# Patient Record
Sex: Male | Born: 1935 | Race: White | Hispanic: No | Marital: Married | Smoking: Former smoker
Health system: Southern US, Community
[De-identification: ages and names within clinical notes are randomized; demographics above are authoritative.]

## PROBLEM LIST (undated history)

## (undated) DIAGNOSIS — IMO0001 Reserved for inherently not codable concepts without codable children: Secondary | ICD-10-CM

## (undated) DIAGNOSIS — I4891 Unspecified atrial fibrillation: Secondary | ICD-10-CM

## (undated) DIAGNOSIS — F419 Anxiety disorder, unspecified: Secondary | ICD-10-CM

## (undated) DIAGNOSIS — I739 Peripheral vascular disease, unspecified: Secondary | ICD-10-CM

## (undated) DIAGNOSIS — E119 Type 2 diabetes mellitus without complications: Secondary | ICD-10-CM

## (undated) DIAGNOSIS — I214 Non-ST elevation (NSTEMI) myocardial infarction: Secondary | ICD-10-CM

## (undated) DIAGNOSIS — N400 Enlarged prostate without lower urinary tract symptoms: Secondary | ICD-10-CM

## (undated) DIAGNOSIS — I472 Ventricular tachycardia, unspecified: Secondary | ICD-10-CM

## (undated) DIAGNOSIS — I2510 Atherosclerotic heart disease of native coronary artery without angina pectoris: Secondary | ICD-10-CM

## (undated) DIAGNOSIS — Z95810 Presence of automatic (implantable) cardiac defibrillator: Secondary | ICD-10-CM

## (undated) DIAGNOSIS — I5020 Unspecified systolic (congestive) heart failure: Secondary | ICD-10-CM

## (undated) DIAGNOSIS — C3490 Malignant neoplasm of unspecified part of unspecified bronchus or lung: Secondary | ICD-10-CM

## (undated) DIAGNOSIS — M25519 Pain in unspecified shoulder: Secondary | ICD-10-CM

## (undated) DIAGNOSIS — C3411 Malignant neoplasm of upper lobe, right bronchus or lung: Secondary | ICD-10-CM

## (undated) DIAGNOSIS — I10 Essential (primary) hypertension: Secondary | ICD-10-CM

## (undated) DIAGNOSIS — Z9289 Personal history of other medical treatment: Secondary | ICD-10-CM

## (undated) DIAGNOSIS — I429 Cardiomyopathy, unspecified: Secondary | ICD-10-CM

## (undated) HISTORY — PX: CORONARY ARTERY BYPASS GRAFT: SHX141

## (undated) HISTORY — PX: TONSILLECTOMY: SUR1361

## (undated) HISTORY — PX: ABDOMINAL AORTIC ANEURYSM REPAIR: SUR1152

## (undated) HISTORY — PX: CARDIAC CATHETERIZATION: SHX172

## (undated) HISTORY — DX: Malignant neoplasm of upper lobe, right bronchus or lung: C34.11

## (undated) HISTORY — PX: CATARACT EXTRACTION: SUR2

---

## 2000-08-19 ENCOUNTER — Encounter: Payer: Self-pay | Admitting: Vascular Surgery

## 2000-08-20 ENCOUNTER — Ambulatory Visit: Admission: RE | Admit: 2000-08-20 | Discharge: 2000-08-20 | Payer: Self-pay | Admitting: Vascular Surgery

## 2000-08-25 ENCOUNTER — Encounter (INDEPENDENT_AMBULATORY_CARE_PROVIDER_SITE_OTHER): Payer: Self-pay | Admitting: Specialist

## 2000-08-25 ENCOUNTER — Inpatient Hospital Stay: Admission: RE | Admit: 2000-08-25 | Discharge: 2000-08-29 | Payer: Self-pay | Admitting: Vascular Surgery

## 2000-08-25 ENCOUNTER — Encounter: Payer: Self-pay | Admitting: Vascular Surgery

## 2000-08-26 ENCOUNTER — Encounter: Payer: Self-pay | Admitting: Vascular Surgery

## 2000-08-27 ENCOUNTER — Encounter: Payer: Self-pay | Admitting: Vascular Surgery

## 2000-08-29 ENCOUNTER — Encounter: Payer: Self-pay | Admitting: Vascular Surgery

## 2009-10-05 ENCOUNTER — Ambulatory Visit (HOSPITAL_COMMUNITY): Admission: RE | Admit: 2009-10-05 | Discharge: 2009-10-05 | Payer: Self-pay | Admitting: Ophthalmology

## 2009-10-18 ENCOUNTER — Ambulatory Visit (HOSPITAL_COMMUNITY): Admission: RE | Admit: 2009-10-18 | Discharge: 2009-10-18 | Payer: Self-pay | Admitting: Ophthalmology

## 2011-03-20 LAB — GLUCOSE, CAPILLARY: Glucose-Capillary: 170 mg/dL — ABNORMAL HIGH (ref 70–99)

## 2011-03-21 LAB — BASIC METABOLIC PANEL
BUN: 18 mg/dL (ref 6–23)
CO2: 27 mEq/L (ref 19–32)
Chloride: 104 mEq/L (ref 96–112)
Creatinine, Ser: 0.94 mg/dL (ref 0.4–1.5)
Glucose, Bld: 183 mg/dL — ABNORMAL HIGH (ref 70–99)

## 2011-03-21 LAB — GLUCOSE, CAPILLARY: Glucose-Capillary: 170 mg/dL — ABNORMAL HIGH (ref 70–99)

## 2011-03-21 LAB — HEMOGLOBIN AND HEMATOCRIT, BLOOD: Hemoglobin: 16.5 g/dL (ref 13.0–17.0)

## 2011-05-03 NOTE — Op Note (Signed)
[facility]  Patient:    George Pollard, George Pollard                    MRN: [id number] Proc. Date: 08/20/00 Adm. Date:  [id number] Attending:  Alyson Locket CC:         Thomes Dinning, M.D., [address] [place], [place] [address]                           Operative Report  PREOPERATIVE DIAGNOSES:  Abdominal aortic aneurysm.  POSTOPERATIVE DIAGNOSES:  Abdominal aortic aneurysm.  PROCEDURE:  Aortogram with bilateral lower extremity runoff.  SURGEON:  Dr. Tawanna Cooler Early.  ANESTHESIA:  1% lidocaine local, 2 mg IV Versed sedation.  COMPLICATIONS:  None.  DISPOSITION:  To holding area stable.  DESCRIPTION OF PROCEDURE:  The patient was taken to the [facility], placed in supine position where the area of both groins were prepped and draped in the usual sterile fashion. Using local anesthesia and a single wall stick, the right common femoral artery was entered and a guidewire was passed up to the level of the suprarenal aorta. A 5 French sheath was passed over the guidewire and a pigtail catheter was passed to the level of the suprarenal aorta. AP projections at this level revealed widely patent main left and right renal arteries. The patient also had a large lower pole renal artery arising from the aneurysm. There was a short infrarenal neck. Lateral projection at this same level confirmed these findings with a short infrarenal neck. The pigtail catheter was then withdrawn down to the aortic bifurcation. Again the large aneurysm was seen. There was ectasia of the common iliac arteries bilaterally with no aneurysms seen by arteriogram. The pigtail catheter was then exchanged for am IMA catheter. The left common iliac artery was selectively catheterized and the left iliac injection was undertaken. This again revealed ectasia of the common iliac artery on the left. There was a normal external iliac artery on the left and normal common, superficial femoral and  profunda femoris arteries. There was mild irregularity at the adductor canal on the left at the superficial femoral artery. The popliteal artery was widely patent and there was normal 3-vessel runoff on the left. The IMA catheter was then removed and runoff was obtained in the right leg via the right femoral sheath. This revealed widely patent external iliac, femoral and common femoral artery. There was a widely patent profunda and superficial femoral artery. The popliteal artery was widely patent and there was normal 3-vessel runoff on the right as well. The patient tolerated the procedure without difficulty. The sheath was removed and pressure was held.  FINDINGS: 1. Infrarenal abdominal aortic aneurysm. 2. 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 3. Common iliac artery ectasia bilaterally. 4. Widely patent external iliac, common femoral, superficial femoral and    3 vessel runoff bilaterally.  DD:  08/20/00 TD:  08/20/00 Job: [id number]

## 2011-05-03 NOTE — Discharge Summary (Signed)
[facility]  Patient:    George Pollard, George Pollard                    MRN: [id number] Adm. Date:  [id number] Disc. Date: [id number] Attending:  Alyson Locket Dictator:   Loura Pardon, P.A. CC:         Kirstie Peri, M.D.   Discharge Summary  DATE OF BIRTH:  [birth year]  FINAL DIAGNOSIS:  Abdominal aortic aneurysm, asymptomatic, measuring 6.2 x 4.8 cm and 9.2 cm long.  SECONDARY DIAGNOSES: 1. History of chronic atrial fibrillation on Coumadin therapy. 2. Coronary artery disease status post coronary artery bypass graft surgery    in 1997. 3. Hypertension. 4. Hyperlipidemia with hyperglyceridemia. 5. Chronic obstructive pulmonary disease. 6. Ejection fraction 50% per Cardiolite August 15, 2000. 7. Adult onset diabetes mellitus, diet controlled. 8. Status post angiogram August 20, 2000. 9. Status post left heart catheterization July 1997.  PROCEDURES: August 25, 2000, resection and grafting of abdominal aortic aneurysm, placement of 18 x 9 mm Dacron Hemashield graft and aorta to bilateral common iliac artery bypass placed with reimplantation of the lower left renal artery.  Attention surgeon, Dr. Gretta Began.  DISCHARGE DISPOSITION:  Mr. George Pollard is a suitable candidate for discharge on postoperative day #4.  He has made splendid progress in the postoperative period.  He was relieved of all supplemental oxygen by postoperative day #3.  He was started on clear liquids on postoperative day #2.  He tolerated that.  He was started on a regular diet on postoperative day #3 which he tolerated.  He had a bowel movement on postoperative day #2, and he continued to have regular bowel movements throughout the postoperative period then.  His abdomen was soft, nondistended.  He was ambulating independently.  He did have a transient burst of atrial fibrillation on postoperative day #1.  This converted when he was placed on IV Cardizem and then started on  his Toprol XL 50 mg daily dosage.  His Coumadin was restarted when he was taken oral diet well on postoperative day #3 as were his other medications.  His incision is healing nicely without any evidence of erythema or drainage.  Ankle-brachial indices were performed on postoperative day #1 showing 0.187 on the right and greater than 1.0 on the left.  DISCHARGE MEDICATIONS: 1. Tylox 1 to 2 tablets p.o. q.4h. p.r.n. pain. 2. Toprol XL 50 mg daily. 3. Hytrin 1 mg at bedtime daily. 4. Zocor 10 mg at bedtime daily. 5. Coumadin 7.5 mg Tuesday, Thursday, Saturday, and Sunday; 10 mg    Monday, Wednesday, Friday.  DISCHARGE ACTIVITY:  Ambulation as tolerated.  DISCHARGE DIET:  Low sodium, low cholesterol.  WOUND CARE:  He may shower daily, keeping his incision clean and dry.  He is asked not to lift any weight more than 10 pounds nor to drive until seen by Dr. Arbie Cookey.  He will have an office visit with [facility] Friday, September 21, at 10:30 in the morning to have staples removed.  At that time, and office visit with Dr. Gretta Began will be scheduled.  BRIEF HISTORY:  George Pollard is a 77 year old male with a known history of abdominal aortic aneurysm.  This was found in 1997 at the time of his coronary artery bypass graft surgery.  Last study in August 2001 revealed an increase in his abdominal aortic aneurysm to 6.2 x 4.8 cm and a length of 9.2 cm. George Pollard has  remained asymptomatic.  He underwent a CT for further evaluation of the aorta confirming the findings.  He was not an adequate candidate for stent graft repair, and he does have a large accessory renal artery just above the beginning of the aneurysm.  He underwent abdominal angiogram on September 5 in preparation for the surgery.  HOSPITAL COURSE:  George Pollard presented to [facility] on September 10.  He underwent resection and grafting of his abdominal aortic  aneurysm with placement of 18 x 9 mm aorta to bilateral common iliac artery bypass.  He also had reimplantation of his lower left renal artery.  Dr. Tawanna Cooler Early was the surgeon.  The patient tolerated the procedure well and was extubated on the day of surgery.  He had good output, strong pedal pulses.  On postoperative day #1, he experienced cardiac dysrhythmia with atrial fibrillation with rates in 100 to 110 range.  He was started on a Cardizem drip and converted to sinus rhythm.  He was started on Toprol XL when he was taking oral nourishment, and the Cardizem drip was discontinued on postoperative day #3.  Ankle-brachial indices were taken on postoperative day #1 with findings as dictated above.  On postoperative day #2, he had his first bowel movement.  He was achieving 97% oxygen saturation on 4 liters of nasal cannula.  His postoperative laboratories were within normal limits.  Hemoglobin was 14.3, hematocrit 48%. He was started on clear liquids as well as Toprol XL.  On postoperative day #3, he was started on a regular diet.  He did not require any oxygen supplementation and was achieving 96% oxygen saturation on room air.  Hemoglobin 13.7, hematocrit 39%.  As mentioned above, he was started on a regular diet which he tolerated.  On postoperative day #4, he was ambulating well, not requiring oxygen.  He continued to have daily bowel movements and in essence asked to go home.  He was judged a suitable candidate to go home.  His pain was controlled well with Tylox, and he had had no abdominal discomfort in the postoperative period.  He goes home on the medications and followup as dictated. DD:  08/29/00 TD:  09/01/00 Job: [id number]

## 2011-05-03 NOTE — H&P (Signed)
[facility]  Patient:    George Pollard, George Pollard                    MRN: [id number] Adm. Date:  [id number] Disc. Date: [id number] Attending:  Alyson Locket Dictator:   Marlowe Kays, P.A.                         History and Physical  DATE OF BIRTH:  28-Feb-1933  CHIEF COMPLAINT:  Abdominal aortic aneurysm.  HISTORY OF PRESENT ILLNESS:  George Pollard is a pleasant 77 year old white married male with known history of abdominal aortic aneurysm since 1997, followed up by ultrasound. In the year 2000, the maximal diameter had reached 5 cm. However, his last study of August 2001 revealed an increase of this abdominal aortic aneurysm to 6.2 x 4.8 cm, and a length of 9.2 cm. The patient remains asymptomatic. He underwent a CT for further evaluation of the aorta which confirmed the findings. He does not have adequate neck length of his aortic neck for stent graft repair and also has a large accessory renal artery just above the beginning of the aneurysm. He underwent abdominal angiogram on August 20, 2000 (no reports are available as of August 22, 2000). The patient is scheduled for surgery on August 25, 2000. He denies any abdominal pain, nausea, vomiting, diarrhea, no hematochezia, melena, fever or chills. No back pain. No constipation, no leg pain or claudication symptoms, no hematemesis, no peripheral edema. No shortness of breath, dyspnea on exertion, paroxysmal nocturnal dyspnea, dysuria or hematuria.  PAST MEDICAL HISTORY: 1. Abdominal aortic aneurysm. 2. History of chronic atrial fibrillation on Coumadin therapy. 3. Coronary artery disease status post CABG. 4. Hypertension. 5. Hypercholesterolemia (high triglyceridemia). 6. Chronic obstructive pulmonary disease. 7. Ejection fraction 50% as per cardiolite test August 15, 2000. 8. Adult onset diabetes mellitus, diet controlled.  PAST SURGICAL HISTORY: 1. Status post angiogram August 20, 2000. 2. Status post ______. 3. Status post CABG x 3 in 1997. 4. Status post cardiac catheterization July 1997.  MEDICATIONS: 1. Coumadin 7.5 mg on Tuesday, Thursday, Saturday and Sunday and 10 mg on    Monday, Wednesday, and Friday. 2. Toprol XL 50 mg p.o. q.d. 3. Hytrin 1 mg p.o. q.d. 4. Zocor 10 mg p.o. q.d.  ALLERGIES:  No known drug allergies.  REVIEW OF SYSTEMS:  See HPI and past medical history for significant positives otherwise the patient denies any kidney disease, asthma, TIA, CVA, amaurosis fugax, syncope, presyncope. No angina, history of PE or DVT. No GERD symptoms, CHF, hematuria or dysuria.  FAMILY HISTORY:  Mother died, had a history of MI and diabetes. Father dead with a history of diabetes and Alzheimers. One sister deceased with a history of diabetes.  SOCIAL HISTORY:  Married, 4 children, he is retired. He quit 1 pack a day of cigarette abuse for 4 years. He denies any alcohol intake.  PHYSICAL EXAMINATION:  GENERAL:  Well-developed, moderately obese white male in no acute distress, alert and oriented x 3.  VITAL SIGNS:  Blood pressure 140/90, pulse 68 irregular, respirations 18.  HEENT:  Normocephalic, atraumatic. PERRLA. EOMI. There is very mild cataract formation on funduscopic exam. The patient wears dentures.  NECK:  Supple, no JVD, bruits, thyromegaly or lymphadenopathy.  CHEST:  Symmetrical on inspirations. No wheezes, rhonchi or rales. Respirations nonlabored. No axillary or supraclavicular lymphadenopathy.  CARDIOVASCULAR:  Irregularly irregular rate and rhythm without murmurs, rubs or gallops.  ABDOMEN:  Soft, nontender. Bowel sounds x 4. No hepatosplenomegaly, masses palpable or abdominal bruits. The abdomen is significantly obese, which may mask some abdominal bruits or masses.  GU/RECTAL:  Deferred.  EXTREMITIES:  No cyanosis, clubbing or edema.  SKIN:  Shows no ulcerations, normal hair pattern, warm temperature.  PERIPHERAL  PULSES:  Carotids 2+ bilaterally without bruits. Femoral 2+ bilaterally without bruits. Popliteal, dorsalis pedis and posterior tibialis 2+ bilaterally.  NEUROLOGIC:  Grossly normal. Normal gait. DTRs 2+ bilaterally. Muscle strength 5/5.  ASSESSMENT/PLAN:  A 77 year old white male with known history of abdominal aortic aneurysm since 1997, who will undergo ______ of the abdominal aortic aneurysm by Dr. Arbie Cookey. Dr. Arbie Cookey has seen and evaluated this patient prior to the admission and has explained the risks and benefits involved in this procedure and the patient has agreed to continue. DD:  08/22/00 TD:  08/22/00 Job: [id number]

## 2012-10-19 ENCOUNTER — Inpatient Hospital Stay (HOSPITAL_COMMUNITY)
Admission: EM | Admit: 2012-10-19 | Discharge: 2012-10-29 | DRG: 246 | Disposition: A | Discharge status: Home / Self Care | Payer: Medicare Other | Source: Other Acute Inpatient Hospital | Attending: Cardiology | Admitting: Cardiology

## 2012-10-19 ENCOUNTER — Encounter (HOSPITAL_COMMUNITY): Payer: Self-pay | Admitting: Cardiology

## 2012-10-19 DIAGNOSIS — I2589 Other forms of chronic ischemic heart disease: Secondary | ICD-10-CM | POA: Diagnosis present

## 2012-10-19 DIAGNOSIS — Y9210 Unspecified residential institution as the place of occurrence of the external cause: Secondary | ICD-10-CM | POA: Diagnosis not present

## 2012-10-19 DIAGNOSIS — I25810 Atherosclerosis of coronary artery bypass graft(s) without angina pectoris: Secondary | ICD-10-CM | POA: Diagnosis present

## 2012-10-19 DIAGNOSIS — E119 Type 2 diabetes mellitus without complications: Secondary | ICD-10-CM | POA: Diagnosis present

## 2012-10-19 DIAGNOSIS — I4891 Unspecified atrial fibrillation: Secondary | ICD-10-CM | POA: Diagnosis present

## 2012-10-19 DIAGNOSIS — I2582 Chronic total occlusion of coronary artery: Secondary | ICD-10-CM | POA: Diagnosis present

## 2012-10-19 DIAGNOSIS — Z955 Presence of coronary angioplasty implant and graft: Secondary | ICD-10-CM

## 2012-10-19 DIAGNOSIS — I10 Essential (primary) hypertension: Secondary | ICD-10-CM | POA: Diagnosis present

## 2012-10-19 DIAGNOSIS — N400 Enlarged prostate without lower urinary tract symptoms: Secondary | ICD-10-CM | POA: Diagnosis present

## 2012-10-19 DIAGNOSIS — Y849 Medical procedure, unspecified as the cause of abnormal reaction of the patient, or of later complication, without mention of misadventure at the time of the procedure: Secondary | ICD-10-CM | POA: Diagnosis not present

## 2012-10-19 DIAGNOSIS — IMO0002 Reserved for concepts with insufficient information to code with codable children: Secondary | ICD-10-CM | POA: Diagnosis not present

## 2012-10-19 DIAGNOSIS — Z7901 Long term (current) use of anticoagulants: Secondary | ICD-10-CM

## 2012-10-19 DIAGNOSIS — Z79899 Other long term (current) drug therapy: Secondary | ICD-10-CM

## 2012-10-19 DIAGNOSIS — I5023 Acute on chronic systolic (congestive) heart failure: Secondary | ICD-10-CM | POA: Diagnosis not present

## 2012-10-19 DIAGNOSIS — I214 Non-ST elevation (NSTEMI) myocardial infarction: Secondary | ICD-10-CM

## 2012-10-19 DIAGNOSIS — I2510 Atherosclerotic heart disease of native coronary artery without angina pectoris: Secondary | ICD-10-CM | POA: Diagnosis present

## 2012-10-19 DIAGNOSIS — I255 Ischemic cardiomyopathy: Secondary | ICD-10-CM | POA: Diagnosis present

## 2012-10-19 DIAGNOSIS — I509 Heart failure, unspecified: Secondary | ICD-10-CM | POA: Diagnosis not present

## 2012-10-19 HISTORY — DX: Essential (primary) hypertension: I10

## 2012-10-19 HISTORY — DX: Benign prostatic hyperplasia without lower urinary tract symptoms: N40.0

## 2012-10-19 HISTORY — DX: Non-ST elevation (NSTEMI) myocardial infarction: I21.4

## 2012-10-19 HISTORY — DX: Unspecified atrial fibrillation: I48.91

## 2012-10-19 HISTORY — DX: Peripheral vascular disease, unspecified: I73.9

## 2012-10-19 HISTORY — DX: Unspecified systolic (congestive) heart failure: I50.20

## 2012-10-19 HISTORY — DX: Atherosclerotic heart disease of native coronary artery without angina pectoris: I25.10

## 2012-10-19 HISTORY — DX: Type 2 diabetes mellitus without complications: E11.9

## 2012-10-19 MED ORDER — SODIUM CHLORIDE 0.9 % IV SOLN
INTRAVENOUS | Status: DC
  Administered 2012-10-20: 18:00:00 via INTRAVENOUS

## 2012-10-19 MED ORDER — TERAZOSIN HCL 1 MG PO CAPS
1.0000 mg | ORAL_CAPSULE | Freq: Every day | ORAL | Status: DC
  Administered 2012-10-20 – 2012-10-24 (×6): 1 mg via ORAL
  Filled 2012-10-19 (×7): qty 1

## 2012-10-19 MED ORDER — ACETAMINOPHEN 325 MG PO TABS
650.0000 mg | ORAL_TABLET | ORAL | Status: DC | PRN
  Administered 2012-10-20 – 2012-10-21 (×4): 650 mg via ORAL
  Filled 2012-10-19 (×4): qty 2

## 2012-10-19 MED ORDER — INSULIN ASPART 100 UNIT/ML ~~LOC~~ SOLN
0.0000 [IU] | SUBCUTANEOUS | Status: DC
  Administered 2012-10-20 (×2): 1 [IU] via SUBCUTANEOUS
  Administered 2012-10-20 (×3): 2 [IU] via SUBCUTANEOUS
  Administered 2012-10-21: 4 [IU] via SUBCUTANEOUS

## 2012-10-19 MED ORDER — ONDANSETRON HCL 4 MG/2ML IJ SOLN: 4.0000 mg | Freq: Four times a day (QID) | INTRAMUSCULAR | Status: DC | PRN

## 2012-10-19 MED ORDER — SODIUM CHLORIDE 0.9 % IV SOLN: 250.0000 mL | INTRAVENOUS | Status: DC | PRN

## 2012-10-19 MED ORDER — ASPIRIN 81 MG PO CHEW
324.0000 mg | CHEWABLE_TABLET | ORAL | Status: AC
  Administered 2012-10-20: 324 mg via ORAL
  Filled 2012-10-19: qty 4

## 2012-10-19 MED ORDER — SODIUM CHLORIDE 0.9 % IJ SOLN
3.0000 mL | Freq: Two times a day (BID) | INTRAMUSCULAR | Status: DC
  Administered 2012-10-20 – 2012-10-21 (×4): 3 mL via INTRAVENOUS

## 2012-10-19 MED ORDER — METOPROLOL TARTRATE 25 MG PO TABS
25.0000 mg | ORAL_TABLET | Freq: Two times a day (BID) | ORAL | Status: DC
  Administered 2012-10-20 – 2012-10-21 (×4): 25 mg via ORAL
  Filled 2012-10-19 (×6): qty 1

## 2012-10-19 MED ORDER — ASPIRIN EC 81 MG PO TBEC
81.0000 mg | DELAYED_RELEASE_TABLET | Freq: Every day | ORAL | Status: DC
  Administered 2012-10-21 – 2012-10-29 (×8): 81 mg via ORAL
  Filled 2012-10-19 (×9): qty 1

## 2012-10-19 MED ORDER — ATORVASTATIN CALCIUM 80 MG PO TABS
80.0000 mg | ORAL_TABLET | Freq: Every day | ORAL | Status: DC
  Administered 2012-10-20 – 2012-10-28 (×9): 80 mg via ORAL
  Filled 2012-10-19 (×10): qty 1

## 2012-10-19 MED ORDER — NITROGLYCERIN 0.4 MG SL SUBL: 0.4000 mg | SUBLINGUAL_TABLET | SUBLINGUAL | Status: DC | PRN

## 2012-10-19 MED ORDER — SODIUM CHLORIDE 0.9 % IJ SOLN: 3.0000 mL | INTRAMUSCULAR | Status: DC | PRN

## 2012-10-19 NOTE — Progress Notes (Signed)
ANTICOAGULATION CONSULT NOTE - Initial Consult  Pharmacy Consult for heparin Indication: chest pain/ACS  No Known Allergies  Patient Measurements: Height: 5\' 10"  (177.8 cm) Weight: 235 lb 3.7 oz (106.7 kg) IBW/kg (Calculated) : 73  Heparin Dosing Weight: 96 kg  Vital Signs: Temp: 99.7 F (37.6 C) (11/04 2130) Temp src: Oral (11/04 2130) BP: 130/81 mmHg (11/04 2200) Pulse Rate: 114  (11/04 2200)  Labs: No results found for this basename: HGB:2,HCT:3,PLT:3,APTT:3,LABPROT:3,INR:3,HEPARINUNFRC:3,CREATININE:3,CKTOTAL:3,CKMB:3,TROPONINI:3 in the last 72 hours  Estimated Creatinine Clearance: 80.5 ml/min (by C-G formula based on Cr of 0.94).   Medical History: Past Medical History  Diagnosis Date  . CAD (coronary artery disease)   . Atrial fibrillation   . Hypertension   . BPH (benign prostatic hyperplasia)     Medications:  Scheduled:    . aspirin  324 mg Oral Pre-Cath  . aspirin EC  81 mg Oral Daily  . atorvastatin  80 mg Oral q1800  . insulin aspart  0-9 Units Subcutaneous Q4H  . metoprolol tartrate  25 mg Oral BID  . sodium chloride  3 mL Intravenous Q12H  . terazosin  1 mg Oral QHS    Assessment: 78 yo male with h/o atrial fibrillation transferred from [facility] with NSTEMI per [facility] cardiology consult. Patient was on Coumadin and INR at [facility] was 2. Patient received phytonadione 2.5mg  orally. Additional baseline labs at [facility] include, SrCr 0.96, Hgb 15.4, Plt 153. Pharmacy consulted to manage heparin. Heparin was started at [facility], and is infusing at 1400 units/hr.   Goal of Therapy:  Heparin level 0.3-0.7 units/ml Monitor platelets by anticoagulation protocol: Yes   Plan:  1. Continue IV heparin at 1400 units/hr. 2. Heparin level at 01:00. 3. Daily CBC, heparin level.   Emeline Gins 10/19/2012,11:23 PM

## 2012-10-19 NOTE — H&P (Signed)
CARDIOLOGY ADMISSION NOTE  Patient ID: George Pollard MRN: [id number] DOB/AGE: February 26, 1933 78 y.o.  Admit date: 10/19/2012 Primary Physician   Dr. Sherryll Burger Primary Cardiologist   Dr. Andee Lineman Chief Complaint    Chest pain.    HPI:    The patient has a history of CAD with CABG in 1997.  He also has trial fibrillation on chronic warfarin therapy.  The patient was admitted to [facility] earlier this am with chest pain.  He was not found to have acute ST changes.  He was in afib with RVR and was treated with Cardizem.  He has some mild lingering chest pain and a second set of troponin was elevated at 34.  Dr. Andee Lineman was consulted and is transferring the patient to [facility] for further evaluation and catheterization.  Prior to transfer bedside echo demonstrated an EF of 35% with severe posterior hypokinesis.  He has been treated with IV heparin and vitamin K given his therapeutic INR.   The patient said the pain started this a.m. It was right of his sternum and radiated across his chest.  Felt like he needed to burp but it wasn't like any previous discomfort that he had before. It was 9/10 at its peak. It lasted for greater than an hour. He wasn't short of breath but he tried to lie down to take deep breaths to improve the discomfort. He did have improvement in the emergency room after treatment with Cardizem and aspirin. He had some mild lingering residual soreness throughout the day but none of the acute discomfort.  Prior to admission he had no recent symptoms other than today. 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 The patient denies any new symptoms such as neck or arm discomfort. There has been no new shortness of breath, PND or orthopnea. There have been no reported palpitations, presyncope or syncope.    Past Medical History  Diagnosis Date  . CAD (coronary artery disease)   . Atrial fibrillation   . Hypertension   . BPH (benign prostatic hyperplasia)     Past Surgical  History  Procedure Date  . Coronary artery bypass graft     1997  . Abdominal aortic aneurysm repair     08/26/2000    Allergies:  None  Medications: (Home) Warfarin Terazosin and 1 mg daily Metoprolol 50 mg daily   History   Social History  . Marital Status: Married    Spouse Name: N/A    Number of Children: N/A  . Years of Education: N/A   Occupational History  . Not on file.   Social History Main Topics  . Smoking status: Not on file  . Smokeless tobacco: Not on file  . Alcohol Use: Not on file  . Drug Use: Not on file  . Sexually Active: Not on file   Other Topics Concern  . Not on file   Social History Narrative  . No narrative on file    Family History  Problem Relation Age of Onset  . Valvular heart disease Daughter     MVP repair    ROS:  Decreased urinary stream.  Otherwise as stated in the HPI and negative for all other systems.   Physical Exam: Blood pressure 126/74, pulse 113, temperature 99.7 F (37.6 C), temperature source Oral, resp. rate 19, height 5\' 10"  (1.778 m), weight 235 lb 3.7 oz (106.7 kg), SpO2 97.00%.  GENERAL:  Well appearing HEENT:  Pupils equal round and reactive, fundi  not visualized, oral mucosa unremarkable NECK:  No jugular venous distention, waveform within normal limits, carotid upstroke brisk and symmetric, no bruits, no thyromegaly LYMPHATICS:  No cervical, inguinal adenopathy LUNGS:  Clear to auscultation bilaterally BACK:  No CVA tenderness CHEST:  Well healed sternotomy scar. HEART:  PMI not displaced or sustained,S1 and S2 within normal limits, no S3, no S4, no clicks, no rubs, no murmurs ABD:  Flat, positive bowel sounds normal in frequency in pitch, no bruits, no rebound, no guarding, no midline pulsatile mass, no hepatomegaly, no splenomegaly, abdominal scar EXT:  2 plus pulses throughout, no edema, no cyanosis no clubbing SKIN:  No rashes no nodules NEURO:  Cranial nerves II through XII grossly intact, motor  grossly intact throughout PSYCH:  Cognitively intact, oriented to person place and time  Labs: Sodium 1S and 4.2, creatinine 0.96, BUN 19, CK/MB(1)98/42, CK/MB (2) 1693/210.6, troponin (1) 0.04, troponin (2) 34.53, BNP 73, INR 2.0, WBC 7.0, hemoglobin 15.4, platelets 153   Radiology:  Chest x-ray: Cardiomegaly with central congestion. Interstitial prominence there chronic changes and/or interstitial edema.  EKG:  Atrial fibrillation, rate 88, left axis deviation, probable old inferior infarct, QTC slightly prolonged, no acute ST-T wave changes.  ASSESSMENT AND PLAN:    NQWMI: Continue heparin and ASA.  Cath in AM pending his INR.  Atrial fibrillation: Resume warfarin at discharge (the patient does not want to switch to another medication.)  Cardiomyopathy: Reduced EF.  However, he currently appears to be euvolemic.  We will continue his beta blocker and start low-dose ACE inhibitor during this admission.  Hyperglycemia This is a new diagnosis. He did have elevated blood sugars in the past medically managed but diabetes resolved with weight loss. We will follow this and consider oral therapy upon discharge.  AAA repair: Routine followup this per Dr. Andee Lineman.    SignedRollene Rotunda 10/19/2012, 10:03 PM   \

## 2012-10-20 ENCOUNTER — Encounter (HOSPITAL_COMMUNITY): Payer: Self-pay | Admitting: *Deleted

## 2012-10-20 ENCOUNTER — Inpatient Hospital Stay (HOSPITAL_COMMUNITY): Payer: Medicare Other

## 2012-10-20 DIAGNOSIS — I2589 Other forms of chronic ischemic heart disease: Secondary | ICD-10-CM

## 2012-10-20 DIAGNOSIS — I4891 Unspecified atrial fibrillation: Secondary | ICD-10-CM

## 2012-10-20 DIAGNOSIS — I255 Ischemic cardiomyopathy: Secondary | ICD-10-CM | POA: Diagnosis present

## 2012-10-20 DIAGNOSIS — I25810 Atherosclerosis of coronary artery bypass graft(s) without angina pectoris: Secondary | ICD-10-CM

## 2012-10-20 DIAGNOSIS — I214 Non-ST elevation (NSTEMI) myocardial infarction: Secondary | ICD-10-CM

## 2012-10-20 DIAGNOSIS — I517 Cardiomegaly: Secondary | ICD-10-CM

## 2012-10-20 LAB — GLUCOSE, CAPILLARY
Glucose-Capillary: 125 mg/dL — ABNORMAL HIGH (ref 70–99)
Glucose-Capillary: 163 mg/dL — ABNORMAL HIGH (ref 70–99)
Glucose-Capillary: 177 mg/dL — ABNORMAL HIGH (ref 70–99)
Glucose-Capillary: 185 mg/dL — ABNORMAL HIGH (ref 70–99)

## 2012-10-20 LAB — CBC
HCT: 43.6 % (ref 39.0–52.0)
Hemoglobin: 14.9 g/dL (ref 13.0–17.0)
MCHC: 34.2 g/dL (ref 30.0–36.0)
RBC: 4.93 MIL/uL (ref 4.22–5.81)

## 2012-10-20 LAB — HEPARIN LEVEL (UNFRACTIONATED): Heparin Unfractionated: 0.19 IU/mL — ABNORMAL LOW (ref 0.30–0.70)

## 2012-10-20 LAB — BASIC METABOLIC PANEL
BUN: 15 mg/dL (ref 6–23)
CO2: 24 mEq/L (ref 19–32)
Chloride: 101 mEq/L (ref 96–112)
Glucose, Bld: 173 mg/dL — ABNORMAL HIGH (ref 70–99)
Potassium: 4 mEq/L (ref 3.5–5.1)
Sodium: 135 mEq/L (ref 135–145)

## 2012-10-20 LAB — LIPID PANEL
LDL Cholesterol: 95 mg/dL (ref 0–99)
Triglycerides: 119 mg/dL (ref <150)

## 2012-10-20 LAB — HEMOGLOBIN A1C
Hgb A1c MFr Bld: 6.9 % — ABNORMAL HIGH (ref <5.7)
Mean Plasma Glucose: 151 mg/dL — ABNORMAL HIGH (ref <117)

## 2012-10-20 LAB — PROTIME-INR
INR: 2.06 — ABNORMAL HIGH (ref 0.00–1.49)
Prothrombin Time: 20.6 seconds — ABNORMAL HIGH (ref 11.6–15.2)

## 2012-10-20 LAB — CK TOTAL AND CKMB (NOT AT ARMC): CK, MB: 75 ng/mL (ref 0.3–4.0)

## 2012-10-20 LAB — TROPONIN I
Troponin I: >20 ng/mL (ref <0.30)
Troponin I: >20 ng/mL (ref <0.30)

## 2012-10-20 MED ORDER — NITROGLYCERIN 2 % TD OINT
1.0000 [in_us] | TOPICAL_OINTMENT | Freq: Four times a day (QID) | TRANSDERMAL | Status: DC
  Administered 2012-10-20 (×4): 1 [in_us] via TOPICAL
  Filled 2012-10-20: qty 30

## 2012-10-20 MED ORDER — FUROSEMIDE 10 MG/ML IJ SOLN
INTRAMUSCULAR | Status: AC
  Filled 2012-10-20: qty 8

## 2012-10-20 MED ORDER — HEPARIN (PORCINE) IN NACL 100-0.45 UNIT/ML-% IJ SOLN
1900.0000 [IU]/h | INTRAMUSCULAR | Status: DC
  Administered 2012-10-21: 1700 [IU]/h via INTRAVENOUS
  Filled 2012-10-20 (×3): qty 250

## 2012-10-20 MED ORDER — FUROSEMIDE 10 MG/ML IJ SOLN
80.0000 mg | Freq: Two times a day (BID) | INTRAMUSCULAR | Status: DC
  Administered 2012-10-20 – 2012-10-22 (×4): 80 mg via INTRAVENOUS
  Filled 2012-10-20 (×5): qty 8

## 2012-10-20 MED FILL — Heparin Sodium (Porcine) 100 Unt/ML in Sodium Chloride 0.45%: INTRAMUSCULAR | Qty: 250 | Status: AC

## 2012-10-20 NOTE — Progress Notes (Signed)
ANTICOAGULATION CONSULT NOTE - Follow Up Consult  Pharmacy Consult for Heparin Indication: chest pain/ACU  No Known Allergies  Patient Measurements: Height: 5\' 10"  (177.8 cm) Weight: 235 lb 3.7 oz (106.7 kg) IBW/kg (Calculated) : 73  Heparin Dosing Weight: 96 kg  Vital Signs: Temp: 98.2 F (36.8 C) (11/05 1130) Temp src: Oral (11/05 1130) BP: 123/74 mmHg (11/05 1130) Pulse Rate: 98  (11/05 1130)  Labs:  Basename 10/20/12 1329 10/20/12 1050 10/20/12 0515 10/20/12 0034 10/20/12 0001  HGB -- -- 14.9 -- --  HCT -- -- 43.6 -- --  PLT -- -- 169 -- --  APTT -- -- -- -- --  LABPROT 20.6* -- 22.4* -- --  INR 1.84* -- 2.06* -- --  HEPARINUNFRC -- -- -- 0.19* --  CREATININE -- -- 0.86 -- --  CKTOTAL -- 854* -- -- --  CKMB -- 75.0* -- -- --  TROPONINI -- >20.00* >20.00* -- >20.00*    Estimated Creatinine Clearance: 88 ml/min (by C-G formula based on Cr of 0.86).   Assessment: 78 yo male with h/o atrial fibrillation transferred from [facility] with NSTEMI per [facility] cardiology consult. Patient was on coumadin pta and INR at [facility] was 2. Patient received phytonadione 2.5mg  orally, coumadin was placed on hold and heparin was started in anticipation of cath. Cath was planned for today but INR is still elevated at 1.84 (goal <1.8 for cath).   He remains on a heparin drip. Level was low this morning and rate was increased. Will get a heparin level now.  CBC and renal function stable. No bleeding reported.  Goal of Therapy:  Heparin level 0.3-0.7 units/ml Monitor platelets by anticoagulation protocol: Yes   Plan:  1. Continue heparin 1700 units/hr 2. Follow up heparin level ordered at 1500  3. Follow up AM INR and cath plans  Juanita Craver PharmD Candidate 10/20/2012,3:29 PM  I agree with Stacey's assessment and plan.  Caedyn Raygoza,PharmD,BCPS 10/20/2012, 4:08 PM

## 2012-10-20 NOTE — Progress Notes (Signed)
NP C Berges notified about pts HR still up and down between the 115-140s not sustained 140s; PO metoprolol given as well as IV lasix due to pt having SOB and difficulty breathing earlier around 1930. Pt states now that his breathing is a lot better and he has actually rolled over to try and sleep. Pt has already diuresed over 1.5 liter since IV lasix given. NP wants to RN continue to monitor. VS stable at this time and pt has no complaints.

## 2012-10-20 NOTE — Progress Notes (Signed)
PM Note:   Pt's INR is 1.84 this afternoon. He is very comfortable with no chest pain or SOB. No arrythmias. BP stable. Will let him eat today and plan cardiac cath in am if INR less than 1.8. He understands this and prefers to wait until the procedure will be safe from a bleeding standpoint. Wife and bedside. Echo in progress at bedside.   MCALHANY,CHRISTOPHER 2:14 PM 10/20/2012

## 2012-10-20 NOTE — Progress Notes (Signed)
SUBJECTIVE: No chest pain or SOB  BP 122/76  Pulse 98  Temp 98.1 F (36.7 C) (Oral)  Resp 15  Ht 5\' 10"  (1.778 m)  Wt 235 lb 3.7 oz (106.7 kg)  BMI 33.75 kg/m2  SpO2 98%  Intake/Output Summary (Last 24 hours) at 10/20/12 1022 Last data filed at 10/20/12 0700  Gross per 24 hour  Intake    454 ml  Output    500 ml  Net    -46 ml    PHYSICAL EXAM General: Well developed, well nourished, in no acute distress. Alert and oriented x 3.  Psych:  Good affect, responds appropriately Neck: No JVD. No masses noted.  Lungs: Clear bilaterally with no wheezes or rhonci noted.  Heart: Irregular  Abdomen: Bowel sounds are present. Soft, non-tender.  Extremities: No lower extremity edema.   LABS: Basic Metabolic Panel:  Basename 10/20/12 0515  NA 135  K 4.0  CL 101  CO2 24  GLUCOSE 173*  BUN 15  CREATININE 0.86  CALCIUM 9.0  MG --  PHOS --   CBC:  Basename 10/20/12 0515  WBC 9.5  NEUTROABS --  HGB 14.9  HCT 43.6  MCV 88.4  PLT 169   Cardiac Enzymes:  Basename 10/20/12 0515 10/20/12 0001  CKTOTAL -- --  CKMB -- --  CKMBINDEX -- --  TROPONINI >20.00* >20.00*   Fasting Lipid Panel:  Basename 10/20/12 0515  CHOL 155  HDL 36*  LDLCALC 95  TRIG 098  CHOLHDL 4.3  LDLDIRECT --    Current Meds:    . [COMPLETED] aspirin  324 mg Oral Pre-Cath  . aspirin EC  81 mg Oral Daily  . atorvastatin  80 mg Oral q1800  . insulin aspart  0-9 Units Subcutaneous Q4H  . metoprolol tartrate  25 mg Oral BID  . nitroGLYCERIN  1 inch Topical Q6H  . sodium chloride  3 mL Intravenous Q12H  . terazosin  1 mg Oral QHS     ASSESSMENT AND PLAN: 78 yo WM with history of CAD with CABG in 1997, atrial fibrillation on chronic coumadin therapy transferred yesterday from [facility] where he presented yesterday am with c/o chest pain and found to have NSTEMI. He was not found to have acute ST changes. He was in afib with RVR and was treated with Cardizem. He had some mild  lingering chest pain and a second troponin was elevated at 34. Dr. Andee Lineman was consulted and transferred the patient to [facility] for further evaluation and catheterization. Prior to transfer bedside echo demonstrated an EF of 35% with severe posterior hypokinesis. He has been treated with IV heparin and vitamin K given his therapeutic INR.   1. NSTEMI: Pt is pain free this am. He has no chest pain or SOB. EKG shows no acute changes. Troponin >20. He clearly has an acute coronary syndrome and is now greater than 24 hours out from his event. Cardiac cath has been planned however his INR this am is 2.06. Since he has prior bypass, we will need to approach his cardiac cath from a femoral artery access site. I have discussed the risks of the procedure including bleeding which will be much higher with an INR over 1.8. Since he is pain free and has completed his cardiac event with no injury current on EKG, will delay cath until INR is less than 1.8. Will repeat INR level at 1pm today. Continue ASA, statin, beta blocker.   2. Atrial fibrillation: Rate controlled. He  is on a heparin drip as he has been given Vitamin K for reversal of his therapeutic INR.   3. Cardiomyopathy: Presumed to be ischemic given his presentation with ACS, however, no documented LVEF in our system prior to this admission. Continue beta blocker. Cardiac cath to define CAD. Start Ace-inh before discharge. Will get full echo here as the one mentioned above was a bedside by Dr. Andee Lineman.   4. CAD s/p CABG: as above    George Pollard,George Pollard  11/5/201310:22 AM

## 2012-10-20 NOTE — Progress Notes (Signed)
*  PRELIMINARY RESULTS* Echocardiogram 2D Echocardiogram has been performed.  George Pollard 10/20/2012, 2:26 PM

## 2012-10-20 NOTE — Progress Notes (Signed)
Patient ID: George Pollard, male   DOB: [birth year], 78 y.o.   MRN: [id number]   I have reviewed the record carefully. The patient's INR this morning is 2.0 . His troponin is 20.  I have spoken with Dr.McAlhany. He will see the patient this morning to see if he can proceed with catheterization today.

## 2012-10-20 NOTE — Progress Notes (Signed)
ANTICOAGULATION CONSULT NOTE - Follow Up Consult  Pharmacy Consult for Heparin Indication: chest pain/ACU  No Known Allergies  Patient Measurements: Height: 5\' 10"  (177.8 cm) Weight: 235 lb 3.7 oz (106.7 kg) IBW/kg (Calculated) : 73  Heparin Dosing Weight: 96 kg  Vital Signs: Temp: 97.6 F (36.4 C) (11/05 1600) Temp src: Oral (11/05 1600) BP: 123/74 mmHg (11/05 1130) Pulse Rate: 98  (11/05 1130)  Labs:  Basename 10/20/12 1329 10/20/12 1050 10/20/12 0515 10/20/12 0034 10/20/12 0001  HGB -- -- 14.9 -- --  HCT -- -- 43.6 -- --  PLT -- -- 169 -- --  APTT -- -- -- -- --  LABPROT 20.6* -- 22.4* -- --  INR 1.84* -- 2.06* -- --  HEPARINUNFRC 0.42 -- -- 0.19* --  CREATININE -- -- 0.86 -- --  CKTOTAL -- 854* -- -- --  CKMB -- 75.0* -- -- --  TROPONINI -- >20.00* >20.00* -- >20.00*    Estimated Creatinine Clearance: 88 ml/min (by C-G formula based on Cr of 0.86).   Assessment: 78 yo male with h/o atrial fibrillation transferred from [facility] with NSTEMI per [facility] cardiology consult. Patient was on coumadin pta and INR at [facility] was 2. Patient received phytonadione 2.5mg  orally, coumadin was placed on hold and heparin was started in anticipation of cath. Cath was planned for today but INR is still elevated at 1.84 (goal <1.8 for cath).   He remains on a heparin drip now at goal without any bleeding complications noted.  Will continue at current rate and follow up with am lab.  Goal of Therapy:  Heparin level 0.3-0.7 units/ml Monitor platelets by anticoagulation protocol: Yes   Plan:  1. Continue heparin 1700 units/hr 2. Follow up daily heparin level 3. Follow up AM INR and cath plans  Severiano Gilbert PharmD 10/20/2012,4:49 PM

## 2012-10-20 NOTE — Progress Notes (Signed)
The atrial arrythmia care plan placed on this patient in error.

## 2012-10-20 NOTE — Progress Notes (Signed)
ANTICOAGULATION CONSULT NOTE - Follow-Up Consult  Pharmacy Consult for heparin Indication: chest pain/ACS  No Known Allergies  Patient Measurements: Height: 5\' 10"  (177.8 cm) Weight: 235 lb 3.7 oz (106.7 kg) IBW/kg (Calculated) : 73  Heparin Dosing Weight: 96 kg  Vital Signs: Temp: 98 F (36.7 C) (11/04 2352) Temp src: Oral (11/04 2352) BP: 127/85 mmHg (11/05 0018) Pulse Rate: 114  (11/05 0018)  Labs:  Basename 10/20/12 0034 10/20/12 0001  HGB -- --  HCT -- --  PLT -- --  APTT -- --  LABPROT -- --  INR -- --  HEPARINUNFRC 0.19* --  CREATININE -- --  CKTOTAL -- --  CKMB -- --  TROPONINI -- >20.00*    Estimated Creatinine Clearance: 80.5 ml/min (by C-G formula based on Cr of 0.94).  Assessment: 78 yo male with h/o atrial fibrillation transferred from [facility] with NSTEMI per [facility] cardiology consult. Patient was on Coumadin and INR at [facility] was 2. Patient received phytonadione 2.5mg  orally. Additional baseline labs at [facility] include, SrCr 0.96, Hgb 15.4, Plt 153. Heparin level 0.19 (subtherapeutic). No bleeding noted.  Goal of Therapy:  Heparin level 0.3-0.7 units/ml Monitor platelets by anticoagulation protocol: Yes   Plan:  1. Increase heparin to 1700 units/hr. 2. F/u heparin level at 1000 or f/u post cath  Christoper Fabian, PharmD, BCPS Clinical pharmacist, pager [phone] 10/20/2012,1:39 AM

## 2012-10-20 NOTE — Progress Notes (Signed)
   S: CTSP 2/2 dyspnea.  He was scheduled for cath earlier today however this was cancelled 2/2 supratherapeutic INR.  In the meantime, he has been receiving saline @ 50 mL/hr.  Over the past hour, RN noted some increase in WOB along with rise in HR.  Pt now reports feeling dyspneic just lying in bed.  No chest pain. O:   Filed Vitals:   10/20/12 1600  BP:   Pulse:   Temp: 97.6 F (36.4 C)  Resp:   Pleasant, nad.  Aaox3.  Neck with JVP ~ 12 cm.  Lungs diminished bilat with basilar crackles.  Cor ir, ir, tachy.  Abd protuberant, firm.  Ext no cce.  A/P: 1. Acute on chronic systolic chf/acute pulmonary edema:  In setting of IVF and rapid afib.  Suspect afib rate driven by dyspnea at this time.  He has volume overload on exam.  IVF shut off.  Lasix 80mg  IV given now and ordered bid for the time being.  He is otherwise hemodynamically stable and w/o acute distress.  F/U cxr this evening.  Nicolasa Ducking, NP

## 2012-10-21 ENCOUNTER — Encounter (HOSPITAL_COMMUNITY)
Admission: EM | Disposition: A | Discharge status: Home / Self Care | Payer: Self-pay | Source: Other Acute Inpatient Hospital | Attending: Cardiology

## 2012-10-21 DIAGNOSIS — I214 Non-ST elevation (NSTEMI) myocardial infarction: Secondary | ICD-10-CM

## 2012-10-21 DIAGNOSIS — I2510 Atherosclerotic heart disease of native coronary artery without angina pectoris: Secondary | ICD-10-CM

## 2012-10-21 HISTORY — PX: LEFT HEART CATHETERIZATION WITH CORONARY/GRAFT ANGIOGRAM: SHX5450

## 2012-10-21 LAB — PROTIME-INR: Prothrombin Time: 19.2 seconds — ABNORMAL HIGH (ref 11.6–15.2)

## 2012-10-21 LAB — BASIC METABOLIC PANEL
GFR calc Af Amer: >90 mL/min (ref >90)
GFR calc non Af Amer: 78 mL/min — ABNORMAL LOW (ref >90)
Potassium: 3.8 mEq/L (ref 3.5–5.1)
Sodium: 136 mEq/L (ref 135–145)

## 2012-10-21 LAB — CBC
Hemoglobin: 14.8 g/dL (ref 13.0–17.0)
RBC: 4.87 MIL/uL (ref 4.22–5.81)

## 2012-10-21 LAB — GLUCOSE, CAPILLARY: Glucose-Capillary: 136 mg/dL — ABNORMAL HIGH (ref 70–99)

## 2012-10-21 LAB — HEPARIN LEVEL (UNFRACTIONATED): Heparin Unfractionated: 0.26 IU/mL — ABNORMAL LOW (ref 0.30–0.70)

## 2012-10-21 SURGERY — LEFT HEART CATHETERIZATION WITH CORONARY/GRAFT ANGIOGRAM
Anesthesia: LOCAL

## 2012-10-21 MED ORDER — METOPROLOL TARTRATE 25 MG PO TABS: 25.0000 mg | ORAL_TABLET | Freq: Three times a day (TID) | ORAL | Status: DC

## 2012-10-21 MED ORDER — ASPIRIN 81 MG PO CHEW
162.0000 mg | CHEWABLE_TABLET | Freq: Once | ORAL | Status: AC
  Administered 2012-10-21: 162 mg via ORAL

## 2012-10-21 MED ORDER — FENTANYL CITRATE 0.05 MG/ML IJ SOLN
INTRAMUSCULAR | Status: AC
  Filled 2012-10-21: qty 2

## 2012-10-21 MED ORDER — METOPROLOL TARTRATE 25 MG PO TABS
25.0000 mg | ORAL_TABLET | Freq: Three times a day (TID) | ORAL | Status: DC
  Administered 2012-10-21 (×2): 25 mg via ORAL
  Filled 2012-10-21 (×3): qty 1

## 2012-10-21 MED ORDER — TICAGRELOR 90 MG PO TABS
ORAL_TABLET | ORAL | Status: AC
  Filled 2012-10-21: qty 2

## 2012-10-21 MED ORDER — NITROGLYCERIN 0.2 MG/ML ON CALL CATH LAB
INTRAVENOUS | Status: AC
  Filled 2012-10-21: qty 1

## 2012-10-21 MED ORDER — TICAGRELOR 90 MG PO TABS
90.0000 mg | ORAL_TABLET | Freq: Two times a day (BID) | ORAL | Status: DC
  Administered 2012-10-22: 90 mg via ORAL
  Filled 2012-10-21 (×2): qty 1

## 2012-10-21 MED ORDER — MIDAZOLAM HCL 2 MG/2ML IJ SOLN
INTRAMUSCULAR | Status: AC
  Filled 2012-10-21: qty 2

## 2012-10-21 MED ORDER — SODIUM CHLORIDE 0.9 % IV SOLN: INTRAVENOUS | Status: AC

## 2012-10-21 MED ORDER — HEPARIN (PORCINE) IN NACL 2-0.9 UNIT/ML-% IJ SOLN
INTRAMUSCULAR | Status: AC
  Filled 2012-10-21: qty 1000

## 2012-10-21 MED ORDER — LIDOCAINE HCL (PF) 1 % IJ SOLN
INTRAMUSCULAR | Status: AC
  Filled 2012-10-21: qty 30

## 2012-10-21 MED ORDER — BIVALIRUDIN 250 MG IV SOLR
INTRAVENOUS | Status: AC
  Filled 2012-10-21: qty 250

## 2012-10-21 MED ORDER — ASPIRIN 81 MG PO CHEW
CHEWABLE_TABLET | ORAL | Status: AC
  Filled 2012-10-21: qty 3

## 2012-10-21 NOTE — Progress Notes (Signed)
ANTICOAGULATION CONSULT NOTE - Follow Up Consult  Pharmacy Consult for Heparin Indication: chest pain/ACS  No Known Allergies  Patient Measurements: Height: 5\' 10"  (177.8 cm) Weight: 235 lb 3.7 oz (106.7 kg) IBW/kg (Calculated) : 73  Heparin Dosing Weight: 96 kg  Vital Signs: Temp: 98.2 F (36.8 C) (11/06 0300) Temp src: Oral (11/06 0300) BP: 110/78 mmHg (11/06 0300) Pulse Rate: 84  (11/06 0300)  Labs:  Basename 10/21/12 0458 10/20/12 1329 10/20/12 1050 10/20/12 0515 10/20/12 0034 10/20/12 0001  HGB 14.8 -- -- 14.9 -- --  HCT 43.5 -- -- 43.6 -- --  PLT 168 -- -- 169 -- --  APTT -- -- -- -- -- --  LABPROT 19.2* 20.6* -- 22.4* -- --  INR 1.68* 1.84* -- 2.06* -- --  HEPARINUNFRC 0.26* 0.42 -- -- 0.19* --  CREATININE 0.95 -- -- 0.86 -- --  CKTOTAL -- -- 854* -- -- --  CKMB -- -- 75.0* -- -- --  TROPONINI -- -- >20.00* >20.00* -- >20.00*    Estimated Creatinine Clearance: 79.7 ml/min (by C-G formula based on Cr of 0.95).   Assessment: 78 yo male with h/o atrial fibrillation transferred from [facility] with NSTEMI per [facility] cardiology consult. Patient was on coumadin pta and INR at [facility] was 2. Patient received phytonadione 2.5mg  orally, coumadin was placed on hold and heparin was started in anticipation of cath. Cath planned for today as INR down to 1.68. (goal <1.8 for cath).   He remains on a heparin drip now. Heparin level 0.26 (slightly subtherapeutic). No issues with infusion or bleeding complications noted.   Goal of Therapy:  Heparin level 0.3-0.7 units/ml Monitor platelets by anticoagulation protocol: Yes   Plan:  1. Increase heparin to 1900 units/hr 2. Follow up post cath  Christoper Fabian, PharmD, BCPS Clinical pharmacist, pager [phone] 10/21/2012,6:21 AM

## 2012-10-21 NOTE — H&P (View-Only) (Signed)
  SUBJECTIVE: No chest pain or SOB  BP 122/76  Pulse 98  Temp 98.1 F (36.7 C) (Oral)  Resp 15  Ht 5' 10" (1.778 m)  Wt 235 lb 3.7 oz (106.7 kg)  BMI 33.75 kg/m2  SpO2 98%  Intake/Output Summary (Last 24 hours) at 10/20/12 1022 Last data filed at 10/20/12 0700  Gross per 24 hour  Intake    454 ml  Output    500 ml  Net    -46 ml    PHYSICAL EXAM General: Well developed, well nourished, in no acute distress. Alert and oriented x 3.  Psych:  Good affect, responds appropriately Neck: No JVD. No masses noted.  Lungs: Clear bilaterally with no wheezes or rhonci noted.  Heart: Irregular  Abdomen: Bowel sounds are present. Soft, non-tender.  Extremities: No lower extremity edema.   LABS: Basic Metabolic Panel:  Basename 10/20/12 0515  NA 135  K 4.0  CL 101  CO2 24  GLUCOSE 173*  BUN 15  CREATININE 0.86  CALCIUM 9.0  MG --  PHOS --   CBC:  Basename 10/20/12 0515  WBC 9.5  NEUTROABS --  HGB 14.9  HCT 43.6  MCV 88.4  PLT 169   Cardiac Enzymes:  Basename 10/20/12 0515 10/20/12 0001  CKTOTAL -- --  CKMB -- --  CKMBINDEX -- --  TROPONINI >20.00* >20.00*   Fasting Lipid Panel:  Basename 10/20/12 0515  CHOL 155  HDL 36*  LDLCALC 95  TRIG 119  CHOLHDL 4.3  LDLDIRECT --    Current Meds:    . [COMPLETED] aspirin  324 mg Oral Pre-Cath  . aspirin EC  81 mg Oral Daily  . atorvastatin  80 mg Oral q1800  . insulin aspart  0-9 Units Subcutaneous Q4H  . metoprolol tartrate  25 mg Oral BID  . nitroGLYCERIN  1 inch Topical Q6H  . sodium chloride  3 mL Intravenous Q12H  . terazosin  1 mg Oral QHS     ASSESSMENT AND PLAN: 78 yo WM with history of CAD with CABG in 1997, atrial fibrillation on chronic coumadin therapy transferred yesterday from [facility] where he presented yesterday am with c/o chest pain and found to have NSTEMI. He was not found to have acute ST changes. He was in afib with RVR and was treated with Cardizem. He had some mild  lingering chest pain and a second troponin was elevated at 34. Dr. DeGent was consulted and transferred the patient to [facility] for further evaluation and catheterization. Prior to transfer bedside echo demonstrated an EF of 35% with severe posterior hypokinesis. He has been treated with IV heparin and vitamin K given his therapeutic INR.   1. NSTEMI: Pt is pain free this am. He has no chest pain or SOB. EKG shows no acute changes. Troponin >20. He clearly has an acute coronary syndrome and is now greater than 24 hours out from his event. Cardiac cath has been planned however his INR this am is 2.06. Since he has prior bypass, we will need to approach his cardiac cath from a femoral artery access site. I have discussed the risks of the procedure including bleeding which will be much higher with an INR over 1.8. Since he is pain free and has completed his cardiac event with no injury current on EKG, will delay cath until INR is less than 1.8. Will repeat INR level at 1pm today. Continue ASA, statin, beta blocker.   2. Atrial fibrillation: Rate controlled. He   is on a heparin drip as he has been given Vitamin K for reversal of his therapeutic INR.   3. Cardiomyopathy: Presumed to be ischemic given his presentation with ACS, however, no documented LVEF in our system prior to this admission. Continue beta blocker. Cardiac cath to define CAD. Start Ace-inh before discharge. Will get full echo here as the one mentioned above was a bedside by Dr. DeGent.   4. CAD s/p CABG: as above    MCALHANY,CHRISTOPHER  11/5/201310:22 AM  

## 2012-10-21 NOTE — Interval H&P Note (Signed)
History and Physical Interval Note:  10/21/2012 11:12 AM  George Pollard  has presented today for cardiac cath  with the diagnosis of nstemi  The various methods of treatment have been discussed with the patient and family. After consideration of risks, benefits and other options for treatment, the patient has consented to  Procedure(s) (LRB) with comments: LEFT HEART CATHETERIZATION WITH CORONARY/GRAFT ANGIOGRAM (N/A) as a surgical intervention .  The patient's history has been reviewed, patient examined, no change in status, stable for surgery.  I have reviewed the patient's chart and labs.  Questions were answered to the patient's satisfaction.     Avari Nevares

## 2012-10-21 NOTE — Progress Notes (Addendum)
Called regarding elevated HR - pt's HR has been back and forth all day, particularly when he is starting to undergo procedures. Received his PO metoprolol dose earlier with improvement in HR. Post sheath removal HR jumped back into 160s but patient asymptomatic. Will have MD review telemetry as axis frequently flips. Will change metoprolol to TID dosing. He is completely asymptomatic and feels better than he has "in a while" - no CP or SOB, no lightheadedness/dizziness. HR currently 1-teens to 120s with stable BP. George Steidle PA-C

## 2012-10-21 NOTE — CV Procedure (Signed)
Sheath Pull 2925. Site was oozing from suture. Sheath pulled from right femoral artery. Manual pressure held for 20 mins. DP present throughout sheath pull, present post. Vitals: HR 120-160, BP 110's/60's. o2 98%. Post instructions given. No complications, patient tolerated. Nurse visualized groin. Site post, soft, no hematoma.

## 2012-10-21 NOTE — Progress Notes (Signed)
Patient ID: George Pollard, male   DOB: 10-Mar-1933, 78 y.o.   MRN: [id number]   INR is down to 1.6. The plan is to proceed with catheterization today.  Jerral Bonito, MD

## 2012-10-21 NOTE — Progress Notes (Signed)
Notified T.Arguello, PA about patients heartrate jumping to 160s; orders were to give ordered metoprolol and follow up with heart rate; asked about extra ASA dose, ordered to give 324 for pre-cath; medication given; pt and wife at bedside awaiting transport to cath lab

## 2012-10-21 NOTE — CV Procedure (Signed)
Cardiac Catheterization Operative Report  George Pollard [id number] 11/6/201312:08 PM No primary provider on file.  Procedure Performed:  1. Left Heart Catheterization 2. Selective Coronary Angiography 3. LIMA graft angiography 4. SVG graft angiography 5. Left ventricular pressures 6. PTCA/DES x 1 proximal Circumflex  Operator: Verne Carrow, MD  Indication:  78 yo WM with history of chronic atrial fibrillation on coumadin, CAD s/p CABG, DM admitted with NSTEMI. Cath delayed x 48 hours secondary to elevated INR as pt was pain free with no injury current on EKG.                         Procedure Details: The risks, benefits, complications, treatment options, and expected outcomes were discussed with the patient. The patient and/or family concurred with the proposed plan, giving informed consent. The patient was brought to the cath lab after IV hydration was begun and oral premedication was given. The patient was further sedated with Versed and Fentanyl. The right groin was prepped and draped in the usual manner. Using the modified Seldinger access technique, a 5 French sheath was placed in the right femoral artery. Standard diagnostic catheters were used to perform selective coronary angiography. The JR4 catheter was used to engage the native RCA and the vein graft to the PDA. A LCB was used to engage the vein graft to the Diagonal. An IMA catheter was used to engage the LIMA graft.  A pigtail catheter was used to measure LV pressures. The patient was found to have complex disease. His culprit vessel was felt to be the Circumflex with a 99% proximal stenosis with TIMI-2 flow into the mid Circumflex and the moderate sized second OM branch. I considered redo bypass as he has an occluded vein to the RCA but I felt that given his age, the disease would be best approached from a percutaneous approach.   The sheath was upsized to a 6 [place] system. He was given a bolus of Angiomax and a  drip was started. He was given 180 mg Brilinta po x 1. I then engaged the left main with a XB 3.0 guide. When the ACT was >200, I passed a BMW wire down the Circumflex. A 2.5 x 12 mm balloon was used to pre-dilate the stenosis. A 2.75 x 15 mm Xience Expedition DES was deployed in the proximal Circumflex. This was post-dilated with a 3.0 x 12 mm [place] balloon. There was an excellent result. The stenosis was taken from 99% down to 0%. The second OM branch was covered with the stent but had excellent flow with minimal plaque shift. The guide was removed. All catheter exchanges were over a wire. A right femoral artery angiogram was performed and showed that the arterial puncture site was high in the CFA so no closure device was used.   There were no immediate complications. The patient was taken to the recovery area in stable condition.   Hemodynamic Findings: Central aortic pressure: 96/67 Left ventricular pressure: 98/14/21  Angiographic Findings:  Left main: 10% stenosis.   Left Anterior Descending Artery: 100% ostial occlusion. The mid and distal vessel fills from the patent LIMA graft.   Circumflex Artery: Large caliber vessel with 99% stenosis in the proximal vessel just beyond the early first OM branch. The second OM branch arises just beyond the severe stenosis. The remainder of this vessel is disease free.   Right Coronary Artery:  Large caliber, dominant vessel with 30% proximal stenosis, diffuse 80-90% stenosis  mid vessel with discreet 99% stenosis in the mid vessel. The distal vessel has mild plaque. The touchdown site of the occluded vein graft is seen in the distal vessel just before the bifurcation. Both the PDA and PLA are moderate caliber vessels with mild plaque disease.   Graft Anatomy:  LIMA to LAD is patent SVG to Diagonal is patent. The mid body of the vein graft has a prominent valve followed by a 30%, hazy stenosis just beyond the valve.  SVG to PDA is occluded  Left  Ventricular Angiogram: Deferred.    Impression: 1. Triple vessel CAD s/p 3V CABG with 2/3 patent bypass grafts.  2. Severe stenosis proximal Circumflex, culprit vessel. Now s/p successful PTCA/DES x 1 proximal Circumflex.  3. Stenosis in mid body of vein graft to Diagonal 4. Severe stenosis mid RCA, occluded vein graft to the distal RCA 5. Ischemic cardiomyopathy based on reduced LVEF on echo.   Recommendations: I carefully reviewed the anatomy before the PCI. I considered redo CABG but the patient did not wish to consider CABG. I felt that each lesion could be approached percutaneously. Since he will need a long segment of stenting in the RCA, a drug eluting platform was chosen for the Circumflex. There was an excellent result today in his culprit vessel, the Circumflex. Will continue ASA and Brilinta for now. Will review films with colleagues and plan PCI of the RCA before discharge (this would be best if this could be done before his coumadin is resumed). May also consider PCI of the vein graft to the Diagonal. I would plan on stopping his ASA in one month since he will need to be on Brilinta and coumadin.        Complications:  None. The patient tolerated the procedure well.

## 2012-10-21 NOTE — Progress Notes (Signed)
RN was called to patient's room by a family member.  Patient states that he was sneezing and when he checked his groin it was hard.  RN noted a hematoma. Pressure applied for 15 minutes. No further bleeding noted. Dorsalis pedis pulse is 2+.  Patient states that he held his groin when he sneezed.

## 2012-10-22 ENCOUNTER — Inpatient Hospital Stay (HOSPITAL_COMMUNITY): Payer: Medicare Other

## 2012-10-22 LAB — BASIC METABOLIC PANEL
BUN: 24 mg/dL — ABNORMAL HIGH (ref 6–23)
Calcium: 9.4 mg/dL (ref 8.4–10.5)
Chloride: 98 mEq/L (ref 96–112)
Chloride: 95 mEq/L — ABNORMAL LOW (ref 96–112)
Creatinine, Ser: 1.17 mg/dL (ref 0.50–1.35)
GFR calc Af Amer: 89 mL/min — ABNORMAL LOW (ref >90)
GFR calc Af Amer: 68 mL/min — ABNORMAL LOW (ref >90)
GFR calc non Af Amer: 58 mL/min — ABNORMAL LOW (ref >90)
Potassium: 3.3 mEq/L — ABNORMAL LOW (ref 3.5–5.1)

## 2012-10-22 LAB — PROTIME-INR
INR: 1.22 (ref 0.00–1.49)
Prothrombin Time: 15.2 seconds (ref 11.6–15.2)

## 2012-10-22 LAB — CBC
HCT: 46.6 % (ref 39.0–52.0)
RDW: 14.2 % (ref 11.5–15.5)
WBC: 9.8 10*3/uL (ref 4.0–10.5)

## 2012-10-22 LAB — GLUCOSE, CAPILLARY
Glucose-Capillary: 153 mg/dL — ABNORMAL HIGH (ref 70–99)
Glucose-Capillary: 151 mg/dL — ABNORMAL HIGH (ref 70–99)

## 2012-10-22 MED ORDER — DILTIAZEM LOAD VIA INFUSION
10.0000 mg | Freq: Once | INTRAVENOUS | Status: AC
  Administered 2012-10-22: 10 mg via INTRAVENOUS
  Filled 2012-10-22: qty 10

## 2012-10-22 MED ORDER — DEXTROSE 5 % IV SOLN
5.0000 mg/h | INTRAVENOUS | Status: DC
  Administered 2012-10-22: 5 mg/h via INTRAVENOUS
  Administered 2012-10-23 – 2012-10-25 (×5): 10 mg/h via INTRAVENOUS
  Filled 2012-10-22: qty 100

## 2012-10-22 MED ORDER — TICAGRELOR 90 MG PO TABS
90.0000 mg | ORAL_TABLET | Freq: Two times a day (BID) | ORAL | Status: DC
  Administered 2012-10-22 – 2012-10-29 (×14): 90 mg via ORAL
  Filled 2012-10-22 (×15): qty 1

## 2012-10-22 MED ORDER — HEPARIN (PORCINE) IN NACL 100-0.45 UNIT/ML-% IJ SOLN
1800.0000 [IU]/h | INTRAMUSCULAR | Status: DC
  Administered 2012-10-22 – 2012-10-23 (×3): 1800 [IU]/h via INTRAVENOUS
  Filled 2012-10-22 (×5): qty 250

## 2012-10-22 MED ORDER — METOPROLOL TARTRATE 50 MG PO TABS
50.0000 mg | ORAL_TABLET | Freq: Two times a day (BID) | ORAL | Status: DC
  Administered 2012-10-22 – 2012-10-23 (×3): 50 mg via ORAL
  Filled 2012-10-22 (×4): qty 1

## 2012-10-22 NOTE — Progress Notes (Signed)
ANTICOAGULATION CONSULT NOTE - Follow Up Consult  Pharmacy Consult for Heparin Indication: CAD  No Known Allergies  Patient Measurements: Height: 5\' 10"  (177.8 cm) Weight: 235 lb 3.7 oz (106.7 kg) IBW/kg (Calculated) : 73  Heparin Dosing Weight: 96 kg  Vital Signs: Temp: 98.2 F (36.8 C) (11/07 1150) Temp src: Oral (11/07 1150) BP: 115/95 mmHg (11/07 1150) Pulse Rate: 119  (11/07 1150)  Labs:  Basename 10/22/12 0945 10/22/12 0545 10/21/12 0458 10/20/12 1329 10/20/12 1050 10/20/12 0515 10/20/12 0034 10/20/12 0001  HGB -- 16.1 14.8 -- -- -- -- --  HCT -- 46.6 43.5 -- -- 43.6 -- --  PLT -- 177 168 -- -- 169 -- --  APTT -- -- -- -- -- -- -- --  LABPROT 15.2 -- 19.2* 20.6* -- -- -- --  INR 1.22 -- 1.68* 1.84* -- -- -- --  HEPARINUNFRC -- -- 0.26* 0.42 -- -- 0.19* --  CREATININE -- 0.99 0.95 -- -- 0.86 -- --  CKTOTAL -- -- -- -- 854* -- -- --  CKMB -- -- -- -- 75.0* -- -- --  TROPONINI -- -- -- -- >20.00* >20.00* -- >20.00*    Estimated Creatinine Clearance: 76.5 ml/min (by C-G formula based on Cr of 0.99).   Assessment: 78 yo male with h/o atrial fibrillation transferred from [facility] with NSTEMI per [facility] cardiology consult. Patient was on coumadin pta and INR at [facility] was 2. Patient received phytonadione 2.5mg  orally, coumadin was placed on hold. S/p cath with plan for PCI on Monday. Heparin has been reordered with a low goal.   Goal of Therapy:  Heparin level 0.3-0.4 units/ml Monitor platelets by anticoagulation protocol: Yes   Plan:  Heparin level at 1800 units/hr F/u with 6 hr level

## 2012-10-22 NOTE — Care Management Note (Signed)
    Page 1 of 1   10/22/2012     12:05:57 PM   CARE MANAGEMENT NOTE 10/22/2012  Patient:  LYNTON, CRESCENZO   Account Number:  [id number]  Date Initiated:  10/20/2012  Documentation initiated by:  Alvira Philips Assessment:   78 yr-old male adm with dx of NSTEMI; lives with spouse     Action/Plan:   Anticipated DC Date:     Anticipated DC Plan:        DC Planning Services  CM consult  Medication Assistance      Choice offered to / List presented to:             Status of service:   Medicare Important Message given?   (If response is "NO", the following Medicare IM given date fields will be blank) Date Medicare IM given:   Date Additional Medicare IM given:    Discharge Disposition:    Per UR Regulation:  Reviewed for med. necessity/level of care/duration of stay  If discussed at Long Length of Stay Meetings, dates discussed:    Comments:  PCP:  [facility] 11/7 12n debbie Gagan Dillion rn,bsn will give pt brilinta 30day card and copay assist card.

## 2012-10-22 NOTE — Progress Notes (Signed)
Notified provider on call of increase in heart rate to 170's. New orders entered will continue to monitor patient.

## 2012-10-22 NOTE — Progress Notes (Signed)
Patient Name: George Pollard Date of Encounter: 10/22/2012     Active Problems:  NSTEMI (non-ST elevated myocardial infarction)  Atrial fibrillation  Cardiomyopathy, ischemic  CAD (coronary artery disease) of artery bypass graft    SUBJECTIVE  Doing well today.  Feels ok.  Much better overall.  Results of cath reviewed and discussed with CMAC. Also echo results reviewed.    CURRENT MEDS    . [EXPIRED] aspirin      . [COMPLETED] aspirin  162 mg Oral Once  . aspirin EC  81 mg Oral Daily  . atorvastatin  80 mg Oral q1800  . [COMPLETED] bivalirudin      . [COMPLETED] fentaNYL      . furosemide  80 mg Intravenous BID  . [COMPLETED] heparin      . [COMPLETED] lidocaine      . metoprolol tartrate  25 mg Oral TID  . [COMPLETED] midazolam      . [COMPLETED] nitroGLYCERIN      . terazosin  1 mg Oral QHS  . [COMPLETED] Ticagrelor      . Ticagrelor  90 mg Oral BID  . [DISCONTINUED] insulin aspart  0-9 Units Subcutaneous Q4H  . [DISCONTINUED] metoprolol tartrate  25 mg Oral BID  . [DISCONTINUED] metoprolol tartrate  25 mg Oral TID  . [DISCONTINUED] nitroGLYCERIN  1 inch Topical Q6H  . [DISCONTINUED] sodium chloride  3 mL Intravenous Q12H    OBJECTIVE  Filed Vitals:   10/22/12 0400 10/22/12 0500 10/22/12 0600 10/22/12 0700  BP: 115/90 112/72 119/34 113/62  Pulse: 123 50 93 78  Temp: 98 F (36.7 C)     TempSrc: Oral     Resp: 22 20 20 26   Height:      Weight:      SpO2: 95% 97% 97% 96%    Intake/Output Summary (Last 24 hours) at 10/22/12 0824 Last data filed at 10/22/12 0400  Gross per 24 hour  Intake    795 ml  Output   3570 ml  Net  -2775 ml   Filed Weights   10/19/12 2130  Weight: 235 lb 3.7 oz (106.7 kg)    PHYSICAL EXAM  General: Pleasant, NAD. Neuro: Alert and oriented X 3. Moves all extremities spontaneously. Psych: Normal affect. HEENT:  Normal  Neck: Supple without bruits or JVD. Lungs:  Resp regular and unlabored, CTA. Heart: irregularly  irregular with moderate VR.   Abdomen: Soft, non-tender, non-distended, BS + x 4.  Extremities: No clubbing, cyanosis or edema. DP/PT/Radials 2+ and equal bilaterally.  Femoral site with mild bruising.    Accessory Clinical Findings  CBC  Basename 10/22/12 0545 10/21/12 0458  WBC 9.8 9.0  NEUTROABS -- --  HGB 16.1 14.8  HCT 46.6 43.5  MCV 88.3 89.3  PLT 177 168   Basic Metabolic Panel  Basename 10/22/12 0545 10/21/12 0458  NA 137 136  K 3.3* 3.8  CL 98 100  CO2 27 26  GLUCOSE 141* 151*  BUN 24* 19  CREATININE 0.99 0.95  CALCIUM 8.9 8.6  MG -- --  PHOS -- --   Liver Function Tests No results found for this basename: AST:2,ALT:2,ALKPHOS:2,BILITOT:2,PROT:2,ALBUMIN:2 in the last 72 hours No results found for this basename: LIPASE:2,AMYLASE:2 in the last 72 hours Cardiac Enzymes  Basename 10/20/12 1050 10/20/12 0515 10/20/12 0001  CKTOTAL 854* -- --  CKMB 75.0* -- --  CKMBINDEX -- -- --  TROPONINI >20.00* >20.00* >20.00*   BNP No components found with this basename: POCBNP:3  D-Dimer No results found for this basename: DDIMER:2 in the last 72 hours Hemoglobin A1C  Basename 10/20/12 0001  HGBA1C 6.9*   Fasting Lipid Panel  Basename 10/20/12 0515  CHOL 155  HDL 36*  LDLCALC 95  TRIG 098  CHOLHDL 4.3  LDLDIRECT --   Thyroid Function Tests No results found for this basename: TSH,T4TOTAL,FREET3,T3FREE,THYROIDAB in the last 72 hours  TELE  Moderately fast VR  ECG  AF.  Inferior MI, moderate VR.  Nonspecific ST and T changes.    Radiology/Studies  Dg Chest Port 1 View  10/20/2012  *RADIOLOGY REPORT*  Clinical Data: Pulmonary edema  PORTABLE CHEST - 1 VIEW  Comparison: 10/19/2012  Findings: Cardiomegaly with suspected mild interstitial edema. Bilateral lower lobe opacities, likely atelectasis.  No pleural effusion or pneumothorax.  Postsurgical changes related to prior CABG.  IMPRESSION: Cardiomegaly with suspected mild interstitial edema.   Original Report  Authenticated By: Charline Bills, M.D.     ASSESSMENT AND PLAN   1.  Acute Non STEMI 2.  Prior CABG 3.  Atrial fib   Plan  1.  Check P2Y12 2.  Check INR and repeat enzymes 3.  Recheck BMET 4.  Films reviewed with multiple people--favor PCI of RCA, prob with DES with WOEST approach to anticoagulation.    Will make final plans after labs regarding timing.    Signed, Shawnie Pons MD, [facility], FSCAI

## 2012-10-22 NOTE — Progress Notes (Signed)
Reviewed chart and rhythm. Will hold ambulation today. Began ed. Pt voiced understanding. Will f/u. [id number] Ethelda Chick CES, ACSM

## 2012-10-22 NOTE — Progress Notes (Signed)
HR still over 100.  EF 25%.  May opt to defer PCI until Monday to optimize clinical status.   Reviewed with family.

## 2012-10-23 ENCOUNTER — Inpatient Hospital Stay (HOSPITAL_COMMUNITY): Payer: Medicare Other

## 2012-10-23 LAB — CBC
HCT: 45 % (ref 39.0–52.0)
Hemoglobin: 15.7 g/dL (ref 13.0–17.0)
MCHC: 34.9 g/dL (ref 30.0–36.0)
MCV: 87.7 fL (ref 78.0–100.0)

## 2012-10-23 LAB — BASIC METABOLIC PANEL
BUN: 30 mg/dL — ABNORMAL HIGH (ref 6–23)
Chloride: 99 mEq/L (ref 96–112)
GFR calc Af Amer: 78 mL/min — ABNORMAL LOW (ref >90)
GFR calc non Af Amer: 67 mL/min — ABNORMAL LOW (ref >90)
Potassium: 3.4 mEq/L — ABNORMAL LOW (ref 3.5–5.1)
Sodium: 137 mEq/L (ref 135–145)

## 2012-10-23 LAB — TROPONIN I: Troponin I: 6.32 ng/mL (ref <0.30)

## 2012-10-23 LAB — PRO B NATRIURETIC PEPTIDE: Pro B Natriuretic peptide (BNP): 3162 pg/mL — ABNORMAL HIGH (ref 0–450)

## 2012-10-23 LAB — GLUCOSE, CAPILLARY
Glucose-Capillary: 154 mg/dL — ABNORMAL HIGH (ref 70–99)
Glucose-Capillary: 148 mg/dL — ABNORMAL HIGH (ref 70–99)

## 2012-10-23 LAB — HEPARIN LEVEL (UNFRACTIONATED): Heparin Unfractionated: 0.29 IU/mL — ABNORMAL LOW (ref 0.30–0.70)

## 2012-10-23 MED ORDER — METOPROLOL TARTRATE 50 MG PO TABS
50.0000 mg | ORAL_TABLET | Freq: Three times a day (TID) | ORAL | Status: DC
  Administered 2012-10-23 – 2012-10-26 (×11): 50 mg via ORAL
  Filled 2012-10-23 (×14): qty 1

## 2012-10-23 MED ORDER — INSULIN ASPART 100 UNIT/ML ~~LOC~~ SOLN
0.0000 [IU] | Freq: Three times a day (TID) | SUBCUTANEOUS | Status: DC
  Administered 2012-10-23: 1 [IU] via SUBCUTANEOUS
  Administered 2012-10-23: 2 [IU] via SUBCUTANEOUS
  Administered 2012-10-24: 1 [IU] via SUBCUTANEOUS
  Administered 2012-10-24 – 2012-10-25 (×4): 2 [IU] via SUBCUTANEOUS
  Administered 2012-10-25 – 2012-10-26 (×3): 1 [IU] via SUBCUTANEOUS
  Administered 2012-10-27: 2 [IU] via SUBCUTANEOUS
  Administered 2012-10-27 – 2012-10-29 (×4): 1 [IU] via SUBCUTANEOUS

## 2012-10-23 MED ORDER — POTASSIUM CHLORIDE CRYS ER 20 MEQ PO TBCR
40.0000 meq | EXTENDED_RELEASE_TABLET | Freq: Once | ORAL | Status: AC
  Administered 2012-10-23: 40 meq via ORAL
  Filled 2012-10-23 (×2): qty 1

## 2012-10-23 MED ORDER — FUROSEMIDE 40 MG PO TABS
40.0000 mg | ORAL_TABLET | Freq: Two times a day (BID) | ORAL | Status: DC
  Administered 2012-10-23 – 2012-10-29 (×12): 40 mg via ORAL
  Filled 2012-10-23 (×16): qty 1

## 2012-10-23 MED ORDER — POTASSIUM CHLORIDE CRYS ER 20 MEQ PO TBCR
20.0000 meq | EXTENDED_RELEASE_TABLET | Freq: Every day | ORAL | Status: DC
  Administered 2012-10-24 – 2012-10-29 (×6): 20 meq via ORAL
  Filled 2012-10-23 (×6): qty 1

## 2012-10-23 MED ORDER — INSULIN ASPART 100 UNIT/ML ~~LOC~~ SOLN: 0.0000 [IU] | Freq: Three times a day (TID) | SUBCUTANEOUS | Status: DC

## 2012-10-23 NOTE — Progress Notes (Signed)
Please specify ambulation is desired before PCI. Thx. Will f/u. Ethelda Chick CES, ACSM

## 2012-10-23 NOTE — Progress Notes (Signed)
ANTICOAGULATION CONSULT NOTE - Follow Up Consult  Pharmacy Consult for heparin Indication: CAD awaiting PCI  Labs:  Basename 10/22/12 2353 10/22/12 1615 10/22/12 0945 10/22/12 0545 10/21/12 0458 10/20/12 1329 10/20/12 1050 10/20/12 0515  HGB -- -- -- 16.1 14.8 -- -- --  HCT -- -- -- 46.6 43.5 -- -- 43.6  PLT -- -- -- 177 168 -- -- 169  APTT -- -- -- -- -- -- -- --  LABPROT -- -- 15.2 -- 19.2* 20.6* -- --  INR -- -- 1.22 -- 1.68* 1.84* -- --  HEPARINUNFRC 0.29* -- -- -- 0.26* 0.42 -- --  CREATININE -- 1.17 -- 0.99 0.95 -- -- --  CKTOTAL -- -- -- -- -- -- 854* --  CKMB -- -- -- -- -- -- 75.0* --  TROPONINI -- -- -- -- -- -- >20.00* >20.00*    Assessment/Plan:  78yo male slightly subtherapeutic on heparin with initial dosing awaiting PCI for NSTEMI; given low tight goal (0.3-0.4) will continue gtt at current rate and confirm with am labs.  Colleen Can PharmD BCPS 10/23/2012,12:47 AM

## 2012-10-23 NOTE — Progress Notes (Signed)
Pt HR at rest 108-120s. Per MD order goal HR on Cardizem is 65-105. Cardizem gtt increased from 5mg /hr to 10mg /hr. Will monitor closely. Present BP 136/80. Pt resting comfortably and quietly. Family at bedside.

## 2012-10-23 NOTE — Progress Notes (Signed)
ANTICOAGULATION CONSULT NOTE - Follow Up Consult  Pharmacy Consult for Heparin Indication: CAD  No Known Allergies  Patient Measurements: Height: 5\' 10"  (177.8 cm) Weight: 235 lb 3.7 oz (106.7 kg) IBW/kg (Calculated) : 73  Heparin Dosing Weight: 96 kg  Vital Signs: Temp: 97.9 F (36.6 C) (11/08 0810) Temp src: Oral (11/08 0810) BP: 116/67 mmHg (11/08 1000) Pulse Rate: 77  (11/08 1000)  Labs:  Basename 10/23/12 0725 10/23/12 0445 10/22/12 2353 10/22/12 1615 10/22/12 0945 10/22/12 0545 10/21/12 0458 10/20/12 1329 10/20/12 1050  HGB -- 15.7 -- -- -- 16.1 -- -- --  HCT -- 45.0 -- -- -- 46.6 43.5 -- --  PLT -- 192 -- -- -- 177 168 -- --  APTT -- -- -- -- -- -- -- -- --  LABPROT -- -- -- -- 15.2 -- 19.2* 20.6* --  INR -- -- -- -- 1.22 -- 1.68* 1.84* --  HEPARINUNFRC 0.36 -- 0.29* -- -- -- 0.26* -- --  CREATININE -- 1.04 -- 1.17 -- 0.99 -- -- --  CKTOTAL -- -- -- -- -- -- -- -- 854*  CKMB -- -- -- -- -- -- -- -- 75.0*  TROPONINI -- 6.32* -- -- -- -- -- -- >20.00*    Estimated Creatinine Clearance: 72.8 ml/min (by C-G formula based on Cr of 1.04).   Assessment: 78 yo male with h/o atrial fibrillation transferred from [facility] with NSTEMI per [facility] cardiology consult. Patient was on coumadin pta and INR at [facility] was 2. Patient received phytonadione 2.5mg  orally, coumadin was placed on hold. S/p cath with plan for PCI on Monday. Heparin has been reordered with a low goal.  This morning's heparin level is 0.36, at goal on 1800 units/hr.  Goal of Therapy:  Heparin level 0.3-0.4 units/ml Monitor platelets by anticoagulation protocol: Yes   Plan:  Heparin level at 1800 units/hr F/u daily heparin level and CBC.  Reece Leader, Pharm D 10/23/2012 10:22 AM

## 2012-10-23 NOTE — Progress Notes (Signed)
Patient Name: George Pollard Date of Encounter: 10/23/2012     Active Problems:  NSTEMI (non-ST elevated myocardial infarction)  Atrial fibrillation  Cardiomyopathy, ischemic  CAD (coronary artery disease) of artery bypass graft    SUBJECTIVE  Doing pretty well.  Dr. Clifton James and I are in agreement that PCI should be deferred until Monday to allow a little better stabilization, given EF of 25%, poorly controlled rate of AF, and large RCA territory.  The patient feels ok.  No current chest pain. Troponin is down.  Now back on IV UFH.    CURRENT MEDS    . aspirin EC  81 mg Oral Daily  . atorvastatin  80 mg Oral q1800  . insulin aspart  0-9 Units Subcutaneous TID WC  . metoprolol tartrate  50 mg Oral BID  . [COMPLETED] potassium chloride  40 mEq Oral Once  . terazosin  1 mg Oral QHS  . Ticagrelor  90 mg Oral BID  . [DISCONTINUED] furosemide  80 mg Intravenous BID  . [DISCONTINUED] insulin aspart  0-9 Units Subcutaneous TID WC  . [DISCONTINUED] Ticagrelor  90 mg Oral BID    OBJECTIVE  Filed Vitals:   10/23/12 1200 10/23/12 1224 10/23/12 1300 10/23/12 1400  BP: 114/63  108/69 112/72  Pulse: 87  84 92  Temp:  97.8 F (36.6 C)    TempSrc:  Oral    Resp: 20  19 19   Height:      Weight:      SpO2: 94%  95% 94%    Intake/Output Summary (Last 24 hours) at 10/23/12 1404 Last data filed at 10/23/12 1300  Gross per 24 hour  Intake  804.2 ml  Output   1425 ml  Net -620.8 ml   Filed Weights   10/19/12 2130  Weight: 235 lb 3.7 oz (106.7 kg)    PHYSICAL EXAM  General: Pleasant, NAD. Neuro: Alert and oriented X 3. Moves all extremities spontaneously. Psych: Normal affect. HEENT:  Normal  Neck: Supple without bruits or JVD. Lungs:  Resp regular and unlabored, CTA. Heart: irregularly, irregular.  Abdomen: Soft, non-tender, non-distended, BS + x 4.  Extremities: Mild echymossis at groin site, but stable overall.    Accessory Clinical Findings  CBC  Basename  10/23/12 0445 10/22/12 0545  WBC 10.5 9.8  NEUTROABS -- --  HGB 15.7 16.1  HCT 45.0 46.6  MCV 87.7 88.3  PLT 192 177   Basic Metabolic Panel  Basename 10/23/12 0445 10/22/12 1615  NA 137 138  K 3.4* 4.0  CL 99 95*  CO2 27 33*  GLUCOSE 163* 137*  BUN 30* 29*  CREATININE 1.04 1.17  CALCIUM 9.0 9.4  MG -- --  PHOS -- --   Liver Function Tests No results found for this basename: AST:2,ALT:2,ALKPHOS:2,BILITOT:2,PROT:2,ALBUMIN:2 in the last 72 hours No results found for this basename: LIPASE:2,AMYLASE:2 in the last 72 hours Cardiac Enzymes  Basename 10/23/12 0445  CKTOTAL --  CKMB --  CKMBINDEX --  TROPONINI 6.32*   BNP No components found with this basename: POCBNP:3 D-Dimer No results found for this basename: DDIMER:2 in the last 72 hours Hemoglobin A1C No results found for this basename: HGBA1C in the last 72 hours Fasting Lipid Panel No results found for this basename: CHOL,HDL,LDLCALC,TRIG,CHOLHDL,LDLDIRECT in the last 72 hours Thyroid Function Tests No results found for this basename: TSH,T4TOTAL,FREET3,T3FREE,THYROIDAB in the last 72 hours  TELE  Rate remains just above 100.      Radiology/Studies  Dg Chest [facility]  1 View  10/23/2012  *RADIOLOGY REPORT*  Clinical Data: Cough, CHF  PORTABLE CHEST - 1 VIEW  Comparison: 10/22/2012  Findings: Cardiomegaly with central vascular congestion.  Mild interstitial prominence and peribronchial thickening.  Linear opacities at the right greater than left lung bases.  Status post median sternotomy and CABG.  Aortic arch atherosclerosis.  No pneumothorax or pleural effusion.  No acute osseous finding.  IMPRESSION: Mild interstitial edema pattern is similar.  Mild bibasilar linear opacities, favor atelectasis.   Original Report Authenticated By: Jearld Lesch, M.D.    Dg Chest Port 1 View  10/20/2012  *RADIOLOGY REPORT*  Clinical Data: Pulmonary edema  PORTABLE CHEST - 1 VIEW  Comparison: 10/19/2012  Findings: Cardiomegaly  with suspected mild interstitial edema. Bilateral lower lobe opacities, likely atelectasis.  No pleural effusion or pneumothorax.  Postsurgical changes related to prior CABG.  IMPRESSION: Cardiomegaly with suspected mild interstitial edema.   Original Report Authenticated By: Charline Bills, M.D.    Dg Chest Port 1v Same Day  10/22/2012  *RADIOLOGY REPORT*  Clinical Data: Congestive failure  PORTABLE CHEST - 1 VIEW SAME DAY  Comparison: 10/20/2012  Findings: Mild cardiomegaly is again noted.  Postsurgical changes are again seen.  There are changes of vascular congestion with very mild interstitial edema.  No focal confluent infiltrate is seen. No sizable effusion is noted.  IMPRESSION: Stable congestive failure with mild interstitial edema.   Original Report Authenticated By: Alcide Clever, M.D.     ASSESSMENT AND PLAN  1.  MI likely due to CFX vessel sp PCI of CFX.  2.  Permanent atrial fib on chronic warfarin anticoagulation. 3.  Ischemic cardiomyopathy.    Rec:  Continue to move toward better rate control  ? Raise metoprolol dose or add dig Continue gentle diuresis   -- Xray still a little wet.  Continue IV heparin with goal of level 0.3 to 0.5 Plan for PCI probably on Monday or when best stabilized.    Signed, Shawnie Pons MD, [facility], FSCAI

## 2012-10-23 NOTE — Research (Signed)
New orders for KCL daily noted. Pt already received at 1300. Dr Riley Kill paged and aware. States to hold the dose today and begin in AM. Aware pt is on Cardizem gtt at 10mg /hr. Lopressor PO increased to TID and will attempt to wean Cardizem down.

## 2012-10-23 NOTE — Progress Notes (Signed)
Pt's blood sugar is being checked AC/HS with no coverage ordered.  Blood glucose is ranging 140s-180s. Ronie Spies, [place] PA, aware and will address.  Also aware of pt's potassium of 3.4.

## 2012-10-23 NOTE — Progress Notes (Signed)
Received call earlier from nursing regarding need for SSI. A1C 6.9 this admission and CBGs 120-160s. Do not see that patient has hx of diabetes mellitus. This will need to be discussed with patient formally at next round. Sensitive SSI ordered and CBG Ac/qhs ordered. Dayna Dunn PA-C

## 2012-10-24 LAB — CBC
MCH: 30.7 pg (ref 26.0–34.0)
MCHC: 35.1 g/dL (ref 30.0–36.0)
MCV: 87.4 fL (ref 78.0–100.0)
Platelets: 196 10*3/uL (ref 150–400)
RBC: 4.92 MIL/uL (ref 4.22–5.81)
RDW: 14.1 % (ref 11.5–15.5)

## 2012-10-24 LAB — LIPID PANEL
HDL: 40 mg/dL (ref >39)
LDL Cholesterol: 63 mg/dL (ref 0–99)
Total CHOL/HDL Ratio: 3 RATIO

## 2012-10-24 LAB — GLUCOSE, CAPILLARY: Glucose-Capillary: 145 mg/dL — ABNORMAL HIGH (ref 70–99)

## 2012-10-24 LAB — BASIC METABOLIC PANEL
CO2: 29 mEq/L (ref 19–32)
Calcium: 9 mg/dL (ref 8.4–10.5)
Creatinine, Ser: 1.02 mg/dL (ref 0.50–1.35)

## 2012-10-24 LAB — HEPARIN LEVEL (UNFRACTIONATED): Heparin Unfractionated: 0.45 IU/mL (ref 0.30–0.70)

## 2012-10-24 MED ORDER — HEPARIN (PORCINE) IN NACL 100-0.45 UNIT/ML-% IJ SOLN
1650.0000 [IU]/h | INTRAMUSCULAR | Status: DC
  Administered 2012-10-25 (×2): 1650 [IU]/h via INTRAVENOUS
  Filled 2012-10-24 (×7): qty 250

## 2012-10-24 MED ORDER — SODIUM CHLORIDE 0.9 % IV SOLN: INTRAVENOUS | Status: DC

## 2012-10-24 NOTE — Progress Notes (Signed)
Patient ID: VENSON FERENCZ, male   DOB: 10/03/1933, 78 y.o.   MRN: [id number]   Patient Name: George Pollard Date of Encounter: 10/24/2012     Active Problems:  NSTEMI (non-ST elevated myocardial infarction)  Atrial fibrillation  Cardiomyopathy, ischemic  CAD (coronary artery disease) of artery bypass graft    SUBJECTIVE  No pain dyspnea or palpitations  CURRENT MEDS    . aspirin EC  81 mg Oral Daily  . atorvastatin  80 mg Oral q1800  . furosemide  40 mg Oral BID  . insulin aspart  0-9 Units Subcutaneous TID WC  . metoprolol tartrate  50 mg Oral TID  . potassium chloride  20 mEq Oral Daily  . [COMPLETED] potassium chloride  40 mEq Oral Once  . terazosin  1 mg Oral QHS  . Ticagrelor  90 mg Oral BID  . [DISCONTINUED] insulin aspart  0-9 Units Subcutaneous TID WC  . [DISCONTINUED] metoprolol tartrate  50 mg Oral BID    OBJECTIVE  Filed Vitals:   10/24/12 0000 10/24/12 0300 10/24/12 0400 10/24/12 0742  BP: 102/62  126/76   Pulse: 79  70   Temp:  97.8 F (36.6 C)  97.8 F (36.6 C)  TempSrc:  Oral  Oral  Resp: 22  20 20   Height:      Weight:      SpO2: 93%  94% 94%    Intake/Output Summary (Last 24 hours) at 10/24/12 1140 Last data filed at 10/24/12 0744  Gross per 24 hour  Intake 1065.67 ml  Output   2025 ml  Net -959.33 ml   Filed Weights   10/19/12 2130  Weight: 235 lb 3.7 oz (106.7 kg)    PHYSICAL EXAM  General: Pleasant, NAD. Neuro: Alert and oriented X 3. Moves all extremities spontaneously. Psych: Normal affect. HEENT:  Normal  Neck: Supple without bruits or JVD. Lungs:  Resp regular and unlabored, CTA. Heart: irregularly, irregular.  Abdomen: Soft, non-tender, non-distended, BS + x 4.  Extremities: Mild echymossis at groin site, but stable overall.  Soft with stable echymosis  Accessory Clinical Findings  CBC  Basename 10/24/12 0500 10/23/12 0445  WBC 7.9 10.5  NEUTROABS -- --  HGB 15.1 15.7  HCT 43.0 45.0  MCV 87.4 87.7  PLT  196 192   Basic Metabolic Panel  Basename 10/24/12 0500 10/23/12 0445  NA 137 137  K 3.8 3.4*  CL 98 99  CO2 29 27  GLUCOSE 163* 163*  BUN 30* 30*  CREATININE 1.02 1.04  CALCIUM 9.0 9.0  MG -- --  PHOS -- --   Liver Function Tests No results found for this basename: AST:2,ALT:2,ALKPHOS:2,BILITOT:2,PROT:2,ALBUMIN:2 in the last 72 hours No results found for this basename: LIPASE:2,AMYLASE:2 in the last 72 hours Cardiac Enzymes  Basename 10/23/12 0445  CKTOTAL --  CKMB --  CKMBINDEX --  TROPONINI 6.32*   Fasting Lipid Panel  Basename 10/24/12 0500  CHOL 121  HDL 40  LDLCALC 63  TRIG 88  CHOLHDL 3.0  LDLDIRECT --   Thyroid Function Tests No results found for this basename: TSH,T4TOTAL,FREET3,T3FREE,THYROIDAB in the last 72 hours  TELE  Rate remains just above 100.      Radiology/Studies  Dg Chest Port 1 View  10/23/2012  *RADIOLOGY REPORT*  Clinical Data: Cough, CHF  PORTABLE CHEST - 1 VIEW  Comparison: 10/22/2012  Findings: Cardiomegaly with central vascular congestion.  Mild interstitial prominence and peribronchial thickening.  Linear opacities at the right greater than left lung  bases.  Status post median sternotomy and CABG.  Aortic arch atherosclerosis.  No pneumothorax or pleural effusion.  No acute osseous finding.  IMPRESSION: Mild interstitial edema pattern is similar.  Mild bibasilar linear opacities, favor atelectasis.   Original Report Authenticated By: Jearld Lesch, M.D.    Dg Chest Port 1 View  10/20/2012  *RADIOLOGY REPORT*  Clinical Data: Pulmonary edema  PORTABLE CHEST - 1 VIEW  Comparison: 10/19/2012  Findings: Cardiomegaly with suspected mild interstitial edema. Bilateral lower lobe opacities, likely atelectasis.  No pleural effusion or pneumothorax.  Postsurgical changes related to prior CABG.  IMPRESSION: Cardiomegaly with suspected mild interstitial edema.   Original Report Authenticated By: Charline Bills, M.D.    Dg Chest Port 1v Same  Day  10/22/2012  *RADIOLOGY REPORT*  Clinical Data: Congestive failure  PORTABLE CHEST - 1 VIEW SAME DAY  Comparison: 10/20/2012  Findings: Mild cardiomegaly is again noted.  Postsurgical changes are again seen.  There are changes of vascular congestion with very mild interstitial edema.  No focal confluent infiltrate is seen. No sizable effusion is noted.  IMPRESSION: Stable congestive failure with mild interstitial edema.   Original Report Authenticated By: Alcide Clever, M.D.     ASSESSMENT AND PLAN  1.  MI likely due to CFX vessel sp PCI of CFX.  2.  Permanent atrial fib on chronic warfarin anticoagulation. 3.  Ischemic cardiomyopathy.   Will need eval for AICD in 3 months  Rec:  Continue to move toward better rate control  ? Raise metoprolol dose or add dig Continue gentle diuresis   -- Xray still a little wet.  Continue IV heparin with goal of level 0.3 to 0.5 Plan for PCI probably on Monday or when best stabilized.    Will be on ASA/Brillinta and coumadin on D/C with ASA stopped in a month  Signed, Charlton Haws MD, [facility], [facility]

## 2012-10-24 NOTE — Progress Notes (Addendum)
CARDIAC REHAB PHASE I   PRE:  Rate/Rhythm: Afib 96  BP:  Supine:   Sitting: 135/75  Standing:    SaO2: 93 RA  MODE:  Ambulation: 350 ft   POST:  Rate/Rhythem: 137 afib with pvc's  BP:  Supine:   Sitting: 139/75  Standing:    SaO2: 95 Ambulated with minimal assistance in hallway.  Pt denies any complaints of cp but admits to slight sob.  Pt did have some orthostatic dizziness upon rising from the bed, this resolved quickly with standing still.  Family at bedside with pt.  Will follow up with pt post PCI on Monday.  Pt to side of bed, call bell in place. [phone]  Arna Medici

## 2012-10-24 NOTE — Progress Notes (Addendum)
ANTICOAGULATION CONSULT NOTE - Follow Up Consult  Pharmacy Consult for Heparin Indication: CAD, awaiting return to cath lab 11/11  No Known Allergies  Patient Measurements: Height: 5\' 10"  (177.8 cm) Weight: 235 lb 3.7 oz (106.7 kg) IBW/kg (Calculated) : 73  Heparin Dosing Weight: 96 kg  Vital Signs: Temp: 97.8 F (36.6 C) (11/09 1208) Temp src: Oral (11/09 1208) BP: 126/76 mmHg (11/09 0400) Pulse Rate: 70  (11/09 0400)  Labs:  Basename 10/24/12 1403 10/24/12 0500 10/23/12 0725 10/23/12 0445 10/22/12 1615 10/22/12 0945 10/22/12 0545  HGB -- 15.1 -- 15.7 -- -- --  HCT -- 43.0 -- 45.0 -- -- 46.6  PLT -- 196 -- 192 -- -- 177  APTT -- -- -- -- -- -- --  LABPROT -- -- -- -- -- 15.2 --  INR -- -- -- -- -- 1.22 --  HEPARINUNFRC 0.45 0.57 0.36 -- -- -- --  CREATININE -- 1.02 -- 1.04 1.17 -- --  CKTOTAL -- -- -- -- -- -- --  CKMB -- -- -- -- -- -- --  TROPONINI -- -- -- 6.32* -- -- --    Estimated Creatinine Clearance: 74.2 ml/min (by C-G formula based on Cr of 1.02).   Assessment: 78 yo male with h/o atrial fibrillation transferred from [facility] with NSTEMI per [facility] cardiology consult. Patient was on coumadin pta and INR at [facility] was 2. Patient received phytonadione 2.5mg  orally, coumadin was placed on hold. S/p cath with plan for PCI on Monday. Heparin has been reordered with a low goal.  This morning's heparin level is slightly above goal at 0.57 on 1800 units/hr.  Rate decreased to 1650/hr, and level now at goal (0.45).  Developed mild bruising at femoral site 11/6, stable today.   Goal of Therapy:  Heparin level 0.3-0.5 units/ml Monitor platelets by anticoagulation protocol: Yes   Plan:  1. Continue IV heparin to 1650 units/hr.. 2. F/u daily heparin level and CBC.  Reece Leader, Pharm D 10/24/2012 3:44 PM

## 2012-10-24 NOTE — Progress Notes (Addendum)
ANTICOAGULATION CONSULT NOTE - Follow Up Consult  Pharmacy Consult for Heparin Indication: CAD, awaiting return to cath lab 11/11  No Known Allergies  Patient Measurements: Height: 5\' 10"  (177.8 cm) Weight: 235 lb 3.7 oz (106.7 kg) IBW/kg (Calculated) : 73  Heparin Dosing Weight: 96 kg  Vital Signs: Temp: 97.8 F (36.6 C) (11/09 0742) Temp src: Oral (11/09 0742) BP: 126/76 mmHg (11/09 0400) Pulse Rate: 70  (11/09 0400)  Labs:  Basename 10/24/12 0500 10/23/12 0725 10/23/12 0445 10/22/12 2353 10/22/12 1615 10/22/12 0945 10/22/12 0545  HGB 15.1 -- 15.7 -- -- -- --  HCT 43.0 -- 45.0 -- -- -- 46.6  PLT 196 -- 192 -- -- -- 177  APTT -- -- -- -- -- -- --  LABPROT -- -- -- -- -- 15.2 --  INR -- -- -- -- -- 1.22 --  HEPARINUNFRC 0.57 0.36 -- 0.29* -- -- --  CREATININE 1.02 -- 1.04 -- 1.17 -- --  CKTOTAL -- -- -- -- -- -- --  CKMB -- -- -- -- -- -- --  TROPONINI -- -- 6.32* -- -- -- --    Estimated Creatinine Clearance: 74.2 ml/min (by C-G formula based on Cr of 1.02).   Assessment: 78 yo male with h/o atrial fibrillation transferred from [facility] with NSTEMI per [facility] cardiology consult. Patient was on coumadin pta and INR at [facility] was 2. Patient received phytonadione 2.5mg  orally, coumadin was placed on hold. S/p cath with plan for PCI on Monday. Heparin has been reordered with a low goal.  This morning's heparin level is slightly above goal at 0.57 on 1800 units/hr.  Developed mild bruising at femoral site 11/6, stable today.   Goal of Therapy:  Heparin level 0.3-0.5 units/ml Monitor platelets by anticoagulation protocol: Yes   Plan:  1. Decrease IV heparin to 1650 units/hr. 2. Recheck heparin level in 6 hrs. 3. F/u daily heparin level and CBC.  Reece Leader, Pharm D 10/24/2012 7:44 AM

## 2012-10-25 LAB — CBC
Hemoglobin: 13.6 g/dL (ref 13.0–17.0)
MCH: 30.3 pg (ref 26.0–34.0)
MCV: 87.5 fL (ref 78.0–100.0)
RBC: 4.49 MIL/uL (ref 4.22–5.81)

## 2012-10-25 LAB — GLUCOSE, CAPILLARY
Glucose-Capillary: 154 mg/dL — ABNORMAL HIGH (ref 70–99)
Glucose-Capillary: 142 mg/dL — ABNORMAL HIGH (ref 70–99)

## 2012-10-25 MED ORDER — ASPIRIN 81 MG PO CHEW
324.0000 mg | CHEWABLE_TABLET | ORAL | Status: AC
  Administered 2012-10-26: 324 mg via ORAL
  Filled 2012-10-25: qty 4

## 2012-10-25 MED ORDER — SODIUM CHLORIDE 0.9 % IJ SOLN: 3.0000 mL | INTRAMUSCULAR | Status: DC | PRN

## 2012-10-25 MED ORDER — SODIUM CHLORIDE 0.9 % IJ SOLN
3.0000 mL | Freq: Two times a day (BID) | INTRAMUSCULAR | Status: DC
  Administered 2012-10-26: 3 mL via INTRAVENOUS

## 2012-10-25 MED ORDER — DILTIAZEM HCL 60 MG PO TABS
60.0000 mg | ORAL_TABLET | Freq: Three times a day (TID) | ORAL | Status: DC | PRN
  Administered 2012-10-25: 60 mg via ORAL
  Filled 2012-10-25 (×2): qty 1

## 2012-10-25 MED ORDER — SODIUM CHLORIDE 0.9 % IV SOLN: 250.0000 mL | INTRAVENOUS | Status: DC | PRN

## 2012-10-25 NOTE — Progress Notes (Signed)
Patient ID: MATIX SADBERRY, male   DOB: Jan 09, 1933, 79 y.o.   MRN: [id number]   Patient Name: George Pollard Date of Encounter: 10/25/2012     Active Problems:  NSTEMI (non-ST elevated myocardial infarction)  Atrial fibrillation  Cardiomyopathy, ischemic  CAD (coronary artery disease) of artery bypass graft    SUBJECTIVE  No pain dyspnea or palpitations  CURRENT MEDS    . aspirin EC  81 mg Oral Daily  . atorvastatin  80 mg Oral q1800  . furosemide  40 mg Oral BID  . insulin aspart  0-9 Units Subcutaneous TID WC  . metoprolol tartrate  50 mg Oral TID  . potassium chloride  20 mEq Oral Daily  . terazosin  1 mg Oral QHS  . Ticagrelor  90 mg Oral BID    OBJECTIVE  Filed Vitals:   10/24/12 2344 10/25/12 0300 10/25/12 0309 10/25/12 0822  BP: 107/60  110/60   Pulse:      Temp:  98.3 F (36.8 C)  97.9 F (36.6 C)  TempSrc:  Oral  Oral  Resp: 18  20 20   Height:      Weight:      SpO2: 96% 97% 95% 96%    Intake/Output Summary (Last 24 hours) at 10/25/12 0913 Last data filed at 10/25/12 0600  Gross per 24 hour  Intake    438 ml  Output    900 ml  Net   -462 ml   Filed Weights   10/19/12 2130  Weight: 235 lb 3.7 oz (106.7 kg)    PHYSICAL EXAM  General: Pleasant, NAD. Neuro: Alert and oriented X 3. Moves all extremities spontaneously. Psych: Normal affect. HEENT:  Normal  Neck: Supple without bruits or JVD. Lungs:  Resp regular and unlabored, CTA. Heart: irregularly, irregular.  Abdomen: Soft, non-tender, non-distended, BS + x 4.  Extremities: Mild echymossis at groin site, but stable overall.  Soft with stable echymosis  Accessory Clinical Findings  CBC  Basename 10/25/12 0505 10/24/12 0500  WBC 8.1 7.9  NEUTROABS -- --  HGB 13.6 15.1  HCT 39.3 43.0  MCV 87.5 87.4  PLT 211 196   Basic Metabolic Panel  Basename 10/24/12 0500 10/23/12 0445  NA 137 137  K 3.8 3.4*  CL 98 99  CO2 29 27  GLUCOSE 163* 163*  BUN 30* 30*  CREATININE 1.02  1.04  CALCIUM 9.0 9.0  MG -- --  PHOS -- --   Cardiac Enzymes  Basename 10/23/12 0445  CKTOTAL --  CKMB --  CKMBINDEX --  TROPONINI 6.32*   Fasting Lipid Panel  Basename 10/24/12 0500  CHOL 121  HDL 40  LDLCALC 63  TRIG 88  CHOLHDL 3.0  LDLDIRECT --   Thyroid Function Tests No results found for this basename: TSH,T4TOTAL,FREET3,T3FREE,THYROIDAB in the last 72 hours  TELE  Rate remains just above 100.  afib 10/25/2012     Radiology/Studies  Dg Chest Port 1 View  10/23/2012  *RADIOLOGY REPORT*  Clinical Data: Cough, CHF  PORTABLE CHEST - 1 VIEW  Comparison: 10/22/2012  Findings: Cardiomegaly with central vascular congestion.  Mild interstitial prominence and peribronchial thickening.  Linear opacities at the right greater than left lung bases.  Status post median sternotomy and CABG.  Aortic arch atherosclerosis.  No pneumothorax or pleural effusion.  No acute osseous finding.  IMPRESSION: Mild interstitial edema pattern is similar.  Mild bibasilar linear opacities, favor atelectasis.   Original Report Authenticated By: Jearld Lesch, M.D.  Dg Chest Port 1 View  10/20/2012  *RADIOLOGY REPORT*  Clinical Data: Pulmonary edema  PORTABLE CHEST - 1 VIEW  Comparison: 10/19/2012  Findings: Cardiomegaly with suspected mild interstitial edema. Bilateral lower lobe opacities, likely atelectasis.  No pleural effusion or pneumothorax.  Postsurgical changes related to prior CABG.  IMPRESSION: Cardiomegaly with suspected mild interstitial edema.   Original Report Authenticated By: Charline Bills, M.D.    Dg Chest Port 1v Same Day  10/22/2012  *RADIOLOGY REPORT*  Clinical Data: Congestive failure  PORTABLE CHEST - 1 VIEW SAME DAY  Comparison: 10/20/2012  Findings: Mild cardiomegaly is again noted.  Postsurgical changes are again seen.  There are changes of vascular congestion with very mild interstitial edema.  No focal confluent infiltrate is seen. No sizable effusion is noted.   IMPRESSION: Stable congestive failure with mild interstitial edema.   Original Report Authenticated By: Alcide Clever, M.D.     ASSESSMENT AND PLAN  1.  MI likely due to CFX vessel sp PCI of CFX.  2.  Permanent atrial fib on chronic warfarin anticoagulation. 3.  Ischemic cardiomyopathy.   Will need eval for AICD in 3 months  Rec:  PCI in am ? Cooper D/C hytrin On it for BP not prostate BP soft and need room for rate control drugs Continue lopresser 50 q8 for afib  D/C iv cardizem  Will be on ASA/Brillinta and coumadin on D/C with ASA stopped in a month  Signed, Charlton Haws MD, [facility], [facility]

## 2012-10-25 NOTE — Progress Notes (Signed)
ANTICOAGULATION CONSULT NOTE - Follow Up Consult  Pharmacy Consult for Heparin Indication: CAD, awaiting return to cath lab 11/11  No Known Allergies  Patient Measurements: Height: 5\' 10"  (177.8 cm) Weight: 235 lb 3.7 oz (106.7 kg) IBW/kg (Calculated) : 73  Heparin Dosing Weight: 96 kg  Vital Signs: Temp: 97.8 F (36.6 C) (11/10 1141) Temp src: Oral (11/10 1141) BP: 127/67 mmHg (11/10 1258)  Labs:  Basename 10/25/12 0505 10/24/12 1403 10/24/12 0500 10/23/12 0445 10/22/12 1615  HGB 13.6 -- 15.1 -- --  HCT 39.3 -- 43.0 45.0 --  PLT 211 -- 196 192 --  APTT -- -- -- -- --  LABPROT -- -- -- -- --  INR -- -- -- -- --  HEPARINUNFRC 0.47 0.45 0.57 -- --  CREATININE -- -- 1.02 1.04 1.17  CKTOTAL -- -- -- -- --  CKMB -- -- -- -- --  TROPONINI -- -- -- 6.32* --    Estimated Creatinine Clearance: 74.2 ml/min (by C-G formula based on Cr of 1.02).   Assessment: 78 yo male with h/o atrial fibrillation transferred from [facility] with NSTEMI per [facility] cardiology consult. Patient was on coumadin pta and INR at [facility] was 2. Patient received phytonadione 2.5mg  orally, coumadin was placed on hold. S/p cath with plan for PCI on Monday. Heparin has been reordered with a low goal.    This morning's heparin level is at goal at 0.47 on 1650 units/hr.  Developed mild bruising at femoral site 11/6, stable today.   Goal of Therapy:  Heparin level 0.3-0.5 units/ml Monitor platelets by anticoagulation protocol: Yes   Plan:  1. Continue IV heparin 1650 units/hr. 2. F/u daily heparin level and CBC.  Reece Leader, Pharm D 10/25/2012 1:58 PM

## 2012-10-26 ENCOUNTER — Encounter (HOSPITAL_COMMUNITY)
Admission: EM | Disposition: A | Discharge status: Home / Self Care | Payer: Self-pay | Source: Other Acute Inpatient Hospital | Attending: Cardiology

## 2012-10-26 ENCOUNTER — Encounter (HOSPITAL_COMMUNITY): Payer: Self-pay

## 2012-10-26 DIAGNOSIS — I2510 Atherosclerotic heart disease of native coronary artery without angina pectoris: Secondary | ICD-10-CM

## 2012-10-26 DIAGNOSIS — I214 Non-ST elevation (NSTEMI) myocardial infarction: Secondary | ICD-10-CM

## 2012-10-26 HISTORY — PX: PERCUTANEOUS CORONARY STENT INTERVENTION (PCI-S): SHX5485

## 2012-10-26 LAB — CBC
HCT: 41.6 % (ref 39.0–52.0)
Hemoglobin: 14.2 g/dL (ref 13.0–17.0)
RBC: 4.72 MIL/uL (ref 4.22–5.81)

## 2012-10-26 LAB — GLUCOSE, CAPILLARY
Glucose-Capillary: 137 mg/dL — ABNORMAL HIGH (ref 70–99)
Glucose-Capillary: 142 mg/dL — ABNORMAL HIGH (ref 70–99)
Glucose-Capillary: 124 mg/dL — ABNORMAL HIGH (ref 70–99)

## 2012-10-26 LAB — POCT ACTIVATED CLOTTING TIME: Activated Clotting Time: 564 seconds

## 2012-10-26 LAB — HEPARIN LEVEL (UNFRACTIONATED): Heparin Unfractionated: 0.64 IU/mL (ref 0.30–0.70)

## 2012-10-26 SURGERY — PERCUTANEOUS CORONARY STENT INTERVENTION (PCI-S)
Anesthesia: LOCAL

## 2012-10-26 MED ORDER — BIVALIRUDIN 250 MG IV SOLR
INTRAVENOUS | Status: AC
  Filled 2012-10-26: qty 250

## 2012-10-26 MED ORDER — WARFARIN - PHARMACIST DOSING INPATIENT: Freq: Every day | Status: DC

## 2012-10-26 MED ORDER — NITROGLYCERIN 0.2 MG/ML ON CALL CATH LAB
INTRAVENOUS | Status: AC
  Filled 2012-10-26: qty 1

## 2012-10-26 MED ORDER — VERAPAMIL HCL 2.5 MG/ML IV SOLN
INTRAVENOUS | Status: AC
  Filled 2012-10-26: qty 2

## 2012-10-26 MED ORDER — ONDANSETRON HCL 4 MG/2ML IJ SOLN: 4.0000 mg | Freq: Four times a day (QID) | INTRAMUSCULAR | Status: DC | PRN

## 2012-10-26 MED ORDER — MIDAZOLAM HCL 2 MG/2ML IJ SOLN
INTRAMUSCULAR | Status: AC
  Filled 2012-10-26: qty 2

## 2012-10-26 MED ORDER — HEPARIN (PORCINE) IN NACL 100-0.45 UNIT/ML-% IJ SOLN
1550.0000 [IU]/h | INTRAMUSCULAR | Status: DC
  Filled 2012-10-26: qty 250

## 2012-10-26 MED ORDER — SODIUM CHLORIDE 0.9 % IJ SOLN: 3.0000 mL | INTRAMUSCULAR | Status: DC | PRN

## 2012-10-26 MED ORDER — FENTANYL CITRATE 0.05 MG/ML IJ SOLN
INTRAMUSCULAR | Status: AC
  Filled 2012-10-26: qty 2

## 2012-10-26 MED ORDER — ACETAMINOPHEN 325 MG PO TABS: 650.0000 mg | ORAL_TABLET | ORAL | Status: DC | PRN

## 2012-10-26 MED ORDER — SODIUM CHLORIDE 0.9 % IJ SOLN
3.0000 mL | Freq: Two times a day (BID) | INTRAMUSCULAR | Status: DC
  Administered 2012-10-26 – 2012-10-29 (×6): 3 mL via INTRAVENOUS

## 2012-10-26 MED ORDER — SODIUM CHLORIDE 0.9 % IV SOLN
1.0000 mL/kg/h | INTRAVENOUS | Status: AC
  Administered 2012-10-26 (×2): 1 mL/kg/h via INTRAVENOUS

## 2012-10-26 MED ORDER — LIDOCAINE HCL (PF) 1 % IJ SOLN
INTRAMUSCULAR | Status: AC
  Filled 2012-10-26: qty 30

## 2012-10-26 MED ORDER — WARFARIN SODIUM 10 MG PO TABS
10.0000 mg | ORAL_TABLET | Freq: Once | ORAL | Status: AC
  Administered 2012-10-26: 10 mg via ORAL
  Filled 2012-10-26 (×2): qty 1

## 2012-10-26 MED ORDER — HEPARIN (PORCINE) IN NACL 2-0.9 UNIT/ML-% IJ SOLN
INTRAMUSCULAR | Status: AC
  Filled 2012-10-26: qty 1000

## 2012-10-26 MED ORDER — SODIUM CHLORIDE 0.9 % IV SOLN: 250.0000 mL | INTRAVENOUS | Status: DC

## 2012-10-26 NOTE — Interval H&P Note (Signed)
History and Physical Interval Note:  10/26/2012 10:59 AM  George Pollard  has presented today for surgery, with the diagnosis of blockages  The various methods of treatment have been discussed with the patient and family. After consideration of risks, benefits and other options for treatment, the patient has consented to  Procedure(s) (LRB) with comments: PERCUTANEOUS CORONARY STENT INTERVENTION (PCI-S) (N/A) as a surgical intervention .  The patient's history has been reviewed, patient examined, no change in status, stable for surgery.  I have reviewed the patient's chart and labs.  Questions were answered to the patient's satisfaction.     [name]

## 2012-10-26 NOTE — Progress Notes (Signed)
CARDIAC REHAB PHASE I   PRE:  Rate/Rhythm: 93afib  BP:  Supine: 120/68  Sitting:   Standing:    SaO2: 95%RA  MODE:  Ambulation: 350 ft   POST:  Rate/Rhythem: 140afib, 108 with rest  BP:  Supine:   Sitting: 137/69  Standing:    SaO2: 96%RA 0845-0859 Pt walked 350 ft with steady gait. Stopped once to rest due to SOB. Denied CP. HR up to 140 with walk but to 108 with rest. To bed after walk.  George Pollard

## 2012-10-26 NOTE — CV Procedure (Signed)
   CARDIAC CATH NOTE  Name: BETO FOUGEROUSSE MRN: [id number] DOB: Oct 18, 1933  Procedure: PTCA and stenting of the right coronary artery  Indication: Non-ST elevation MI. This is a 78 year old diabetic patient with non-ST elevation infarction this hospital admission. He also has been noted to have permanent atrial fibrillation, long-standing. He underwent PCI of a critical stenosis in the left circumflex last week. He presents today for PCI of the right coronary artery. He has extensive severe disease throughout the right coronary artery with an occluded bypass graft in this distribution. The patient's left ventricular ejection fraction is 25%. He has a strong indication for continued long-term anticoagulation in the setting of permanent atrial fibrillation and severe LV dysfunction. He has been started on brilinta. Considering the difficult balance of antithrombotic therapy, thromboembolic prevention, and bleeding risk, and we have elected to treat him with brilinta and Coumadin.  Procedural Details: The right wrist was prepped, draped, and anesthetized with 1% lidocaine. Using the modified Seldinger technique, a 6 Fr sheath was introduced into the radial artery. 3 mg verapamil was administered through the radial sheath. Weight-based bivalirudin was given for anticoagulation. Once a therapeutic ACT was achieved, a 6 [place] JR 4 guide catheter was inserted.  A pro-water coronary guidewire was used to cross the lesion with a moderate amount of difficulty. There was extensive diffuse disease throughout the entirety of the right coronary artery with the only possible treatment option to include full metal jacket stenting.  The lesion was predilated with a 2.5 x 20 mm balloon.  The distal lesion was then stented with a 3.0 x 38 mm Promus Element drug-eluting stent.  A second 3.0 x 38 mm drug-eluting stent was implanted in the mid vessel in overlapping fashion. Finally, a third 3.5 x 28 mm drug-eluting stent was  implanted in overlapping fashion into the proximal RCA to obtain complete lesion coverage. The stents were postdilated with a 3.5 x 25 mm noncompliant balloon to high pressures on multiple inflations in order to post dilate the entirety of the stented segment.  Following PCI, there was 0% residual stenosis and TIMI-3 flow. Final angiography confirmed an excellent result. The patient tolerated the procedure well. There were no immediate procedural complications. A TR band was used for radial hemostasis. The patient was transferred to the post catheterization recovery area for further monitoring.  Lesion Data: Vessel: RCA/distal Percent stenosis (pre): 90 TIMI-flow (pre):  3 Stent:  3.8 x 38 mm drug-eluting Percent stenosis (post): 0 TIMI-flow (post): 3  Lesion 2: Vessel: RCA/mid Percent stenosis (pre): 95 TIMI-flow (pre):  3 Stent:  3.0 x 38 mm and 3.5 x 28 mm drug-eluting Percent stenosis (post): 0 TIMI-flow (post): 3  Conclusions: Successful complex PCI of the mid and distal right coronary artery using overlapping drug-eluting stents  Recommendations: Will resume Coumadin tonight per pharmacy protocol. Will check PT Y. 12 test tomorrow morning and the patient will be continued on brilinta.  [name] 10/26/2012, 12:07 PM

## 2012-10-26 NOTE — Progress Notes (Signed)
ANTICOAGULATION CONSULT NOTE - Follow Up Consult  Pharmacy Consult for Heparin Indication: CAD, awaiting return to cath lab 11/11  No Known Allergies  Patient Measurements: Height: 5\' 10"  (177.8 cm) Weight: 225 lb 15.5 oz (102.5 kg) IBW/kg (Calculated) : 73  Heparin Dosing Weight: 96 kg  Vital Signs: Temp: 98.5 F (36.9 C) (11/11 0821) Temp src: Oral (11/11 0821) BP: 120/68 mmHg (11/11 0800) Pulse Rate: 74  (11/11 0800)  Labs:  Basename 10/26/12 0510 10/25/12 0505 10/24/12 1403 10/24/12 0500  HGB 14.2 13.6 -- --  HCT 41.6 39.3 -- 43.0  PLT 244 211 -- 196  APTT -- -- -- --  LABPROT -- -- -- --  INR -- -- -- --  HEPARINUNFRC 0.64 0.47 0.45 --  CREATININE -- -- -- 1.02  CKTOTAL -- -- -- --  CKMB -- -- -- --  TROPONINI -- -- -- --    Estimated Creatinine Clearance: 72.7 ml/min (by C-G formula based on Cr of 1.02).   Assessment: 78 yo male with h/o atrial fibrillation transferred from [facility] with NSTEMI per [facility] cardiology consult. Patient was on coumadin pta and INR at [facility] was 2. Patient received phytonadione 2.5mg  orally, coumadin was placed on hold. S/p cath with plan for PCI on Monday.  This morning's heparin level is at goal at 0.64 on 1650 units/hr.    Goal of Therapy:  Heparin level 0.3-0.5 units/ml Monitor platelets by anticoagulation protocol: Yes   Plan:  1. Decrease IV heparin 1550 units/hr. 2. F/u daily heparin level and CBC. 3. Follow plans post cath- noted plans for coumadin  Harland German, Pharm D 10/26/2012 9:37 AM

## 2012-10-26 NOTE — CV Procedure (Signed)
TR Band completely deflated, removed and dry dressing applied. No bleeding noted.

## 2012-10-26 NOTE — H&P (View-Only) (Signed)
    Subjective:  No chest pain or dyspnea.  Objective:  Vital Signs in the last 24 hours: Temp:  [97.8 F (36.6 C)-98.8 F (37.1 C)] 98.6 F (37 C) (11/11 0344) Pulse Rate:  [58-87] 76  (11/11 0344) Resp:  [18-20] 20  (11/11 0344) BP: (116-127)/(61-69) 121/61 mmHg (11/11 0344) SpO2:  [92 %-97 %] 93 % (11/11 0344) Weight:  [102.5 kg (225 lb 15.5 oz)] 102.5 kg (225 lb 15.5 oz) (11/11 0500)  Intake/Output from previous day: 11/10 0701 - 11/11 0700 In: 662.5 [I.V.:662.5] Out: 2150 [Urine:2150]  Physical Exam: Pt is alert and oriented, pleasant male in NAD HEENT: normal Neck: JVP - normal Lungs: CTA bilaterally CV: irregularly irregular without murmur or gallop Abd: soft, NT, Positive BS, no hepatomegaly Ext: no C/C/E, right groin hematoma noted. Skin: warm/dry no rash  Lab Results:  Basename 10/26/12 0510 10/25/12 0505  WBC 8.4 8.1  HGB 14.2 13.6  PLT 244 211    Basename 10/24/12 0500  NA 137  K 3.8  CL 98  CO2 29  GLUCOSE 163*  BUN 30*  CREATININE 1.02   No results found for this basename: TROPONINI:2,CK,MB:2 in the last 72 hours  Cardiac Studies: Study Conclusions  - Left ventricle: The cavity size was normal. There was mild concentric hypertrophy. The estimated ejection fraction was 25%. Diffuse hypokinesis. There is akinesis of the inferolateral and inferior myocardium. - Mitral valve: Mild regurgitation. - Left atrium: The atrium was moderately dilated. - Right atrium: The atrium was moderately dilated.  Tele: atrial fibrillation, heart rate 90-110 bpm, personally reviewed.  Assessment/Plan:  1. NSTEMI - s/p LCx PCI (DES), now for staged PCI of the RCA for severe diffuse disease (>90 diffuse stenosis). Reviewed risks, indication, and alternatives with patient. Plan ASA, brilinta x 30 days then stop ASA.   2. AFib - high stroke risk with DM and severe LV dysfunction. Needs to restart warfarin after procedure today.  3. Ischemic cardiomyopathy. -  LVEF less than 30%. Plan reassess LV function in 3 months. Needs ACE but BP low. Will try to start ACE after cath tomorrow.  Dispo: PCI today, further plans pending result.   Abdias Hickam, M.D. 10/26/2012, 7:56 AM     

## 2012-10-26 NOTE — Progress Notes (Addendum)
ANTICOAGULATION CONSULT NOTE - Follow Up Consult  Pharmacy Consult for Coumadin Indication: Afib No Known Allergies  Patient Measurements: Height: 5\' 10"  (177.8 cm) Weight: 225 lb 15.5 oz (102.5 kg) IBW/kg (Calculated) : 73   Vital Signs: Temp: 98.5 F (36.9 C) (11/11 0821) Temp src: Oral (11/11 0821) BP: 120/68 mmHg (11/11 0800) Pulse Rate: 86  (11/11 1044)  Labs:  Basename 10/26/12 0510 10/25/12 0505 10/24/12 1403 10/24/12 0500  HGB 14.2 13.6 -- --  HCT 41.6 39.3 -- 43.0  PLT 244 211 -- 196  APTT -- -- -- --  LABPROT -- -- -- --  INR -- -- -- --  HEPARINUNFRC 0.64 0.47 0.45 --  CREATININE -- -- -- 1.02  CKTOTAL -- -- -- --  CKMB -- -- -- --  TROPONINI -- -- -- --    Estimated Creatinine Clearance: 72.7 ml/min (by C-G formula based on Cr of 1.02).   Assessment: 78 yo Pollard with h/o atrial fibrillation transferred from [facility] with NSTEMI. Patient was on coumadin pta which has been placed on hold. S/p staged PCI this afternoon, will resume coumadin this evening, confirmed with Dr. Excell Seltzer, will d/c IV heparin. INR 1.22 on 11/7, no coumadin since admission. Hgb/plt wnl.  Coumadin PTA dose: Take 10 mg everyday except Monday and Friday take 5 mg  Goal of Therapy:  INR 2-3   Plan:  1. Coumadin 10mg  po x 1 2. F/u daily INR  Bayard Hugger, PharmD, BCPS  Clinical Pharmacist  Pager: [phone]   10/26/2012 2:31 PM

## 2012-10-26 NOTE — Progress Notes (Signed)
    Subjective:  No chest pain or dyspnea.  Objective:  Vital Signs in the last 24 hours: Temp:  [97.8 F (36.6 C)-98.8 F (37.1 C)] 98.6 F (37 C) (11/11 0344) Pulse Rate:  [58-87] 76  (11/11 0344) Resp:  [18-20] 20  (11/11 0344) BP: (116-127)/(61-69) 121/61 mmHg (11/11 0344) SpO2:  [92 %-97 %] 93 % (11/11 0344) Weight:  [102.5 kg (225 lb 15.5 oz)] 102.5 kg (225 lb 15.5 oz) (11/11 0500)  Intake/Output from previous day: 11/10 0701 - 11/11 0700 In: 662.5 [I.V.:662.5] Out: 2150 [Urine:2150]  Physical Exam: Pt is alert and oriented, pleasant male in NAD HEENT: normal Neck: JVP - normal Lungs: CTA bilaterally CV: irregularly irregular without murmur or gallop Abd: soft, NT, Positive BS, no hepatomegaly Ext: no C/C/E, right groin hematoma noted. Skin: warm/dry no rash  Lab Results:  Basename 10/26/12 0510 10/25/12 0505  WBC 8.4 8.1  HGB 14.2 13.6  PLT 244 211    Basename 10/24/12 0500  NA 137  K 3.8  CL 98  CO2 29  GLUCOSE 163*  BUN 30*  CREATININE 1.02   No results found for this basename: TROPONINI:2,CK,MB:2 in the last 72 hours  Cardiac Studies: Study Conclusions  - Left ventricle: The cavity size was normal. There was mild concentric hypertrophy. The estimated ejection fraction was 25%. Diffuse hypokinesis. There is akinesis of the inferolateral and inferior myocardium. - Mitral valve: Mild regurgitation. - Left atrium: The atrium was moderately dilated. - Right atrium: The atrium was moderately dilated.  Tele: atrial fibrillation, heart rate 90-110 bpm, personally reviewed.  Assessment/Plan:  1. NSTEMI - s/p LCx PCI (DES), now for staged PCI of the RCA for severe diffuse disease (>90 diffuse stenosis). Reviewed risks, indication, and alternatives with patient. Plan ASA, brilinta x 30 days then stop ASA.   2. AFib - high stroke risk with DM and severe LV dysfunction. Needs to restart warfarin after procedure today.  3. Ischemic cardiomyopathy. -  LVEF less than 30%. Plan reassess LV function in 3 months. Needs ACE but BP low. Will try to start ACE after cath tomorrow.  Dispo: PCI today, further plans pending result.   [name], M.D. 10/26/2012, 7:56 AM

## 2012-10-27 ENCOUNTER — Inpatient Hospital Stay (HOSPITAL_COMMUNITY): Payer: Medicare Other

## 2012-10-27 LAB — BASIC METABOLIC PANEL
BUN: 29 mg/dL — ABNORMAL HIGH (ref 6–23)
CO2: 24 mEq/L (ref 19–32)
Chloride: 103 mEq/L (ref 96–112)
Creatinine, Ser: 0.97 mg/dL (ref 0.50–1.35)
Potassium: 3.7 mEq/L (ref 3.5–5.1)

## 2012-10-27 LAB — GLUCOSE, CAPILLARY
Glucose-Capillary: 154 mg/dL — ABNORMAL HIGH (ref 70–99)
Glucose-Capillary: 173 mg/dL — ABNORMAL HIGH (ref 70–99)

## 2012-10-27 LAB — CBC
HCT: 41.7 % (ref 39.0–52.0)
Hemoglobin: 14.6 g/dL (ref 13.0–17.0)
MCV: 87.4 fL (ref 78.0–100.0)
RBC: 4.77 MIL/uL (ref 4.22–5.81)
WBC: 10.2 10*3/uL (ref 4.0–10.5)

## 2012-10-27 LAB — PROTIME-INR: INR: 1.14 (ref 0.00–1.49)

## 2012-10-27 MED ORDER — WARFARIN SODIUM 10 MG PO TABS
10.0000 mg | ORAL_TABLET | Freq: Once | ORAL | Status: AC
  Administered 2012-10-27: 10 mg via ORAL
  Filled 2012-10-27: qty 1

## 2012-10-27 MED ORDER — METOPROLOL SUCCINATE ER 50 MG PO TB24
150.0000 mg | ORAL_TABLET | Freq: Every day | ORAL | Status: DC
  Administered 2012-10-27 – 2012-10-28 (×2): 150 mg via ORAL
  Filled 2012-10-27 (×3): qty 1

## 2012-10-27 MED ORDER — GUAIFENESIN-DM 100-10 MG/5ML PO SYRP
5.0000 mL | ORAL_SOLUTION | ORAL | Status: DC | PRN
  Administered 2012-10-27 – 2012-10-28 (×4): 5 mL via ORAL
  Filled 2012-10-27 (×4): qty 5

## 2012-10-27 MED ORDER — LISINOPRIL 2.5 MG PO TABS
2.5000 mg | ORAL_TABLET | Freq: Every day | ORAL | Status: DC
  Administered 2012-10-27 – 2012-10-29 (×3): 2.5 mg via ORAL
  Filled 2012-10-27 (×3): qty 1

## 2012-10-27 MED FILL — Dextrose Inj 5%: INTRAVENOUS | Qty: 50 | Status: AC

## 2012-10-27 NOTE — Progress Notes (Signed)
ANTICOAGULATION CONSULT NOTE - Follow Up Consult  Pharmacy Consult for Coumadin Indication: Afib No Known Allergies  Patient Measurements: Height: 5\' 10"  (177.8 cm) Weight: 225 lb 15.5 oz (102.5 kg) IBW/kg (Calculated) : 73   Vital Signs: Temp: 98.1 F (36.7 C) (11/12 0738) Temp src: Oral (11/12 0738) BP: 115/71 mmHg (11/12 0738) Pulse Rate: 60  (11/12 0738)  Labs:  Basename 10/27/12 0500 10/26/12 0510 10/25/12 0505 10/24/12 1403  HGB 14.6 14.2 -- --  HCT 41.7 41.6 39.3 --  PLT 224 244 211 --  APTT -- -- -- --  LABPROT 14.4 -- -- --  INR 1.14 -- -- --  HEPARINUNFRC -- 0.64 0.47 0.45  CREATININE 0.97 -- -- --  CKTOTAL -- -- -- --  CKMB -- -- -- --  TROPONINI -- -- -- --    Estimated Creatinine Clearance: 76.5 ml/min (by C-G formula based on Cr of 0.97).   Assessment: 78 yo male with h/o atrial fibrillation transferred from [facility] with NSTEMI. Patient was on coumadin pta which has been placed on hold. S/p staged PCI and coumadin restarted 11/11. INR today= 1.14.Coumadin PTA dose: Take 10 mg everyday except Monday and Friday take 5 mg  Goal of Therapy:  INR 2-3   Plan:  1. Coumadin 10mg  po x 1 2. F/u daily INR  Harland German, Pharm D 10/27/2012 8:22 AM   e

## 2012-10-27 NOTE — Progress Notes (Signed)
Brief Nutrition Note:   Pt triggered for unsure of weight loss generating a MST (Malnutrition Screening Tool) score of 2.  Pt is eating 100% of most meals. Weight has decreased appropriately with diuretics. Fluid status -8L.  Admission weight: 235 lbs Weight 11/11: 225 lbs Body mass index is 32.42 kg/(m^2). Obesity class 1.   Pt denied any nutrition needs at this time. Please consult as needed.   Clarene Duke RD, LDN Pager [phone] After Hours pager [phone]

## 2012-10-27 NOTE — Progress Notes (Signed)
    Subjective:  Feels ok. Hasn't been up out of bed. No chest pain or dyspnea.  Objective:  Vital Signs in the last 24 hours: Temp:  [97.6 F (36.4 C)-98.5 F (36.9 C)] 97.7 F (36.5 C) (11/12 0412) Pulse Rate:  [60-111] 60  (11/12 0300) Resp:  [18-20] 18  (11/12 0400) BP: (99-125)/(60-73) 119/68 mmHg (11/12 0400) SpO2:  [93 %-97 %] 96 % (11/12 0412)  Intake/Output from previous day: 11/11 0701 - 11/12 0700 In: 1107.8 [P.O.:120; I.V.:987.8] Out: 2650 [Urine:2650]  Physical Exam: Pt is alert and oriented, NAD HEENT: normal Neck: JVP - normal, carotids 2+= without bruits Lungs: CTA bilaterally CV: irregularly irregular without murmur or gallop Abd: soft, NT, Positive BS, no hepatomegaly Ext: no C/C/E, distal pulses intact and equal. Right radial site clear. Right groin hematoma tender, no bruit. Skin: warm/dry no rash  Lab Results:  Basename 10/27/12 0500 10/26/12 0510  WBC 10.2 8.4  HGB 14.6 14.2  PLT 224 244    Basename 10/27/12 0500  NA 137  K 3.7  CL 103  CO2 24  GLUCOSE 135*  BUN 29*  CREATININE 0.97   No results found for this basename: TROPONINI:2,CK,MB:2 in the last 72 hours  Tele: atrial fib heart rate 90's  Assessment/Plan:  1. NSTEMI - s/p LCx PCI (DES), now has had staged PCI of the RCA for severe diffuse disease (>90 diffuse stenosis). Continue current Rx with ASA, brilinta until INR therapeutic, then stop ASA.  2. AFib - high stroke risk with DM and severe LV dysfunction. Warfarin restarted.   3. Ischemic cardiomyopathy. - LVEF less than 30%. Plan reassess LV function in 3 months. Change beta-blocker to Toprol XL considering severe LV dysfunction. Add ACE-I at low-dose today.  4. Right groin hematoma - check ultrasound rule out pseudoaneurysm. No bruit.  5. Dispo - mobilize today and consider d/c home next 24-48 hours.  [name], M.D. 10/27/2012, 7:29 AM

## 2012-10-27 NOTE — Progress Notes (Signed)
CARDIAC REHAB PHASE I   PRE:  Rate/Rhythm: 115 afib  BP:  Supine: 126/80  Sitting:   Standing:    SaO2:   MODE:  Ambulation: 500 ft   POST:  Rate/Rhythem: 145 Afib  BP:  Supine:   Sitting: 143/73  Standing:    SaO2:  1105-1130 Pt tolerated ambulation well without c/o of cp or SOB. Gait steady . Pt is DOE but no c.o, states that he has been SOB at home for several months prior to coming to hospital. Heart rate up 145 with ambulation, pt denies any c/o with increased HR, states that "I can not feel it" Discussed with pt that he would be on  ASA, Brlinita and Coumadin at discharge.Pt's HR decreased some with rest, but still staying 120's. Reported to RN  Kizzie Bane, Nila Nephew

## 2012-10-28 ENCOUNTER — Inpatient Hospital Stay (HOSPITAL_COMMUNITY): Payer: Medicare Other

## 2012-10-28 LAB — BASIC METABOLIC PANEL
CO2: 22 mEq/L (ref 19–32)
Calcium: 9 mg/dL (ref 8.4–10.5)
Creatinine, Ser: 1.04 mg/dL (ref 0.50–1.35)
GFR calc non Af Amer: 67 mL/min — ABNORMAL LOW (ref >90)
Glucose, Bld: 135 mg/dL — ABNORMAL HIGH (ref 70–99)
Sodium: 135 mEq/L (ref 135–145)

## 2012-10-28 LAB — GLUCOSE, CAPILLARY

## 2012-10-28 LAB — CBC
MCHC: 34.8 g/dL (ref 30.0–36.0)
MCV: 87.8 fL (ref 78.0–100.0)
Platelets: 258 10*3/uL (ref 150–400)
RDW: 14.1 % (ref 11.5–15.5)
WBC: 11.4 10*3/uL — ABNORMAL HIGH (ref 4.0–10.5)

## 2012-10-28 LAB — PROTIME-INR: INR: 1.18 (ref 0.00–1.49)

## 2012-10-28 MED ORDER — DIGOXIN 0.25 MG/ML IJ SOLN
0.2500 mg | Freq: Once | INTRAMUSCULAR | Status: AC
  Administered 2012-10-28: 0.25 mg via INTRAVENOUS
  Filled 2012-10-28 (×2): qty 1

## 2012-10-28 MED ORDER — DIGOXIN 125 MCG PO TABS
0.1250 mg | ORAL_TABLET | Freq: Every day | ORAL | Status: DC
  Filled 2012-10-28: qty 1

## 2012-10-28 MED ORDER — DIGOXIN 0.25 MG/ML IJ SOLN
0.2500 mg | Freq: Once | INTRAMUSCULAR | Status: AC
  Administered 2012-10-28: 0.25 mg via INTRAVENOUS
  Filled 2012-10-28: qty 1

## 2012-10-28 MED ORDER — WARFARIN SODIUM 2.5 MG PO TABS
12.5000 mg | ORAL_TABLET | Freq: Once | ORAL | Status: AC
  Administered 2012-10-28: 12.5 mg via ORAL
  Filled 2012-10-28: qty 1

## 2012-10-28 NOTE — Progress Notes (Signed)
    Subjective:  No chest pain. Reports dyspnea with walking. Continues to cough up phlegm.  Objective:  Vital Signs in the last 24 hours: Temp:  [97.6 F (36.4 C)-98.9 F (37.2 C)] 98.3 F (36.8 C) (11/13 0325) Pulse Rate:  [60-105] 88  (11/13 0325) Resp:  [18-20] 18  (11/13 0325) BP: (107-120)/(44-77) 120/74 mmHg (11/12 1951) SpO2:  [89 %-100 %] 96 % (11/13 0325)  Intake/Output from previous day: 11/12 0701 - 11/13 0700 In: -  Out: 700 [Urine:700]  Physical Exam: Pt is alert and oriented, NAD HEENT: normal Neck: JVP - normal, carotids 2+= without bruits Lungs: decreased BS right lung base, otherwise clear CV: tachy and irregular without murmur or gallop Abd: soft, NT, Positive BS, no hepatomegaly Ext: no C/C/E, distal pulses intact and equal. Right groin hematoma softer and less tender Skin: warm/dry no rash  Lab Results:  Basename 10/28/12 0415 10/27/12 0500  WBC 11.4* 10.2  HGB 14.5 14.6  PLT 258 224    Basename 10/28/12 0415 10/27/12 0500  NA 135 137  K 3.7 3.7  CL 98 103  CO2 22 24  GLUCOSE 135* 135*  BUN 32* 29*  CREATININE 1.04 0.97   No results found for this basename: TROPONINI:2,CK,MB:2 in the last 72 hours  Tele: atrial fibrillation HR 100-140's  Assessment/Plan:  1. NSTEMI - Continue ASA 81 mg, Brilinta 90 mg BID, and warfarin. Stop ASA when INR therapeutic. With P2Y12 of 33, there is a clear therapeutic effect of Brilinta.  2. Atrial fibrillation with RVR - heart rate not well-controlled. Add Digoxin with IV load today. Continue warfarin.  3. Severe ischemic CM - cont b-blocker and ACE.  4. Cough - repeat CXR today  [name], M.D. 10/28/2012, 6:56 AM

## 2012-10-28 NOTE — Progress Notes (Signed)
Note written in error.

## 2012-10-28 NOTE — Progress Notes (Signed)
ANTICOAGULATION CONSULT NOTE - Follow Up Consult  Pharmacy Consult for Coumadin Indication: atrial fibrillation  No Known Allergies  Patient Measurements: Height: 5\' 10"  (177.8 cm) Weight: 225 lb 15.5 oz (102.5 kg) IBW/kg (Calculated) : 73   Vital Signs: Temp: 98.4 F (36.9 C) (11/13 0825) Temp src: Oral (11/13 0825) BP: 102/57 mmHg (11/13 0825) Pulse Rate: 94  (11/13 0825)  Labs:  Basename 10/28/12 0415 10/27/12 0500 10/26/12 0510  HGB 14.5 14.6 --  HCT 41.7 41.7 41.6  PLT 258 224 244  APTT -- -- --  LABPROT 14.8 14.4 --  INR 1.18 1.14 --  HEPARINUNFRC -- -- 0.64  CREATININE 1.04 0.97 --  CKTOTAL -- -- --  CKMB -- -- --  TROPONINI -- -- --    Estimated Creatinine Clearance: 71.3 ml/min (by C-G formula based on Cr of 1.04).  Assessment: 78 year old male with a history of atrial fibrillation, who was transferred from [facility] with NSTEMI. He was on Coumadin prior to admission, and it was placed on hold at admission. S/P staged PCI and Coumadin has been resumed since 11/11. INR today is 1.18, which is a slight increase from 11/12 (1.14).  His home dose of Coumadin was 10 mg every day except 5 mg on Monday and Friday.  Hemoglobin stable and platelets are trending up. No reported bleeding.  Goal of Therapy:  INR 2-3 Monitor platelets by anticoagulation protocol: Yes   Plan:  1. Coumadin 12.5 mg PO x 1 2. Follow up daily INR  Juanita Craver PharmD Candidate 10/28/2012,9:57 AM  I have reviewed the above and agree with the plan noted.  Harland German, Pharm D 10/28/2012 11:18 AM

## 2012-10-28 NOTE — Progress Notes (Signed)
CARDIAC REHAB PHASE I   PRE:  Rate/Rhythm: 90 Afib  BP:  Supine:   Sitting: 100/62  Standing:    SaO2: 94 RA  MODE:  Ambulation: 700 ft   POST:  Rate/Rhythem: 122 Afib  BP:  Supine:   Sitting: 121/56  Standing:    SaO2: 93-94 RA [id number] Assisted X 1 to ambulate. Gait steady Pt walked 700 feet. Heart rate during walk 120 after walk 122. Pt without c/o during walk. RA sat checked throughout walk 93-94%. Pt back to recliner after walk with call light in reach. Beatrix Fetters

## 2012-10-29 ENCOUNTER — Encounter (HOSPITAL_COMMUNITY): Payer: Self-pay | Admitting: Physician Assistant

## 2012-10-29 LAB — CBC
HCT: 41.9 % (ref 39.0–52.0)
MCHC: 33.9 g/dL (ref 30.0–36.0)
MCV: 88.2 fL (ref 78.0–100.0)
RDW: 14.2 % (ref 11.5–15.5)

## 2012-10-29 LAB — GLUCOSE, CAPILLARY: Glucose-Capillary: 131 mg/dL — ABNORMAL HIGH (ref 70–99)

## 2012-10-29 MED ORDER — ATORVASTATIN CALCIUM 80 MG PO TABS: 80.0000 mg | ORAL_TABLET | Freq: Every day | ORAL | Status: DC

## 2012-10-29 MED ORDER — DIGOXIN 250 MCG PO TABS: 0.2500 mg | ORAL_TABLET | Freq: Every day | ORAL | Status: DC

## 2012-10-29 MED ORDER — POTASSIUM CHLORIDE CRYS ER 20 MEQ PO TBCR: 20.0000 meq | EXTENDED_RELEASE_TABLET | Freq: Every day | ORAL | Status: DC

## 2012-10-29 MED ORDER — NITROGLYCERIN 0.4 MG SL SUBL: 0.4000 mg | SUBLINGUAL_TABLET | SUBLINGUAL | Status: DC | PRN

## 2012-10-29 MED ORDER — METOPROLOL SUCCINATE ER 100 MG PO TB24: 100.0000 mg | ORAL_TABLET | Freq: Two times a day (BID) | ORAL | Status: DC

## 2012-10-29 MED ORDER — DIGOXIN 250 MCG PO TABS
0.2500 mg | ORAL_TABLET | Freq: Every day | ORAL | Status: DC
  Administered 2012-10-29: 0.25 mg via ORAL
  Filled 2012-10-29: qty 1

## 2012-10-29 MED ORDER — WARFARIN SODIUM 2.5 MG PO TABS
12.5000 mg | ORAL_TABLET | ORAL | Status: DC
  Filled 2012-10-29: qty 1

## 2012-10-29 MED ORDER — METOPROLOL SUCCINATE ER 100 MG PO TB24
100.0000 mg | ORAL_TABLET | Freq: Two times a day (BID) | ORAL | Status: DC
  Filled 2012-10-29: qty 1

## 2012-10-29 MED ORDER — FUROSEMIDE 40 MG PO TABS: 40.0000 mg | ORAL_TABLET | Freq: Two times a day (BID) | ORAL | Status: DC

## 2012-10-29 MED ORDER — LISINOPRIL 2.5 MG PO TABS: 2.5000 mg | ORAL_TABLET | Freq: Every day | ORAL | Status: DC

## 2012-10-29 MED ORDER — WARFARIN SODIUM 10 MG PO TABS: 10.0000 mg | ORAL_TABLET | ORAL | Status: DC

## 2012-10-29 MED ORDER — TICAGRELOR 90 MG PO TABS: 90.0000 mg | ORAL_TABLET | Freq: Two times a day (BID) | ORAL | Status: DC

## 2012-10-29 NOTE — Progress Notes (Signed)
ANTICOAGULATION CONSULT NOTE - Follow Up Consult  Pharmacy Consult for Coumadin Indication: atrial fibrillation  No Known Allergies  Patient Measurements: Height: 5\' 10"  (177.8 cm) Weight: 225 lb 15.5 oz (102.5 kg) IBW/kg (Calculated) : 73   Vital Signs: Temp: 97.8 F (36.6 C) (11/14 0806) Temp src: Oral (11/14 0806) BP: 106/62 mmHg (11/14 0806) Pulse Rate: 86  (11/14 0806)  Labs:  Basename 10/29/12 0430 10/28/12 0415 10/27/12 0500  HGB 14.2 14.5 --  HCT 41.9 41.7 41.7  PLT 271 258 224  APTT -- -- --  LABPROT 16.1* 14.8 14.4  INR 1.32 1.18 1.14  HEPARINUNFRC -- -- --  CREATININE -- 1.04 0.97  CKTOTAL -- -- --  CKMB -- -- --  TROPONINI -- -- --    Estimated Creatinine Clearance: 71.3 ml/min (by C-G formula based on Cr of 1.04).  Assessment: 78 year old male with a history of atrial fibrillation, who was transferred from [facility] with NSTEMI. He was on Coumadin prior to admission and has been on it since 1998 with no problems except a large bruise on his leg years ago. He reports that his INR has been at goal for the majority of the time he has been on Coumadin.  Coumadin was placed on hold at admission. S/P staged PCI and Coumadin has been resumed since 11/11. INR today is 1.32, which is slowly trending up. He will not be bridged due to the presence of a groin hematoma and highly inhibited platelets based on PRU value.  His home dose of Coumadin was 10 mg every day except 5 mg on Monday and Friday.   Hemoglobin stable and platelets are trending up. No reported bleeding.   Goal of Therapy:  INR 2-3 Monitor platelets by anticoagulation protocol: Yes   Plan:  1. Coumadin 12.5 mg PO x 1 2. Follow up daily INR  Juanita Craver PharmD Candidate 10/29/2012,9:07 AM

## 2012-10-29 NOTE — Progress Notes (Signed)
I have reviewed Stacey's note, and agree with her assessment and plan.  Would expect INR rise soon. Reece Leader, Pharm D 10/29/2012 10:24 AM

## 2012-10-29 NOTE — Progress Notes (Signed)
CARDIAC REHAB PHASE I   PRE:  Rate/Rhythm: 88afib  BP:  Supine:   Sitting: 105/56  Standing:    SaO2:   MODE:  Ambulation: 700 ft   POST:  Rate/Rhythem: 124afib  BP:  Supine:   Sitting: 124/71  Standing:    SaO2:  0950-1023 Pt walked 700 ft with steady gait. Tolerated well. Education completed. Gave CHF packet and reviewed green,yellow and red zones and when to call MD. Pt states he weighs daily. Discussed NTG use., watching sodium,and ex ed. Pt states he lost weight in past with computer program watching carbs, sodium and fats. Permission given to refer to [facility] Phase 2.  Duanne Limerick

## 2012-10-29 NOTE — Progress Notes (Addendum)
    Subjective:  Breathing is better. No chest pain.  Objective:  Vital Signs in the last 24 hours: Temp:  [97.5 F (36.4 C)-98.6 F (37 C)] 98.4 F (36.9 C) (11/14 0412) Pulse Rate:  [87-119] 87  (11/13 1719) BP: (102-109)/(52-59) 106/52 mmHg (11/14 0400) SpO2:  [94 %-96 %] 95 % (11/14 0412)  Intake/Output from previous day: 11/13 0701 - 11/14 0700 In: 960 [P.O.:960] Out: 600 [Urine:600]  Physical Exam: Pt is alert and oriented, NAD HEENT: normal Neck: JVP - normal, carotids 2+= without bruits Lungs: decreased BS right base, otherwise clear CV: irregularly irregular without murmur or gallop Abd: soft, NT, Positive BS, no hepatomegaly Ext: no C/C/E, distal pulses intact and equal Skin: warm/dry no rash   Lab Results:  Basename 10/29/12 0430 10/28/12 0415  WBC 9.8 11.4*  HGB 14.2 14.5  PLT 271 258    Basename 10/28/12 0415 10/27/12 0500  NA 135 137  K 3.7 3.7  CL 98 103  CO2 22 24  GLUCOSE 135* 135*  BUN 32* 29*  CREATININE 1.04 0.97   No results found for this basename: TROPONINI:2,CK,MB:2 in the last 72 hours  Tele: Atrial fib - HR 80-120  CXR: IMPRESSION:  Chronic pulmonary venous congestion and basilar atelectasis without  definite acute cardiopulmonary disease.   Assessment/Plan:  1. NSTEMI - s/p PCI 2 vessel 2. AFib with RVR, chronic 3. Acute on chronic systolic HF secondary to severe mixed cardiomyopathy  Heart rate is better controlled after addition of digoxin. INR beginning to trend up. I don't think he should be bridged with presence of groin hematoma and highly inhibited platelets based on PRU value. Will plan on discontinuation of ASA and continue brilinta and warfarin for treatment of recent ACS with stents and chronic AF. Patient will need close follow-up with Dr Andee Lineman next week to check his progress. I am going to change his Toprol XL to BID dosing considering his CHF with need for better rate control of AF. BP is tolerating a low-dose of  ACE-I and I would not push dose yet - can be done as outpatient as BP remains 'low-normal.' Should have a dig level next week as well.  [name], M.D. 10/29/2012, 7:22 AM

## 2012-10-29 NOTE — Discharge Summary (Signed)
Discharge Summary   Patient ID: George Pollard MRN: [id number], DOB/AGE: 76-Mar-1934 76 y.o. Admit date: 10/19/2012 D/C date:     10/29/2012  Primary Cardiologist: Andee Lineman [facility])  Primary Discharge Diagnoses:  1. CAD - NSTEMI (troponin >20) s/p PTCA/DES to LCx 10/21/12, overlapping DES to mid & distal RCA 10/26/12 - hx: CABG 1997 2. Acute on chronic systolic CHF / pulmonary edema secondary to severe mixed cardiomyopathy - EF 25% by echo this admission 3. Atrial fibrillation, on chronic Coumadin - elevated rates this admission 4. HTN 5. Diabetes mellitus, in the past resolved with weight los - A1C 6.9 this admission - will need to f/u PCP 6. R groin hematoma, improving  Secondary Discharge Diagnoses:  1. BPH 2. PVD s/p AAA repair 2001  Hospital Course: 78 y/o M with history of CAD s/p CABG 1997, atrial fibrillation on chronic Coumadin presented to [facility] initially with chest pain. He was in afib with RVR and was treated with Cardizem. His second troponin was elevated. Dr. Andee Lineman was consulted and transferred the patient to [facility] 10/19/12 for further evaluation and catheterization. Prior to transfer bedside echo demonstrated an EF of 35% with severe posterior hypokinesis (full echo here - EF 25% with diffuse HK). He was treated with vitamin K given his therapeutic INR, Coumadin was held, and heparin was started as crossover. Troponin peaked here at >20. Given that he was pain free and had completed his cardiac event with no injury current on EKG, cath was deferred until INR subtherapeutic. On the evening of 10/20/12, the patient developed SOB, increased WOB and exam consistent with acute on chronic systolic chf/acute pulmonary edema in the setting of afib and IVF. He was treated with IV Lasix and fluids were discontinued. He underwent cardiac cath 10/21/12 demonstrating: 1. Triple vessel CAD s/p 3V CABG with 2/3 patent bypass grafts.  2. Severe stenosis proximal  Circumflex, culprit vessel. Now s/p successful PTCA/DES x 1 proximal Circumflex.  3. Stenosis in mid body of vein graft to Diagonal  4. Severe stenosis mid RCA, occluded vein graft to the distal RCA  5. Ischemic cardiomyopathy based on reduced LVEF on echo.  Dr. Clifton James considered redo CABG but the patient did not wish to consider CABG. Brilinta was started as well as high dose statin. Dr. Clifton James felt that each lesion could be approached percutaneously. Mr. Leonhardt underwent PTCA/DES to the prox Cx on 11/6 with plans for staged PCI. His medications were titrated in the interim for rapid afib heart rates. He went back to the cath lab 10/26/12 and had successful complex PCI of the mid and distal right coronary artery using overlapping drug-eluting stents. P2Y12 noted adequate at 33. The patient was noted to have a R groin hematoma - discussed with Dr. Excell Seltzer today - this has improved over time so Mr. Geier does not require vascular imaging at this time. Digoxin was loaded for his afib for additional rate control. BB was titrated and ACEI was added. Today the patient ambulated well with cardiac rehab. Dr. Excell Seltzer has seen and examined him today and feels he is stable for discharge. Given the presence of groin hematoma and highly inhibited platelets based on PRU value, Dr. Excell Seltzer does not think the patient should be bridged. He recommended discontinuation of ASA and continuing brilinta and warfarin for treatment of recent ACS with stents and chronic AF. He also would like the patient to continue current dose of Lasix and KCl as noted below.  Of note, the  patient has a history of DM resolved with weight loss in the past - A1C was 6.9 this admission. Discussed with Dr. Excell Seltzer - the patient was instructed on the importance of follow-up with his primary care doctor for further evaluation/management including supply provision. The patient will need close follow-up with Dr. Andee Lineman next week to check his progress  as well as a digoxin level. Dr. Excell Seltzer and Dr. Andee Lineman discussed this patient this morning.  I spoke with the [facility] scheduling line.  I requested an appointment for 1 week with Dr. Andee Lineman and was told he would be scheduled for the first available appointment (12/10/12 at 1:30pm) and would be put on a waiting list to be moved up, but that the scheduler will be sending an email to Dr. Andee Lineman for authorization to move this up sooner. The scheduler also acknowledged our request for a digoxin level in 1 week as well. The patient will be instructed to call his office if he has not heard from them by Monday 11/02/12 regarding the appointment/digoxin level. The patient gets his INR checked from his primary care office and he already plans to schedule a check for Monday 11/18 (he thinks he actually may already have one scheduled for this day). Lab slips were provided for both the INR check and digoxin level should he need them. Per discussion with pharmacy, their recommendations are for the patient to receive 12.5mg  today to go home with, then continue 10mg  daily Fri/Sat/Sun with INR check on Monday 11/02/12.   Discharge Vitals: Blood pressure 106/62, pulse 86, temperature 97.8 F (36.6 C), temperature source Oral, resp. rate 18, height 5\' 10"  (1.778 m), weight 225 lb 15.5 oz (102.5 kg), SpO2 96.00%.  Labs: Lab Results  Component Value Date   WBC 9.8 10/29/2012   HGB 14.2 10/29/2012   HCT 41.9 10/29/2012   MCV 88.2 10/29/2012   PLT 271 10/29/2012    Lab 10/28/12 0415  NA 135  K 3.7  CL 98  CO2 22  BUN 32*  CREATININE 1.04  CALCIUM 9.0  PROT --  BILITOT --  ALKPHOS --  ALT --  AST --  GLUCOSE 135*    Lab Results  Component Value Date   CHOL 121 10/24/2012   HDL 40 10/24/2012   LDLCALC 63 10/24/2012   TRIG 88 10/24/2012     Diagnostic Studies/Procedures   1. 2D Echo 10/20/12 Study Conclusions - Left ventricle: The cavity size was normal. There was mild concentric hypertrophy.  The estimated ejection fraction was 25%. Diffuse hypokinesis. There is akinesis of the inferolateral and inferior myocardium. - Mitral valve: Mild regurgitation. - Left atrium: The atrium was moderately dilated. - Right atrium: The atrium was moderately dilated.  2. Initial diagnostic cath 10/21/12 Hemodynamic Findings:  Central aortic pressure: 96/67  Left ventricular pressure: 98/14/21  Angiographic Findings:  Left main: 10% stenosis.  Left Anterior Descending Artery: 100% ostial occlusion. The mid and distal vessel fills from the patent LIMA graft.  Circumflex Artery: Large caliber vessel with 99% stenosis in the proximal vessel just beyond the early first OM branch. The second OM branch arises just beyond the severe stenosis. The remainder of this vessel is disease free.  Right Coronary Artery: Large caliber, dominant vessel with 30% proximal stenosis, diffuse 80-90% stenosis mid vessel with discreet 99% stenosis in the mid vessel. The distal vessel has mild plaque. The touchdown site of the occluded vein graft is seen in the distal vessel just before the bifurcation. Both the  PDA and PLA are moderate caliber vessels with mild plaque disease.  Graft Anatomy:  LIMA to LAD is patent  SVG to Diagonal is patent. The mid body of the vein graft has a prominent valve followed by a 30%, hazy stenosis just beyond the valve.  SVG to PDA is occluded  Left Ventricular Angiogram: Deferred.  Impression:  1. Triple vessel CAD s/p 3V CABG with 2/3 patent bypass grafts.  2. Severe stenosis proximal Circumflex, culprit vessel. Now s/p successful PTCA/DES x 1 proximal Circumflex.  3. Stenosis in mid body of vein graft to Diagonal  4. Severe stenosis mid RCA, occluded vein graft to the distal RCA  5. Ischemic cardiomyopathy based on reduced LVEF on echo.  Recommendations: I carefully reviewed the anatomy before the PCI. I considered redo CABG but the patient did not wish to consider CABG. I felt that each  lesion could be approached percutaneously. Since he will need a long segment of stenting in the RCA, a drug eluting platform was chosen for the Circumflex. There was an excellent result today in his culprit vessel, the Circumflex. Will continue ASA and Brilinta for now. Will review films with colleagues and plan PCI of the RCA before discharge (this would be best if this could be done before his coumadin is resumed). May also consider PCI of the vein graft to the Diagonal. I would plan on stopping his ASA in one month since he will need to be on Brilinta and coumadin.   3. Chest Port 1 View 10/28/2012  *RADIOLOGY REPORT*  Clinical Data: Cough, shortness of breath  PORTABLE CHEST - 1 VIEW  Comparison: 10/23/2012; 10/19/2012; 04/05/2009  Findings:  Grossly unchanged enlarged cardiac silhouette and mediastinal contours post median sternotomy.  Atherosclerotic calcifications within the aortic arch.  Mild pulmonary venous congestion without frank evidence of edema.  Chronic bibasilar opacities, right slightly greater than left.  No new focal airspace opacity.  No definite pleural effusion, though note, the bilateral costophrenic angles exclude from view.  No pneumothorax.  Unchanged bones.  IMPRESSION: Chronic pulmonary venous congestion and basilar atelectasis without definite acute cardiopulmonary disease.   Original Report Authenticated By: Tacey Ruiz, MD    4. Chest Port 1 View 10/23/2012  *RADIOLOGY REPORT*  Clinical Data: Cough, CHF  PORTABLE CHEST - 1 VIEW  Comparison: 10/22/2012  Findings: Cardiomegaly with central vascular congestion.  Mild interstitial prominence and peribronchial thickening.  Linear opacities at the right greater than left lung bases.  Status post median sternotomy and CABG.  Aortic arch atherosclerosis.  No pneumothorax or pleural effusion.  No acute osseous finding.  IMPRESSION: Mild interstitial edema pattern is similar.  Mild bibasilar linear opacities, favor atelectasis.   Original  Report Authenticated By: Jearld Lesch, M.D.    Dg Chest Port 1 View  10/20/2012  *RADIOLOGY REPORT*  Clinical Data: Pulmonary edema  PORTABLE CHEST - 1 VIEW  Comparison: 10/19/2012  Findings: Cardiomegaly with suspected mild interstitial edema. Bilateral lower lobe opacities, likely atelectasis.  No pleural effusion or pneumothorax.  Postsurgical changes related to prior CABG.  IMPRESSION: Cardiomegaly with suspected mild interstitial edema.   Original Report Authenticated By: Charline Bills, M.D.    5. Chest Port 1v Same Day 10/22/2012  *RADIOLOGY REPORT*  Clinical Data: Congestive failure  PORTABLE CHEST - 1 VIEW SAME DAY  Comparison: 10/20/2012  Findings: Mild cardiomegaly is again noted.  Postsurgical changes are again seen.  There are changes of vascular congestion with very mild interstitial edema.  No focal  confluent infiltrate is seen. No sizable effusion is noted.  IMPRESSION: Stable congestive failure with mild interstitial edema.   Original Report Authenticated By: Alcide Clever, M.D.     Discharge Medications   Current Discharge Medication List    START taking these medications   Details  atorvastatin (LIPITOR) 80 MG tablet Take 1 tablet (80 mg total) by mouth daily. Qty: 30 tablet, Refills: 2    digoxin (LANOXIN) 0.25 MG tablet Take 1 tablet (0.25 mg total) by mouth daily. Qty: 30 tablet, Refills: 2    furosemide (LASIX) 40 MG tablet Take 1 tablet (40 mg total) by mouth 2 (two) times daily. Qty: 60 tablet, Refills: 2    lisinopril (PRINIVIL,ZESTRIL) 2.5 MG tablet Take 1 tablet (2.5 mg total) by mouth daily. Qty: 30 tablet, Refills: 2    metoprolol succinate (TOPROL-XL) 100 MG 24 hr tablet Take 1 tablet (100 mg total) by mouth 2 (two) times daily. Qty: 60 tablet, Refills: 2    nitroGLYCERIN (NITROSTAT) 0.4 MG SL tablet Place 1 tablet (0.4 mg total) under the tongue every 5 (five) minutes as needed for chest pain (up to 3 doses). Qty: 25 tablet, Refills: 2    potassium  chloride SA (K-DUR,KLOR-CON) 20 MEQ tablet Take 1 tablet (20 mEq total) by mouth daily. Qty: 30 tablet, Refills: 2    Ticagrelor (BRILINTA) 90 MG TABS tablet Take 1 tablet (90 mg total) by mouth 2 (two) times daily. Qty: 60 tablet, Refills: 2      CONTINUE these medications which have CHANGED   Details  warfarin (COUMADIN) 10 MG tablet Take 1 tablet (10 mg total) by mouth as directed. You will be given 12.5mg  to take tonight (10/29/12). Take one 10mg  tablet by mouth Friday (11/15), Saturday (11/16), and Sunday (11/17). Please have your Coumadin level ("INR") checked on Monday 11/02/12.      STOP taking these medications     metoprolol (LOPRESSOR) 50 MG tablet Comments:  Reason for Stopping: substituted      terazosin (HYTRIN) 1 MG capsule Comments:  Reason for Stopping: on admission         Disposition   The patient will be discharged in stable condition to home. Discharge Orders    Future Orders Please Complete By Expires   Amb Referral to Cardiac Rehabilitation      Comments:   Referring to [facility] Phase 2   Diet - low sodium heart healthy      Comments:   Diabetic Diet   Increase activity slowly      Comments:   No driving for 10 days. No lifting over 10 lbs for 4 weeks. No sexual activity for 4 weeks. Keep procedure site clean & dry. If you notice increased pain, swelling, bleeding or pus, call/return!  You may shower, but no soaking baths/hot tubs/pools for 1 week.   Discharge instructions      Comments:   Your further refills should come from Dr. Margarita Mail office.     Follow-up Information    Follow up with Peyton Bottoms, MD. (You are tentatively scheduled for 12/10/12 at 1:30pm, however, they will be calling you to move you up. Please call the office if you have not heard from them by Monday. You will also need a DIGOXIN LEVEL in 1 week and they will call you to arrange that.)    Contact information:   [phone]      Follow up with [facility], MD. (1) Please call  to schedule a Coumadin level check  for Monday 11/02/12. 2) To discuss further evaluation and management of your diabetes (VERY important))    Contact information:   [address]  [place] [address] [phone]            Duration of Discharge Encounter: Greater than 30 minutes including physician and PA time.  Signed, Ronie Spies PA-C 10/29/2012, 12:42 PM

## 2012-10-29 NOTE — Care Management (Signed)
Pt d/c instuctions done and pt expressed understanding. Diet, exercise, meds and f/u appt discussed

## 2012-10-30 ENCOUNTER — Other Ambulatory Visit: Payer: Self-pay | Admitting: Cardiovascular Disease

## 2012-11-05 LAB — CYP450 2C19 MUTATIONS

## 2012-12-18 ENCOUNTER — Other Ambulatory Visit (HOSPITAL_COMMUNITY): Payer: Self-pay | Admitting: Internal Medicine

## 2013-01-30 ENCOUNTER — Other Ambulatory Visit: Payer: Self-pay

## 2013-07-21 ENCOUNTER — Other Ambulatory Visit: Payer: Self-pay

## 2013-10-21 ENCOUNTER — Other Ambulatory Visit: Payer: Self-pay

## 2014-09-30 ENCOUNTER — Other Ambulatory Visit: Payer: Self-pay

## 2014-11-24 ENCOUNTER — Encounter (HOSPITAL_COMMUNITY): Payer: Self-pay | Admitting: Cardiovascular Disease

## 2016-02-12 ENCOUNTER — Emergency Department (HOSPITAL_COMMUNITY): Payer: Medicare Other

## 2016-02-12 ENCOUNTER — Emergency Department (HOSPITAL_COMMUNITY)
Admission: EM | Admit: 2016-02-12 | Discharge: 2016-02-12 | Disposition: A | Discharge status: Home / Self Care | Payer: Medicare Other | Attending: Emergency Medicine | Admitting: Emergency Medicine

## 2016-02-12 ENCOUNTER — Encounter (HOSPITAL_COMMUNITY): Payer: Self-pay | Admitting: *Deleted

## 2016-02-12 DIAGNOSIS — I5020 Unspecified systolic (congestive) heart failure: Secondary | ICD-10-CM | POA: Diagnosis not present

## 2016-02-12 DIAGNOSIS — Z87891 Personal history of nicotine dependence: Secondary | ICD-10-CM | POA: Diagnosis not present

## 2016-02-12 DIAGNOSIS — Z951 Presence of aortocoronary bypass graft: Secondary | ICD-10-CM | POA: Insufficient documentation

## 2016-02-12 DIAGNOSIS — M25511 Pain in right shoulder: Secondary | ICD-10-CM | POA: Diagnosis not present

## 2016-02-12 DIAGNOSIS — Z79899 Other long term (current) drug therapy: Secondary | ICD-10-CM | POA: Insufficient documentation

## 2016-02-12 DIAGNOSIS — Z9889 Other specified postprocedural states: Secondary | ICD-10-CM | POA: Insufficient documentation

## 2016-02-12 DIAGNOSIS — I10 Essential (primary) hypertension: Secondary | ICD-10-CM | POA: Diagnosis not present

## 2016-02-12 DIAGNOSIS — I4891 Unspecified atrial fibrillation: Secondary | ICD-10-CM | POA: Diagnosis not present

## 2016-02-12 DIAGNOSIS — Z87438 Personal history of other diseases of male genital organs: Secondary | ICD-10-CM | POA: Diagnosis not present

## 2016-02-12 DIAGNOSIS — I2510 Atherosclerotic heart disease of native coronary artery without angina pectoris: Secondary | ICD-10-CM | POA: Insufficient documentation

## 2016-02-12 DIAGNOSIS — Z7901 Long term (current) use of anticoagulants: Secondary | ICD-10-CM | POA: Insufficient documentation

## 2016-02-12 DIAGNOSIS — E119 Type 2 diabetes mellitus without complications: Secondary | ICD-10-CM | POA: Diagnosis not present

## 2016-02-12 MED ORDER — HYDROCODONE-ACETAMINOPHEN 5-325 MG PO TABS
1.0000 | ORAL_TABLET | Freq: Once | ORAL | Status: AC
  Administered 2016-02-12: 1 via ORAL
  Filled 2016-02-12: qty 1

## 2016-02-12 MED ORDER — HYDROCODONE-ACETAMINOPHEN 5-325 MG PO TABS: 1.0000 | ORAL_TABLET | ORAL | Status: DC | PRN

## 2016-02-12 NOTE — ED Provider Notes (Signed)
CSN: [id number]     Arrival date & time 02/12/16  3419 History  By signing my name below, I, Irene Pap, attest that this documentation has been prepared under the direction and in the presence of Elnora Morrison, MD. Electronically Signed: Irene Pap, ED Scribe. 02/12/2016. 12:08 PM.     Chief Complaint  Patient presents with  . Shoulder Pain   The history is provided by the patient. No language interpreter was used.  HPI Comments: George Pollard is a 82 y.o. Male with a hx of CAD, HTN, A-Fib, DM, systolic CHF, BPH and PVD who presents to the Emergency Department complaining of waxing and waning, sharp, posterior right shoulder pain onset 4 months ago. He reports worsening pain with deep breathing, being at rest, and states that it runs down his entire arm. He reports that he has been having pain since having a pneumonia and flu vaccination in the arm. He states that there is a hard "knot" in the arm with redness and warmth on the area. He has seen his PCP and has been placed on antibiotics to no relief. He states that his PCP said that it was arthritis with shoulder x-rays and injections in his joint to no relief. He has also used Bengay and Tylenol to mild relief, but the pain will return after 2 hours. Pt takes Warfarin and aspirin daily. He denies hx of cancer and is a former smoker. He denies fever or chills.   Past Medical History  Diagnosis Date  . CAD (coronary artery disease)     a. CABG 1997. b. NSTEMI (troponin >20) s/p PTCA/DES to LCx 10/21/12, overlapping DES to mid & distal RCA 10/26/12.  . Atrial fibrillation ([place])   . Hypertension   . BPH (benign prostatic hyperplasia)   . PVD (peripheral vascular disease) ([place])     a. AAA repair 2001.  Marland Kitchen Systolic CHF ([place])     a. 10/2012: acute on chronic systolic CHF / pulmonary edema secondary to severe mixed cardiomyopathy.  . Diabetes mellitus ([place])     a. Noted 10/2012 (prior hx of DM resolved with weight loss).   Past Surgical  History  Procedure Laterality Date  . Coronary artery bypass graft      1997  . Abdominal aortic aneurysm repair      08/26/2000  . Cataract extraction    . Tonsillectomy    . Left heart catheterization with coronary/graft angiogram N/A 10/21/2012    Procedure: LEFT HEART CATHETERIZATION WITH Beatrix Fetters;  Surgeon: Burnell Blanks, MD;  Location: [facility] CATH LAB;  Service: Cardiovascular;  Laterality: N/A;  . Percutaneous coronary stent intervention (pci-s) N/A 10/26/2012    Procedure: PERCUTANEOUS CORONARY STENT INTERVENTION (PCI-S);  Surgeon: Sherren Mocha, MD;  Location: [facility] CATH LAB;  Service: Cardiovascular;  Laterality: N/A;   Family History  Problem Relation Age of Onset  . Valvular heart disease Daughter     MVP repair   Social History  Substance Use Topics  . Smoking status: Former Smoker -- 20 years  . Smokeless tobacco: None     Comment: Quit 1997  . Alcohol Use: No    Review of Systems  Constitutional: Negative for fever.  Musculoskeletal: Positive for arthralgias.  All other systems reviewed and are negative.   Allergies  Review of patient's allergies indicates no known allergies.  Home Medications   Prior to Admission medications   Medication Sig Start Date End Date Taking? Authorizing Provider  atorvastatin (LIPITOR) 80 MG tablet take  1 tablet by mouth once daily 12/18/12   Jolaine Artist, MD  digoxin (LANOXIN) 0.25 MG tablet Take 1 tablet (0.25 mg total) by mouth daily. 10/29/12   Dayna N Dunn, PA-C  furosemide (LASIX) 40 MG tablet Take 1 tablet (40 mg total) by mouth 2 (two) times daily. 10/29/12   Dayna N Dunn, PA-C  lisinopril (PRINIVIL,ZESTRIL) 2.5 MG tablet take 1 tablet by mouth once daily 12/18/12   Jolaine Artist, MD  metoprolol succinate (TOPROL-XL) 100 MG 24 hr tablet Take 1 tablet (100 mg total) by mouth 2 (two) times daily. 10/29/12   Dayna N Dunn, PA-C  nitroGLYCERIN (NITROSTAT) 0.4 MG SL tablet Place 1 tablet (0.4 mg total)  under the tongue every 5 (five) minutes as needed for chest pain (up to 3 doses). 10/29/12   Dayna N Dunn, PA-C  potassium chloride SA (K-DUR,KLOR-CON) 20 MEQ tablet Take 1 tablet (20 mEq total) by mouth daily. 10/29/12   Dayna N Dunn, PA-C  Ticagrelor (BRILINTA) 90 MG TABS tablet Take 1 tablet (90 mg total) by mouth 2 (two) times daily. 10/29/12   Dayna N Dunn, PA-C  warfarin (COUMADIN) 10 MG tablet Take 1 tablet (10 mg total) by mouth as directed. You will be given 12.'5mg'$  to take tonight (10/29/12). Take one '10mg'$  tablet by mouth Friday (11/15), Saturday (11/16), and Sunday (11/17). Please have your Coumadin level ("INR") checked on Monday 11/02/12. 10/29/12   Dayna N Dunn, PA-C   BP 139/83 mmHg  Pulse 68  Temp(Src) 97.6 F (36.4 C) (Oral)  Resp 16  Ht '5\' 10"'$  (1.778 m)  Wt 218 lb (98.884 kg)  BMI 31.28 kg/m2  SpO2 100% Physical Exam  Constitutional: He is oriented to person, place, and time. He appears well-developed and well-nourished.  HENT:  Head: Normocephalic and atraumatic.  Eyes: EOM are normal. Pupils are equal, round, and reactive to light.  Neck: Normal range of motion. Neck supple.  Cardiovascular: Normal rate, regular rhythm and normal heart sounds.  Exam reveals no gallop and no friction rub.   No murmur heard. Pulmonary/Chest: Effort normal and breath sounds normal. He has no wheezes.  Abdominal: Soft. There is no tenderness.  Musculoskeletal: Normal range of motion.  Focal pain to posterior lateral aspect of right shoulder; no sign of infection, induration or warmth; good ABduction, flexion and extension of the right arm; good pulses  Neurological: He is alert and oriented to person, place, and time.  Skin: Skin is warm and dry.  Psychiatric: He has a normal mood and affect. His behavior is normal.  Nursing note and vitals reviewed.   ED Course  Procedures (including critical care time) DIAGNOSTIC STUDIES: Oxygen Saturation is 100% on RA, normal by my  interpretation.    COORDINATION OF CARE: 11:16 AM-Discussed treatment plan which includes x-ray with pt at bedside and pt agreed to plan.    Labs Review Labs Reviewed - No data to display  Imaging Review Dg Chest 2 View  02/12/2016  CLINICAL DATA:  Coronary disease, hypertension, atrial fibrillation, diabetes mellitus, CHF, waxing and weaning sharp posterior RIGHT chest and shoulder pain beginning 4 months ago worsened with deep breathing, pain runs down arm EXAM: CHEST  2 VIEW COMPARISON:  12/20/2015 FINDINGS: LEFT subclavian transvenous AICD lead projects at RIGHT ventricle. Enlargement of cardiac silhouette post CABG and coronary stenting. Calcified tortuous thoracic aorta. Pulmonary vascularity normal. Emphysematous and bronchitic changes consistent with COPD. Bibasilar atelectasis. No acute infiltrate, pleural effusion or pneumothorax. Bones demineralized. IMPRESSION: COPD  changes with bibasilar atelectasis. Enlargement of cardiac silhouette post CABG, coronary stenting and AICD placement. Electronically Signed   By: Lavonia Dana M.D.   On: 02/12/2016 12:02   I have personally reviewed and evaluated these images and lab results as part of my medical decision-making.   EKG Interpretation None      MDM   Final diagnoses:  Right shoulder pain   Patient presents with recurrent right shoulder pain since getting a flu shot. Reproducible pain on exam no sign of infection. No chest pain or shortness of breath, no abdominal pain or tenderness. Chest x-ray no acute findings. Patient stable for outpatient follow-up with orthopedics. Patient has mild tenderness with empty can test.  Results and differential diagnosis were discussed with the patient/parent/guardian. Xrays were independently reviewed by myself.  Close follow up outpatient was discussed, comfortable with the plan.   Medications  HYDROcodone-acetaminophen (NORCO/VICODIN) 5-325 MG per tablet 1 tablet (1 tablet Oral Given 02/12/16  1124)    Filed Vitals:   02/12/16 0953 02/12/16 1208  BP: 139/83 123/81  Pulse: 68 84  Temp: 97.6 F (36.4 C)   TempSrc: Oral   Resp: 16 14  Height: '5\' 10"'$  (1.778 m)   Weight: 218 lb (98.884 kg)   SpO2: 100% 97%    Final diagnoses:  Right shoulder pain      Elnora Morrison, MD 02/12/16 1219

## 2016-02-12 NOTE — Discharge Instructions (Signed)
Take tylenol every 4 hours as needed and if over 6 mo of age take motrin (ibuprofen) every 6 hours as needed for fever or pain. Return for any changes, weird rashes, neck stiffness, change in behavior, new or worsening concerns.  Follow up with your physician as directed. Thank you Filed Vitals:   02/12/16 0953 02/12/16 1208  BP: 139/83 123/81  Pulse: 68 84  Temp: 97.6 F (36.4 C)   TempSrc: Oral   Resp: 16 14  Height: '5\' 10"'$  (1.778 m)   Weight: 218 lb (98.884 kg)   SpO2: 100% 97%   For severe pain take norco or vicodin however realize they have the potential for addiction and it can make you sleepy and has tylenol in it.  No operating machinery while taking.

## 2016-02-12 NOTE — ED Notes (Signed)
Pt states he has been having right shoulder pain for around 3 months. He got a flu and pneumonia shot in November and states he had a large knot pop up that was red and tender after shot. Since then patient has had pain in that arm and shoulder.

## 2016-02-14 DIAGNOSIS — Z9289 Personal history of other medical treatment: Secondary | ICD-10-CM

## 2016-02-14 HISTORY — DX: Personal history of other medical treatment: Z92.89

## 2016-02-27 ENCOUNTER — Ambulatory Visit (INDEPENDENT_AMBULATORY_CARE_PROVIDER_SITE_OTHER): Payer: Medicare Other | Admitting: Orthopedic Surgery

## 2016-02-27 ENCOUNTER — Ambulatory Visit (INDEPENDENT_AMBULATORY_CARE_PROVIDER_SITE_OTHER): Payer: Medicare Other

## 2016-02-27 VITALS — BP 147/92 | HR 88 | Ht 70.0 in | Wt 221.2 lb

## 2016-02-27 DIAGNOSIS — M25511 Pain in right shoulder: Secondary | ICD-10-CM

## 2016-02-27 NOTE — Patient Instructions (Signed)

## 2016-02-27 NOTE — Progress Notes (Signed)
Patient ID: George Pollard, male   DOB: Mar 08, 1933, 82 y.o.   MRN: [id number]  Chief Complaint  Patient presents with  . Shoulder Pain    Right shoulder pain    HPI George Pollard is a 82 y.o. male. Presents for evaluation of right shoulder pain for 3 months  The patient started having shoulder pain 3 months ago after he received a shoe a flu shot and a pneumonia vaccine complains of throbbing pain right shoulder right arm with point of maximal tenderness in the musculature of the infraspinatus. Pain is 10 out of 10 relieved by BenGay hydrocodone. He did get one subacromial injection from Dr. Brigitte Pulse no improvement  He listed following review of systems  Review of Systems Review of Systems  All other systems reviewed and are negative.   Past Medical History  Diagnosis Date  . CAD (coronary artery disease)     a. CABG 1997. b. NSTEMI (troponin >20) s/p PTCA/DES to LCx 10/21/12, overlapping DES to mid & distal RCA 10/26/12.  . Atrial fibrillation ([place])   . Hypertension   . BPH (benign prostatic hyperplasia)   . PVD (peripheral vascular disease) ([place])     a. AAA repair 2001.  Marland Kitchen Systolic CHF ([place])     a. 10/2012: acute on chronic systolic CHF / pulmonary edema secondary to severe mixed cardiomyopathy.  . Diabetes mellitus ([place])     a. Noted 10/2012 (prior hx of DM resolved with weight loss).    Past Surgical History  Procedure Laterality Date  . Coronary artery bypass graft      1997  . Abdominal aortic aneurysm repair      08/26/2000  . Cataract extraction    . Tonsillectomy    . Left heart catheterization with coronary/graft angiogram N/A 10/21/2012    Procedure: LEFT HEART CATHETERIZATION WITH Beatrix Fetters;  Surgeon: Burnell Blanks, MD;  Location: [facility] CATH LAB;  Service: Cardiovascular;  Laterality: N/A;  . Percutaneous coronary stent intervention (pci-s) N/A 10/26/2012    Procedure: PERCUTANEOUS CORONARY STENT INTERVENTION (PCI-S);  Surgeon: Sherren Mocha, MD;  Location: [facility] CATH LAB;  Service: Cardiovascular;  Laterality: N/A;    Family History  Problem Relation Age of Onset  . Valvular heart disease Daughter     MVP repair    Social History Social History  Substance Use Topics  . Smoking status: Former Smoker -- 20 years  . Smokeless tobacco: Not on file     Comment: Quit 1997  . Alcohol Use: No    No Known Allergies  Current Outpatient Prescriptions  Medication Sig Dispense Refill  . aspirin EC 81 MG tablet Take 81 mg by mouth daily.    Marland Kitchen atorvastatin (LIPITOR) 80 MG tablet take 1 tablet by mouth once daily 30 tablet 2  . furosemide (LASIX) 40 MG tablet Take 1 tablet (40 mg total) by mouth 2 (two) times daily. 60 tablet 2  . HYDROcodone-acetaminophen (NORCO) 5-325 MG tablet Take 1 tablet by mouth every 4 (four) hours as needed. 10 tablet 0  . lisinopril (PRINIVIL,ZESTRIL) 2.5 MG tablet take 1 tablet by mouth once daily 30 tablet 2  . metFORMIN (GLUCOPHAGE) 500 MG tablet Take 500 mg by mouth daily.   0  . metoprolol succinate (TOPROL-XL) 100 MG 24 hr tablet Take 1 tablet (100 mg total) by mouth 2 (two) times daily. 60 tablet 2  . nitroGLYCERIN (NITROSTAT) 0.4 MG SL tablet Place 1 tablet (0.4 mg total) under the tongue every 5 (five)  minutes as needed for chest pain (up to 3 doses). 25 tablet 2  . potassium chloride SA (K-DUR,KLOR-CON) 20 MEQ tablet Take 1 tablet (20 mEq total) by mouth daily. 30 tablet 2  . warfarin (COUMADIN) 10 MG tablet Take 1 tablet (10 mg total) by mouth as directed. You will be given 12.'5mg'$  to take tonight (10/29/12). Take one '10mg'$  tablet by mouth Friday (11/15), Saturday (11/16), and Sunday (11/17). Please have your Coumadin level ("INR") checked on Monday 11/02/12.     No current facility-administered medications for this visit.       Physical Exam Physical Exam  Constitutional: He is oriented to person, place, and time. He appears well-developed and well-nourished. No distress.   Cardiovascular: Intact distal pulses.   Musculoskeletal:  Normal ambulation  Neurological: He is alert and oriented to person, place, and time. He has normal reflexes. He displays normal reflexes. He exhibits normal muscle tone.  Skin: Skin is warm and dry. He is not diaphoretic.  Psychiatric: He has a normal mood and affect. His behavior is normal. Judgment and thought content normal.  Right Shoulder Exam   Tenderness  Right shoulder tenderness location: Infraspinatus near its insertion to the humerus.  Range of Motion  Active Abduction: 110  Passive Abduction: 130  Extension: 30  Forward Flexion: 130  External Rotation: 40  Internal Rotation 0 degrees: T10   Muscle Strength  The patient has normal right shoulder strength.  Tests  Apprehension: negative Cross Arm: negative Drop Arm: negative Hawkin's test: positive Impingement: positive Sulcus: absent  Other  Erythema: absent Sensation: normal Pulse: present   Left Shoulder Exam  Left shoulder exam is normal.  Tenderness  The patient is experiencing no tenderness.     Range of Motion  The patient has normal left shoulder ROM.  Muscle Strength  The patient has normal left shoulder strength.  Tests  Apprehension: negative Hawkin's test: negative Impingement: negative  Other  Erythema: absent Sensation: normal Pulse: present      Blood pressure 147/92, pulse 88, height '5\' 10"'$  (1.778 m), weight 221 lb 3.2 oz (100.336 kg).    Data Reviewed Imaging of the right shoulder are independently reviewed and I interpreted these as chronic rotator cuff disease evidenced by greater tuberosity sclerosis and cyst formation and proximal migration of the humerus and break in [place]ton's line  Assessment  I think most of this came from his injection however he has some mild impingement signs we gave him a subacromial injection   Plan   Procedure note the subacromial injection shoulder RIGHT  Verbal consent  was obtained to inject the  RIGHT   Shoulder  Timeout was completed to confirm the injection site is a subacromial space of the  RIGHT  shoulder   Medication used Depo-Medrol 40 mg and lidocaine 1% 3 cc  Anesthesia was provided by ethyl chloride  The injection was performed in the RIGHT  posterior subacromial space. After pinning the skin with alcohol and anesthetized the skin with ethyl chloride the subacromial space was injected using a 20-gauge needle. There were no complications  Sterile dressing was applied.  We'll see him on an as-needed basis no significant acute pathology

## 2016-02-28 ENCOUNTER — Emergency Department (HOSPITAL_COMMUNITY): Payer: Medicare Other

## 2016-02-28 ENCOUNTER — Inpatient Hospital Stay (HOSPITAL_COMMUNITY): Payer: Medicare Other

## 2016-02-28 ENCOUNTER — Inpatient Hospital Stay (HOSPITAL_COMMUNITY)
Admission: EM | Admit: 2016-02-28 | Discharge: 2016-03-01 | DRG: 309 | Disposition: A | Discharge status: Home / Self Care | Payer: Medicare Other | Attending: Cardiology | Admitting: Cardiology

## 2016-02-28 ENCOUNTER — Encounter (HOSPITAL_COMMUNITY): Payer: Self-pay

## 2016-02-28 DIAGNOSIS — Z7982 Long term (current) use of aspirin: Secondary | ICD-10-CM

## 2016-02-28 DIAGNOSIS — Z0389 Encounter for observation for other suspected diseases and conditions ruled out: Secondary | ICD-10-CM | POA: Diagnosis not present

## 2016-02-28 DIAGNOSIS — Z955 Presence of coronary angioplasty implant and graft: Secondary | ICD-10-CM | POA: Diagnosis not present

## 2016-02-28 DIAGNOSIS — I5022 Chronic systolic (congestive) heart failure: Secondary | ICD-10-CM | POA: Diagnosis present

## 2016-02-28 DIAGNOSIS — Z87891 Personal history of nicotine dependence: Secondary | ICD-10-CM | POA: Diagnosis not present

## 2016-02-28 DIAGNOSIS — N400 Enlarged prostate without lower urinary tract symptoms: Secondary | ICD-10-CM | POA: Diagnosis present

## 2016-02-28 DIAGNOSIS — I472 Ventricular tachycardia, unspecified: Secondary | ICD-10-CM

## 2016-02-28 DIAGNOSIS — E119 Type 2 diabetes mellitus without complications: Secondary | ICD-10-CM | POA: Diagnosis present

## 2016-02-28 DIAGNOSIS — M25511 Pain in right shoulder: Secondary | ICD-10-CM | POA: Diagnosis present

## 2016-02-28 DIAGNOSIS — I110 Hypertensive heart disease with heart failure: Secondary | ICD-10-CM | POA: Diagnosis present

## 2016-02-28 DIAGNOSIS — I255 Ischemic cardiomyopathy: Secondary | ICD-10-CM | POA: Diagnosis present

## 2016-02-28 DIAGNOSIS — Z95810 Presence of automatic (implantable) cardiac defibrillator: Secondary | ICD-10-CM

## 2016-02-28 DIAGNOSIS — I252 Old myocardial infarction: Secondary | ICD-10-CM | POA: Diagnosis not present

## 2016-02-28 DIAGNOSIS — Z7984 Long term (current) use of oral hypoglycemic drugs: Secondary | ICD-10-CM | POA: Diagnosis not present

## 2016-02-28 DIAGNOSIS — E785 Hyperlipidemia, unspecified: Secondary | ICD-10-CM | POA: Diagnosis present

## 2016-02-28 DIAGNOSIS — I482 Chronic atrial fibrillation, unspecified: Secondary | ICD-10-CM

## 2016-02-28 DIAGNOSIS — Z7901 Long term (current) use of anticoagulants: Secondary | ICD-10-CM | POA: Diagnosis not present

## 2016-02-28 DIAGNOSIS — Z951 Presence of aortocoronary bypass graft: Secondary | ICD-10-CM | POA: Diagnosis not present

## 2016-02-28 DIAGNOSIS — R609 Edema, unspecified: Secondary | ICD-10-CM

## 2016-02-28 DIAGNOSIS — I25810 Atherosclerosis of coronary artery bypass graft(s) without angina pectoris: Secondary | ICD-10-CM | POA: Diagnosis present

## 2016-02-28 DIAGNOSIS — I739 Peripheral vascular disease, unspecified: Secondary | ICD-10-CM | POA: Diagnosis present

## 2016-02-28 DIAGNOSIS — I481 Persistent atrial fibrillation: Secondary | ICD-10-CM | POA: Diagnosis not present

## 2016-02-28 DIAGNOSIS — I4891 Unspecified atrial fibrillation: Secondary | ICD-10-CM

## 2016-02-28 DIAGNOSIS — I25700 Atherosclerosis of coronary artery bypass graft(s), unspecified, with unstable angina pectoris: Secondary | ICD-10-CM | POA: Diagnosis not present

## 2016-02-28 DIAGNOSIS — Z79899 Other long term (current) drug therapy: Secondary | ICD-10-CM

## 2016-02-28 DIAGNOSIS — Z4502 Encounter for adjustment and management of automatic implantable cardiac defibrillator: Secondary | ICD-10-CM

## 2016-02-28 HISTORY — DX: Ventricular tachycardia: I47.2

## 2016-02-28 HISTORY — DX: Ventricular tachycardia, unspecified: I47.20

## 2016-02-28 LAB — TSH: TSH: 2.278 u[IU]/mL (ref 0.350–4.500)

## 2016-02-28 LAB — PROTIME-INR
INR: 2.64 — ABNORMAL HIGH (ref 0.00–1.49)
PROTHROMBIN TIME: 27.8 s — AB (ref 11.6–15.2)

## 2016-02-28 LAB — CBC WITH DIFFERENTIAL/PLATELET
BASOS ABS: 0 10*3/uL (ref 0.0–0.1)
Basophils Relative: 0 %
EOS PCT: 0 %
Eosinophils Absolute: 0 10*3/uL (ref 0.0–0.7)
HCT: 35.9 % — ABNORMAL LOW (ref 39.0–52.0)
Hemoglobin: 10.9 g/dL — ABNORMAL LOW (ref 13.0–17.0)
LYMPHS PCT: 8 %
Lymphs Abs: 0.6 10*3/uL — ABNORMAL LOW (ref 0.7–4.0)
MCH: 26.6 pg (ref 26.0–34.0)
MCHC: 30.4 g/dL (ref 30.0–36.0)
MCV: 87.6 fL (ref 78.0–100.0)
Monocytes Absolute: 0.7 10*3/uL (ref 0.1–1.0)
Monocytes Relative: 9 %
Neutro Abs: 6.1 10*3/uL (ref 1.7–7.7)
Neutrophils Relative %: 83 %
PLATELETS: 206 10*3/uL (ref 150–400)
RBC: 4.1 MIL/uL — AB (ref 4.22–5.81)
RDW: 17.9 % — ABNORMAL HIGH (ref 11.5–15.5)
WBC: 7.4 10*3/uL (ref 4.0–10.5)

## 2016-02-28 LAB — MAGNESIUM: MAGNESIUM: 2.2 mg/dL (ref 1.7–2.4)

## 2016-02-28 LAB — I-STAT CHEM 8, ED
BUN: 27 mg/dL — ABNORMAL HIGH (ref 6–20)
CALCIUM ION: 1.15 mmol/L (ref 1.13–1.30)
Chloride: 104 mmol/L (ref 101–111)
Creatinine, Ser: 1 mg/dL (ref 0.61–1.24)
GLUCOSE: 214 mg/dL — AB (ref 65–99)
HCT: 48 % (ref 39.0–52.0)
HEMOGLOBIN: 16.3 g/dL (ref 13.0–17.0)
Potassium: 4 mmol/L (ref 3.5–5.1)
Sodium: 140 mmol/L (ref 135–145)
TCO2: 23 mmol/L (ref 0–100)

## 2016-02-28 LAB — BASIC METABOLIC PANEL
ANION GAP: 11 (ref 5–15)
BUN: 26 mg/dL — ABNORMAL HIGH (ref 6–20)
CO2: 23 mmol/L (ref 22–32)
Calcium: 9.3 mg/dL (ref 8.9–10.3)
Chloride: 106 mmol/L (ref 101–111)
Creatinine, Ser: 1.15 mg/dL (ref 0.61–1.24)
GFR, EST NON AFRICAN AMERICAN: 58 mL/min — AB (ref >60)
GLUCOSE: 225 mg/dL — AB (ref 65–99)
POTASSIUM: 4.2 mmol/L (ref 3.5–5.1)
SODIUM: 140 mmol/L (ref 135–145)

## 2016-02-28 LAB — HEPATIC FUNCTION PANEL
ALT: 25 U/L (ref 17–63)
AST: 28 U/L (ref 15–41)
Albumin: 3.2 g/dL — ABNORMAL LOW (ref 3.5–5.0)
Alkaline Phosphatase: 68 U/L (ref 38–126)
BILIRUBIN DIRECT: 0.1 mg/dL (ref 0.1–0.5)
BILIRUBIN INDIRECT: 0.6 mg/dL (ref 0.3–0.9)
BILIRUBIN TOTAL: 0.7 mg/dL (ref 0.3–1.2)
Total Protein: 6.8 g/dL (ref 6.5–8.1)

## 2016-02-28 LAB — LIPID PANEL
Cholesterol: 96 mg/dL (ref 0–200)
HDL: 34 mg/dL — AB (ref >40)
LDL CALC: 49 mg/dL (ref 0–99)
TRIGLYCERIDES: 65 mg/dL (ref <150)
Total CHOL/HDL Ratio: 2.8 RATIO
VLDL: 13 mg/dL (ref 0–40)

## 2016-02-28 LAB — BRAIN NATRIURETIC PEPTIDE: B NATRIURETIC PEPTIDE 5: 242.2 pg/mL — AB (ref 0.0–100.0)

## 2016-02-28 LAB — TROPONIN I

## 2016-02-28 LAB — CBG MONITORING, ED
GLUCOSE-CAPILLARY: 138 mg/dL — AB (ref 65–99)
GLUCOSE-CAPILLARY: 127 mg/dL — AB (ref 65–99)

## 2016-02-28 LAB — GLUCOSE, CAPILLARY: GLUCOSE-CAPILLARY: 150 mg/dL — AB (ref 65–99)

## 2016-02-28 LAB — I-STAT TROPONIN, ED: TROPONIN I, POC: 0 ng/mL (ref 0.00–0.08)

## 2016-02-28 MED ORDER — WARFARIN SODIUM 10 MG PO TABS: 10.0000 mg | ORAL_TABLET | ORAL | Status: DC

## 2016-02-28 MED ORDER — AMIODARONE HCL IN DEXTROSE 360-4.14 MG/200ML-% IV SOLN: 60.0000 mg/h | Freq: Once | INTRAVENOUS | Status: DC

## 2016-02-28 MED ORDER — ONDANSETRON HCL 4 MG/2ML IJ SOLN: 4.0000 mg | Freq: Four times a day (QID) | INTRAMUSCULAR | Status: DC | PRN

## 2016-02-28 MED ORDER — AMIODARONE HCL IN DEXTROSE 360-4.14 MG/200ML-% IV SOLN
60.0000 mg/h | INTRAVENOUS | Status: AC
  Administered 2016-02-28: 60 mg/h via INTRAVENOUS
  Filled 2016-02-28 (×3): qty 200

## 2016-02-28 MED ORDER — FUROSEMIDE 20 MG PO TABS: 40.0000 mg | ORAL_TABLET | Freq: Two times a day (BID) | ORAL | Status: DC

## 2016-02-28 MED ORDER — ASPIRIN 81 MG PO CHEW
324.0000 mg | CHEWABLE_TABLET | Freq: Once | ORAL | Status: AC
  Administered 2016-02-28: 324 mg via ORAL
  Filled 2016-02-28: qty 4

## 2016-02-28 MED ORDER — FUROSEMIDE 10 MG/ML IJ SOLN
40.0000 mg | Freq: Once | INTRAMUSCULAR | Status: AC
  Administered 2016-02-28: 40 mg via INTRAVENOUS
  Filled 2016-02-28: qty 4

## 2016-02-28 MED ORDER — AMIODARONE LOAD VIA INFUSION
150.0000 mg | Freq: Once | INTRAVENOUS | Status: AC
  Administered 2016-02-28: 150 mg via INTRAVENOUS
  Filled 2016-02-28: qty 83.34

## 2016-02-28 MED ORDER — AMIODARONE HCL IN DEXTROSE 360-4.14 MG/200ML-% IV SOLN
30.0000 mg/h | INTRAVENOUS | Status: DC
  Administered 2016-02-28 – 2016-03-01 (×6): 30 mg/h via INTRAVENOUS
  Filled 2016-02-28 (×5): qty 200

## 2016-02-28 MED ORDER — METOPROLOL SUCCINATE ER 25 MG PO TB24: 100.0000 mg | ORAL_TABLET | Freq: Two times a day (BID) | ORAL | Status: DC

## 2016-02-28 MED ORDER — INSULIN ASPART 100 UNIT/ML ~~LOC~~ SOLN
0.0000 [IU] | Freq: Three times a day (TID) | SUBCUTANEOUS | Status: DC
  Administered 2016-02-28 – 2016-02-29 (×5): 1 [IU] via SUBCUTANEOUS
  Administered 2016-03-01: 3 [IU] via SUBCUTANEOUS
  Administered 2016-03-01: 1 [IU] via SUBCUTANEOUS
  Filled 2016-02-28: qty 1

## 2016-02-28 MED ORDER — LISINOPRIL 2.5 MG PO TABS
2.5000 mg | ORAL_TABLET | Freq: Every day | ORAL | Status: DC
  Administered 2016-02-28 – 2016-03-01 (×3): 2.5 mg via ORAL
  Filled 2016-02-28 (×3): qty 1

## 2016-02-28 MED ORDER — ATORVASTATIN CALCIUM 80 MG PO TABS
80.0000 mg | ORAL_TABLET | Freq: Every day | ORAL | Status: DC
  Administered 2016-02-28 – 2016-03-01 (×3): 80 mg via ORAL
  Filled 2016-02-28 (×2): qty 1
  Filled 2016-02-28: qty 8

## 2016-02-28 MED ORDER — WARFARIN - PHARMACIST DOSING INPATIENT
Freq: Every day | Status: DC
Start: 2016-02-28 — End: 2016-02-29
  Administered 2016-02-28: 19:00:00

## 2016-02-28 MED ORDER — METOPROLOL TARTRATE 50 MG PO TABS
75.0000 mg | ORAL_TABLET | Freq: Two times a day (BID) | ORAL | Status: DC
  Administered 2016-02-28 – 2016-03-01 (×5): 75 mg via ORAL
  Filled 2016-02-28 (×2): qty 1
  Filled 2016-02-28: qty 3
  Filled 2016-02-28 (×2): qty 1

## 2016-02-28 MED ORDER — HYDROCODONE-ACETAMINOPHEN 5-325 MG PO TABS
1.0000 | ORAL_TABLET | ORAL | Status: DC | PRN
  Administered 2016-02-29 – 2016-03-01 (×4): 1 via ORAL
  Filled 2016-02-28 (×4): qty 1

## 2016-02-28 MED ORDER — WARFARIN SODIUM 10 MG PO TABS
10.0000 mg | ORAL_TABLET | Freq: Once | ORAL | Status: AC
  Administered 2016-02-28: 10 mg via ORAL
  Filled 2016-02-28: qty 1

## 2016-02-28 MED ORDER — NITROGLYCERIN 0.4 MG SL SUBL: 0.4000 mg | SUBLINGUAL_TABLET | SUBLINGUAL | Status: DC | PRN

## 2016-02-28 MED ORDER — ASPIRIN EC 81 MG PO TBEC
81.0000 mg | DELAYED_RELEASE_TABLET | Freq: Every day | ORAL | Status: DC
  Administered 2016-02-28 – 2016-03-01 (×3): 81 mg via ORAL
  Filled 2016-02-28 (×4): qty 1

## 2016-02-28 MED ORDER — ACETAMINOPHEN 325 MG PO TABS
650.0000 mg | ORAL_TABLET | ORAL | Status: DC | PRN
  Administered 2016-02-28: 650 mg via ORAL
  Filled 2016-02-28: qty 2

## 2016-02-28 NOTE — ED Provider Notes (Signed)
By signing my name below, I, Forrestine Him, attest that this documentation has been prepared under the direction and in the presence of [place], DO.  Electronically Signed: Forrestine Him, ED Scribe. 02/28/2016. 1:02 AM.   TIME SEEN: 12:53 AM   CHIEF COMPLAINT:  Chief Complaint  Patient presents with  . Pacemaker Problem     HPI:  HPI Comments: George Pollard brought in by EMS is a 82 y.o. male with a PMHx of CAD Status post CABG, A-Fib (? Not sure if on anti-coagulation), HTN, PVD, CHF, and DM who presents to the Emergency Department here for an ICD [facility]) issue this evening. Pt states his defibrillator fired 4 times tonight while sitting on his couch watching TV. Device was placed in January of 2014 and admits device has fired in the past as it was "set too low". No chest pain reported currently or when defibrillator fired. States that he needed if your bladder fired because it fully extended kicked him in the chest 4 times in a row. Pt reports ongoing, worsening shortness of breath which is exacerbated with exertion x few weeks. His wife thinks that this is secondary to him being more sedentary because of chronic right shoulder pain that he has been followed by Dr. Braulio Conte, orthopedics, for. However, pt states shortness of breath did not worsen when defibrillator fired. No recent fever, chills, nausea, vomiting, dizziness, or diaphoresis. States he has noticed some increased swelling in both of his legs for the past several weeks. PSHx includes CABG in 1997, AAA repair 08/26/2010, and L heart catheterization with coronary/graft angiogram 10/21/2012.  States his cardiologist is with [facility] in [place].  Past Surgical History  Procedure Laterality Date  . Coronary artery bypass graft      1997  . Abdominal aortic aneurysm repair      08/26/2000  . Cataract extraction    . Tonsillectomy    . Left heart catheterization with coronary/graft angiogram N/A 10/21/2012    Procedure:  LEFT HEART CATHETERIZATION WITH Beatrix Fetters;  Surgeon: Burnell Blanks, MD;  Location[facility] CATH LAB;  Service: Cardiovascular;  Laterality: N/A;  . Percutaneous coronary stent intervention (pci-s) N/A 10/26/2012    Procedure: PERCUTANEOUS CORONARY STENT INTERVENTION (PCI-S);  Surgeon: Sherren Mocha, MD;  Location: [facility] CATH LAB;  Service: Cardiovascular;  Laterality: N/A;    PCP: Monico Blitz, MD    ROS: See HPI Constitutional: no fever  Eyes: no drainage  ENT: no runny nose   Cardiovascular:  no chest pain  Resp: Positive SOB  GI: no vomiting GU: no dysuria Integumentary: no rash  Allergy: no hives  Musculoskeletal: no leg swelling. Positive arthralgias   Neurological: no slurred speech ROS otherwise negative  PAST MEDICAL HISTORY/PAST SURGICAL HISTORY:  Past Medical History  Diagnosis Date  . CAD (coronary artery disease)     a. CABG 1997. b. NSTEMI (troponin >20) s/p PTCA/DES to LCx 10/21/12, overlapping DES to mid & distal RCA 10/26/12.  . Atrial fibrillation ([place])   . Hypertension   . BPH (benign prostatic hyperplasia)   . PVD (peripheral vascular disease) ([place])     a. AAA repair 2001.  Marland Kitchen Systolic CHF ([place])     a. 10/2012: acute on chronic systolic CHF / pulmonary edema secondary to severe mixed cardiomyopathy.  . Diabetes mellitus ([place])     a. Noted 10/2012 (prior hx of DM resolved with weight loss).    MEDICATIONS:  Prior to Admission medications   Medication Sig Start Date  End Date Taking? Authorizing Provider  aspirin EC 81 MG tablet Take 81 mg by mouth daily.    Historical Provider, MD  atorvastatin (LIPITOR) 80 MG tablet take 1 tablet by mouth once daily 12/18/12   Jolaine Artist, MD  furosemide (LASIX) 40 MG tablet Take 1 tablet (40 mg total) by mouth 2 (two) times daily. 10/29/12   Dayna N Dunn, PA-C  HYDROcodone-acetaminophen (NORCO) 5-325 MG tablet Take 1 tablet by mouth every 4 (four) hours as needed. 02/12/16   Elnora Morrison, MD  lisinopril  (PRINIVIL,ZESTRIL) 2.5 MG tablet take 1 tablet by mouth once daily 12/18/12   Jolaine Artist, MD  metFORMIN (GLUCOPHAGE) 500 MG tablet Take 500 mg by mouth daily.  01/22/16   Historical Provider, MD  metoprolol succinate (TOPROL-XL) 100 MG 24 hr tablet Take 1 tablet (100 mg total) by mouth 2 (two) times daily. 10/29/12   Dayna N Dunn, PA-C  nitroGLYCERIN (NITROSTAT) 0.4 MG SL tablet Place 1 tablet (0.4 mg total) under the tongue every 5 (five) minutes as needed for chest pain (up to 3 doses). 10/29/12   Dayna N Dunn, PA-C  potassium chloride SA (K-DUR,KLOR-CON) 20 MEQ tablet Take 1 tablet (20 mEq total) by mouth daily. 10/29/12   Dayna N Dunn, PA-C  warfarin (COUMADIN) 10 MG tablet Take 1 tablet (10 mg total) by mouth as directed. You will be given 12.'5mg'$  to take tonight (10/29/12). Take one '10mg'$  tablet by mouth Friday (11/15), Saturday (11/16), and Sunday (11/17). Please have your Coumadin level ("INR") checked on Monday 11/02/12. 10/29/12   Dayna N Dunn, PA-C    ALLERGIES:  No Known Allergies  SOCIAL HISTORY:  Social History  Substance Use Topics  . Smoking status: Former Smoker -- 20 years  . Smokeless tobacco: Not on file     Comment: Quit 1997  . Alcohol Use: No    FAMILY HISTORY: Family History  Problem Relation Age of Onset  . Valvular heart disease Daughter     MVP repair    EXAM: BP 115/82 mmHg  Pulse 94  Temp(Src) 99.1 F (37.3 C) (Oral)  Resp 16  SpO2 97% CONSTITUTIONAL: Alert and oriented and responds appropriately to questions. Elderly and chronically ill appearing HEAD: Normocephalic EYES: Conjunctivae clear, PERRL ENT: normal nose; no rhinorrhea; moist mucous membranes NECK: Supple, no meningismus, no LAD , No JVD CARD: Irregularly irregular; S1 and S2 appreciated; no murmurs, no clicks, no rubs, no gallops CHEST:  Old sternotomy scar noted to the chest wall, pacemaker in the left chest wall is nontender to palpation without erythema, warmth or swelling RESP:  Normal chest excursion without splinting or tachypnea; breath sounds clear and equal bilaterally; no wheezes, no rhonchi, no rales, no hypoxia or respiratory distress, speaking full sentences ABD/GI: Normal bowel sounds; non-distended; soft, non-tender, no rebound, no guarding, no peritoneal signs BACK:  The back appears normal and is non-tender to palpation, there is no CVA tenderness EXT: Normal ROM in all joints; non-tender to palpation; bilateral lower extremity pitting edema; normal capillary refill; no cyanosis, no calf tenderness or swelling    SKIN: Normal color for age and race; warm; no rash NEURO: Moves all extremities equally, sensation to light touch intact diffusely, cranial nerves II through XII intact PSYCH: The patient's mood and manner are appropriate. Grooming and personal hygiene are appropriate.  MEDICAL DECISION MAKING: Patient here with complaints of feeling his defibrillator fire tonight. Asymptomatic when this occurred. Asymptomatic currently. He does appear to be having runs of  unsustained ventricular tachycardia. We'll start amiodarone drip and give bolus. We'll give aspirin. We'll obtain labs, check electrolytes. Anticipate cardiology consult. Will interrogate his [facility] scientific defibrillator. He does appear slightly volume overloaded. We'll give IV Lasix.  ED PROGRESS: Labs show negative troponin, normal electrolytes. BNP is 242. Chest x-ray shows bibasilar airspace opacities consistent with mild edema. He is no longer having episodes of unsustained ventricular tachycardia but is having occasional PVCs. He is still a symptomatic. Will consult cardiology.   3:00 AM  D/w Dr. Margaretha Seeds with cardiology who will see the patient in consult. We are still awaiting [facility] scientific interrogation information. Patient will be admitted.  We are still awaiting report from [place] which cardiology will follow-up on determine if patient was in ventricular tachycardia or  another rhythm during these episodes when he was defibrillated.     EKG Interpretation  Date/Time:  Wednesday February 28 2016 00:12:32 EDT Ventricular Rate:  103 PR Interval:    QRS Duration: 145 QT Interval:  376 QTC Calculation: 492 R Axis:   -91 Text Interpretation:  Atrial fibrillation Right bundle branch block Frequent PVCs/Unsustained V tach Confirmed by WARD,  DO, KRISTEN [phone]) on 02/28/2016 12:51:11 AM        EKG Interpretation  Date/Time:  Wednesday February 28 2016 02:05:07 EDT Ventricular Rate:  78 PR Interval:    QRS Duration: 152 QT Interval:  457 QTC Calculation: 521 R Axis:   -90 Text Interpretation:  Atrial fibrillation Paired ventricular premature complexes Right bundle branch block Inferior infarct, old Confirmed by WARD,  DO, KRISTEN ([id number]) on 02/28/2016 2:15:27 AM       CRITICAL CARE Performed by: Nyra Jabs   Total critical care time: 45 minutes  Critical care time was exclusive of separately billable procedures and treating other patients.  Critical care was necessary to treat or prevent imminent or life-threatening deterioration.  Critical care was time spent personally by me on the following activities: development of treatment plan with patient and/or surrogate as well as nursing, discussions with consultants, evaluation of patient's response to treatment, examination of patient, obtaining history from patient or surrogate, ordering and performing treatments and interventions, ordering and review of laboratory studies, ordering and review of radiographic studies, pulse oximetry and re-evaluation of patient's condition.     I personally performed the services described in this documentation, which was scribed in my presence. The recorded information has been reviewed and is accurate.   [place], DO 02/28/16 [phone]

## 2016-02-28 NOTE — ED Notes (Signed)
Agricultural consultant to interrogate pacemaker

## 2016-02-28 NOTE — ED Notes (Signed)
Pt arrived via [facility] c/o pacemaker problem, pt states he was home sitting on the couch watching TV and his internal debibrillator fired 4x in minutes. Pt believes it is set to fire HR greater then 220.  Pt took 3 SL Nitro's at home but has had no chest pain and no SOB.  Hx: MI 2015, HTN, DM

## 2016-02-28 NOTE — Progress Notes (Signed)
ANTICOAGULATION CONSULT NOTE - Initial Consult  Pharmacy Consult for warfarin Indication: atrial fibrillation  No Known Allergies  Patient Measurements:    Vital Signs: BP: 136/88 mmHg (03/15 1258) Pulse Rate: 59 (03/15 1245)  Labs:  Recent Labs  02/28/16 0215 02/28/16 0305  HGB 10.9* 16.3  HCT 35.9* 48.0  PLT 206  --   LABPROT 27.8*  --   INR 2.64*  --   CREATININE 1.15 1.00  TROPONINI <0.03  --     Estimated Creatinine Clearance: 69.9 mL/min (by C-G formula based on Cr of 1).   Medical History: Past Medical History  Diagnosis Date  . CAD (coronary artery disease)     a. CABG 1997. b. NSTEMI (troponin >20) s/p PTCA/DES to LCx 10/21/12, overlapping DES to mid & distal RCA 10/26/12.  . Atrial fibrillation ([place])   . Hypertension   . BPH (benign prostatic hyperplasia)   . PVD (peripheral vascular disease) ([place])     a. AAA repair 2001.  Marland Kitchen Systolic CHF ([place])     a. 10/2012: acute on chronic systolic CHF / pulmonary edema secondary to severe mixed cardiomyopathy.  . Diabetes mellitus ([place])     a. Noted 10/2012 (prior hx of DM resolved with weight loss).    Assessment: 68 yom with afib on warfarin pta. Pharmacy consulted to continue inpatient. INR therapeutic on admit 2.64, CBC ok, no bleed documented.  PTA dose: '10mg'$  daily except '5mg'$  on Sun (last dose pta 3/14)  Goal of Therapy:  INR 2-3 Monitor platelets by anticoagulation protocol: Yes   Plan:  Warfarin '10mg'$  x1 dose tonight Daily INR Monitor s/sx bleeding  Elicia Lamp, PharmD, BCPS Clinical Pharmacist Pager [phone] 02/28/2016 1:10 PM

## 2016-02-28 NOTE — Consult Note (Signed)
ELECTROPHYSIOLOGY CONSULT NOTE    Patient ID: RYOTT George MRN: [id number], DOB/AGE: [id number] 80 y.o.  Admit date: 02/28/2016 Date of Consult: 02/28/2016   Primary Physician: Monico Blitz, MD Primary Cardiologist: Dr. Hamilton Capri  Reason for Consultation: ICD therapies  HPI: George Pollard is a 82 y.o. male with PMHxof CAD with CABG remotely, last PCI in 2013, AFib, HTN, AAA repair in 2001, HLD, and DM comes to the ED after his ICD fired.  Presenting labs show his K+ 4.0 and Mag 2.2, Trop I is <0.03 and POC 0.00, INR is therapeutic at 2.64  The patient reports that he was doing nothing of any significance, had changed clothes getting ready for bed, let the dog out and was sitting on the sofa when she was shocked 4 times.  He has no pre-therapy symptoms of any kind, he has been feeling well of late, no CP, palpitations, no dizziness, near syncope or syncope lately or with the shocks.  He remains symptom free.  He has not missed any medications, was to see his orthopedic MD that morning and did get a steroid injection for his shoulder, and taken his medicines late that day but did not miss any.   Past Medical History  Diagnosis Date  . CAD (coronary artery disease)     a. CABG 1997. b. NSTEMI (troponin >20) s/p PTCA/DES to LCx 10/21/12, overlapping DES to mid & distal RCA 10/26/12.  . Atrial fibrillation ([place])   . Hypertension   . BPH (benign prostatic hyperplasia)   . PVD (peripheral vascular disease) ([place])     a. AAA repair 2001.  Marland Kitchen Systolic CHF ([place])     a. 10/2012: acute on chronic systolic CHF / pulmonary edema secondary to severe mixed cardiomyopathy.  . Diabetes mellitus ([place])     a. Noted 10/2012 (prior hx of DM resolved with weight loss).     Surgical History:  Past Surgical History  Procedure Laterality Date  . Coronary artery bypass graft      1997  . Abdominal aortic aneurysm repair      08/26/2000  . Cataract extraction    . Tonsillectomy    . Left heart  catheterization with coronary/graft angiogram N/A 10/21/2012    Procedure: LEFT HEART CATHETERIZATION WITH Beatrix Fetters;  Surgeon: Burnell Blanks, MD;  Location: [facility] CATH LAB;  Service: Cardiovascular;  Laterality: N/A;  . Percutaneous coronary stent intervention (pci-s) N/A 10/26/2012    Procedure: PERCUTANEOUS CORONARY STENT INTERVENTION (PCI-S);  Surgeon: Sherren Mocha, MD;  Location: [facility] CATH LAB;  Service: Cardiovascular;  Laterality: N/A;      (Not in a hospital admission)  Inpatient Medications:  . aspirin EC  81 mg Oral Daily  . atorvastatin  80 mg Oral Daily  . insulin aspart  0-9 Units Subcutaneous TID WC  . lisinopril  2.5 mg Oral Daily  . metoprolol tartrate  75 mg Oral BID  . warfarin  10 mg Oral UD    Allergies: No Known Allergies  Social History   Social History  . Marital Status: Married    Spouse Name: N/A  . Number of Children: 4  . Years of Education: N/A   Occupational History  . Not on file.   Social History Main Topics  . Smoking status: Former Smoker -- 20 years  . Smokeless tobacco: Not on file     Comment: Quit 1997  . Alcohol Use: No  . Drug Use: No  . Sexual Activity: Not on file  Other Topics Concern  . Not on file   Social History Narrative   Lives in [place].  Lives with wife.       Family History  Problem Relation Age of Onset  . Valvular heart disease Daughter     MVP repair     Review of Systems: All other systems reviewed and are otherwise negative except as noted above.  Physical Exam: Filed Vitals:   02/28/16 0845 02/28/16 0900 02/28/16 0915 02/28/16 0919  BP: 103/62 116/66 126/84 126/84  Pulse: 71  63 74  Temp:      TempSrc:      Resp: '19 13 16   '$ SpO2: 97%  98%    GEN- The patient is obese, appears in NAD, alert and oriented x 3 today.   HEENT: normocephalic, atraumatic; sclera clear, conjunctiva pink; hearing intact; oropharynx clear; neck supple, no JVP Lymph- no cervical lymphadenopathy Lungs-  Clear to ausculation bilaterally, normal work of breathing.  No wheezes, rales, rhonchi Heart- Irregular rate and rhythm, no murmurs, rubs or gallops, PMI not laterally displaced GI- soft, non-tender, non-distended, bowel sounds present Extremities- no clubbing, cyanosis, trace-1+ edema; DP/PT/radial pulses 2+ bilaterally MS- no significant deformity or atrophy Skin- warm and dry, no rash or lesion Psych- euthymic mood, full affect Neuro- no gross deficits observed  Labs:   Lab Results  Component Value Date   WBC 7.4 02/28/2016   HGB 16.3 02/28/2016   HCT 48.0 02/28/2016   MCV 87.6 02/28/2016   PLT 206 02/28/2016    Recent Labs Lab 02/28/16 0215 02/28/16 0305  NA 140 140  K 4.2 4.0  CL 106 104  CO2 23  --   BUN 26* 27*  CREATININE 1.15 1.00  CALCIUM 9.3  --   GLUCOSE 225* 214*      Radiology/Studies:  Dg Chest 2 View 02/28/2016  CLINICAL DATA:  Defibrillator fired 4 times. Acute onset of shortness of breath. Initial encounter. EXAM: CHEST  2 VIEW COMPARISON:  Chest radiograph performed 02/12/2016 FINDINGS: The lungs are well-aerated. Vascular congestion is noted. Patchy bibasilar airspace opacities may reflect mild interstitial edema. There is no evidence of pleural effusion or pneumothorax. The heart is borderline normal in size. The patient is status post median sternotomy, with evidence of prior CABG. An AICD is noted at the left chest wall, with a single lead ending at the right ventricle. No acute osseous abnormalities are seen. IMPRESSION: Vascular congestion noted. Patchy bibasilar airspace opacities may reflect mild interstitial edema. Electronically Signed   By: Garald Balding M.D.   On: 02/28/2016 01:21    Dg Shoulder Right 02/27/2016  AP lateral right shoulder Right shoulder pain X-ray report dictated by Dr. Aline Brochure Chest wires are seen in the chest area from previous surgery. Greater tuberosity has sclerosis and cyst formation. Mild undersurface acromial sclerotic  bone is seen as well. Proximal migration of the humeral head is minor with break in Shenton's line of the shoulder Impression chronic rotator cuff disease   EKG: Afib, noting intermittent RBBB TELEMETRY: AFib with CVR, 70's-80's at this time  10/20/12 Echocardiogram Study Conclusions - Left ventricle: The cavity size was normal. There was mild concentric hypertrophy. The estimated ejection fraction was 25%. Diffuse hypokinesis. There is akinesis of the inferolateral and inferior myocardium. - Mitral valve: Mild regurgitation. - Left atrium: The atrium was moderately dilated. - Right atrium: The atrium was moderately dilated.  Initial Diagnostic cath 10/21/12 Hemodynamic Findings:  Central aortic pressure: 96/67  Left ventricular pressure: 98/14/21  Angiographic Findings:  Left main: 10% stenosis.  Left Anterior Descending Artery: 100% ostial occlusion. The mid and distal vessel fills from the patent LIMA graft.  Circumflex Artery: Large caliber vessel with 99% stenosis in the proximal vessel just beyond the early first OM branch. The second OM branch arises just beyond the severe stenosis. The remainder of this vessel is disease free.  Right Coronary Artery: Large caliber, dominant vessel with 30% proximal stenosis, diffuse 80-90% stenosis mid vessel with discreet 99% stenosis in the mid vessel. The distal vessel has mild plaque. The touchdown site of the occluded vein graft is seen in the distal vessel just before the bifurcation. Both the PDA and PLA are moderate caliber vessels with mild plaque disease.  Graft Anatomy:  LIMA to LAD is patent  SVG to Diagonal is patent. The mid body of the vein graft has a prominent valve followed by a 30%, hazy stenosis just beyond the valve.  SVG to PDA is occluded  Left Ventricular Angiogram: Deferred.  Impression:  1. Triple vessel CAD s/p 3V CABG with 2/3 patent bypass grafts.  2. Severe stenosis proximal Circumflex, culprit  vessel. Now s/p successful PTCA/DES x 1 proximal Circumflex.  3. Stenosis in mid body of vein graft to Diagonal  4. Severe stenosis mid RCA, occluded vein graft to the distal RCA  5. Ischemic cardiomyopathy based on reduced LVEF on echo.  Recommendations: I carefully reviewed the anatomy before the PCI. I considered redo CABG but the patient did not wish to consider CABG. I felt that each lesion could be approached percutaneously. Since he will need a long segment of stenting in the RCA, a drug eluting platform was chosen for the Circumflex. There was an excellent result today in his culprit vessel, the Circumflex. Will continue ASA and Brilinta for now. Will review films with colleagues and plan PCI of the RCA before discharge (this would be best if this could be done before his coumadin is resumed). May also consider PCI of the vein graft to the Diagonal. I would plan on stopping his ASA in one month since he will need to be on Brilinta and coumadin.  >>>> 11/11/3: staged PCI to RCA Conclusions:  Successful complex PCI of the mid and distal right coronary artery using overlapping drug-eluting stents   02/28/16: Left lower extremity venous duplex completed.  Preliminary report: Left: No evidence of DVT, superficial thrombosis, or Baker's cyst.   DEVICE HISTORY:  [employer] single chamber ICD    Assessment and Plan:   1. ICD therapies Device interrogation reviewed with Dr. Lovena Le, (single lead device),  AFib with RVR as well as most likely VT     On amiodarone gtt Will ask [employer]. Rep to make some programming changes (make monitor zone 170bpm, change VT zone 200bpm/10 seconds, and VF 240bpm/5 seconds)  2. Permanent Afib     CHA2DS2Vasc is at least 4 on warfarin     Therapeutic, H/H recheck is stable  3. CAD     no CP     On ASA, statin, BB     Given VT, primary cardiology considering ischemic evaluation      4. ICM     On BB/ACE, lasix/K+     last known EF of  45-50% on echo 03/2014 (unable to find echo result on Care Everywhere, however documented on 10/06/14 note by Dr. Jac Canavan)     S/p IV lasix x1 dose with some edema on his CXR     Pending  new echo        Venetia Night, PA-C 02/28/2016 10:02 AM   EP Attending  Patient seen and examined. Agree with the findings as noted above. CV- IRIR, LUngs clear, ext. - no edema, neuro - nonfocal. ECG - atrial fib with multiple different QRS complexes in the setting of tachycardia over 200/min. I suspect he has both VT and atrial fib but he is going too fast and will start amiodarone. We will reprogram his ICD. I suspect he will require a couple of days getting amiodarone. I will defer to primary cardiology team regarding decision to perform heart cath.   Mikle Bosworth.D.

## 2016-02-28 NOTE — ED Notes (Signed)
Pt transported to vascular lab for study.

## 2016-02-28 NOTE — Progress Notes (Signed)
Patient Name: George Pollard Date of Encounter: 02/28/2016  Primary cardiologist: Dr. Hamilton Capri (prior - Dr. Remer Macho at [facility] and Dr. Dannielle Burn) Pt. Profile: Patient is a 82 yo M with h/o CAD s/p CABG and multiple PCIs, ischemic cardiomyopathy s/p AICD, DM, HTN, AAA  and atrial fibrillation who presents with AICD firing x4.   SUBJECTIVE  Feeling well. No chest pain, sob or palpitations.   CURRENT MEDS . aspirin EC  81 mg Oral Daily  . atorvastatin  80 mg Oral Daily  . lisinopril  2.5 mg Oral Daily  . metoprolol tartrate  75 mg Oral BID  . warfarin  10 mg Oral UD    OBJECTIVE  Filed Vitals:   02/28/16 0545 02/28/16 0700 02/28/16 0715 02/28/16 0919  BP: 107/61 106/63 106/62 126/84  Pulse: 65 67 71 74  Temp:      TempSrc:      Resp: '17 16 17   '$ SpO2: 97% 96% 99%    No intake or output data in the 24 hours ending 02/28/16 0920 There were no vitals filed for this visit.  PHYSICAL EXAM  General: Pleasant, NAD. Neuro: Alert and oriented X 3. Moves all extremities spontaneously. Psych: Normal affect. HEENT:  Normal  Neck: Supple without bruits. JVD difficult to assess due to girth.  Lungs:  Resp regular and unlabored, CTA. Heart: Ir Ir  no s3, s4, or murmurs. Abdomen: Soft, non-tender, non-distended, BS + x 4.  Extremities: No clubbing, cyanosis. BL LE edema L > R. . DP/PT/Radials 2+ and equal bilaterally.  Accessory Clinical Findings  CBC  Recent Labs  02/28/16 0215 02/28/16 0305  WBC 7.4  --   NEUTROABS 6.1  --   HGB 10.9* 16.3  HCT 35.9* 48.0  MCV 87.6  --   PLT 206  --    Basic Metabolic Panel  Recent Labs  02/28/16 0215 02/28/16 0305  NA 140 140  K 4.2 4.0  CL 106 104  CO2 23  --   GLUCOSE 225* 214*  BUN 26* 27*  CREATININE 1.15 1.00  CALCIUM 9.3  --   MG 2.2  --    Liver Function Tests No results for input(s): AST, ALT, ALKPHOS, BILITOT, PROT, ALBUMIN in the last 72 hours. No results for input(s): LIPASE, AMYLASE in the last 72  hours. Cardiac Enzymes  Recent Labs  02/28/16 0215  TROPONINI <0.03    TELE  afib  Radiology/Studies  Dg Chest 2 View  02/28/2016  CLINICAL DATA:  Defibrillator fired 4 times. Acute onset of shortness of breath. Initial encounter. EXAM: CHEST  2 VIEW COMPARISON:  Chest radiograph performed 02/12/2016 FINDINGS: The lungs are well-aerated. Vascular congestion is noted. Patchy bibasilar airspace opacities may reflect mild interstitial edema. There is no evidence of pleural effusion or pneumothorax. The heart is borderline normal in size. The patient is status post median sternotomy, with evidence of prior CABG. An AICD is noted at the left chest wall, with a single lead ending at the right ventricle. No acute osseous abnormalities are seen. IMPRESSION: Vascular congestion noted. Patchy bibasilar airspace opacities may reflect mild interstitial edema. Electronically Signed   By: Garald Balding M.D.   On: 02/28/2016 01:21   Dg Chest 2 View  02/12/2016  CLINICAL DATA:  Coronary disease, hypertension, atrial fibrillation, diabetes mellitus, CHF, waxing and weaning sharp posterior RIGHT chest and shoulder pain beginning 4 months ago worsened with deep breathing, pain runs down arm EXAM: CHEST  2 VIEW COMPARISON:  12/20/2015  FINDINGS: LEFT subclavian transvenous AICD lead projects at RIGHT ventricle. Enlargement of cardiac silhouette post CABG and coronary stenting. Calcified tortuous thoracic aorta. Pulmonary vascularity normal. Emphysematous and bronchitic changes consistent with COPD. Bibasilar atelectasis. No acute infiltrate, pleural effusion or pneumothorax. Bones demineralized. IMPRESSION: COPD changes with bibasilar atelectasis. Enlargement of cardiac silhouette post CABG, coronary stenting and AICD placement. Electronically Signed   By: Lavonia Dana M.D.   On: 02/12/2016 12:02   Dg Shoulder Right  02/27/2016  AP lateral right shoulder Right shoulder pain X-ray report dictated by Dr. Aline Brochure  Chest wires are seen in the chest area from previous surgery. Greater tuberosity has sclerosis and cyst formation. Mild undersurface acromial sclerotic bone is seen as well. Proximal migration of the humeral head is minor with break in Shenton's line of the shoulder Impression chronic rotator cuff disease    Initial diagnostic cath 10/21/12 Hemodynamic Findings:  Central aortic pressure: 96/67  Left ventricular pressure: 98/14/21  Angiographic Findings:  Left main: 10% stenosis.  Left Anterior Descending Artery: 100% ostial occlusion. The mid and distal vessel fills from the patent LIMA graft.  Circumflex Artery: Large caliber vessel with 99% stenosis in the proximal vessel just beyond the early first OM branch. The second OM branch arises just beyond the severe stenosis. The remainder of this vessel is disease free.  Right Coronary Artery: Large caliber, dominant vessel with 30% proximal stenosis, diffuse 80-90% stenosis mid vessel with discreet 99% stenosis in the mid vessel. The distal vessel has mild plaque. The touchdown site of the occluded vein graft is seen in the distal vessel just before the bifurcation. Both the PDA and PLA are moderate caliber vessels with mild plaque disease.  Graft Anatomy:  LIMA to LAD is patent  SVG to Diagonal is patent. The mid body of the vein graft has a prominent valve followed by a 30%, hazy stenosis just beyond the valve.  SVG to PDA is occluded  Left Ventricular Angiogram: Deferred.  Impression:  1. Triple vessel CAD s/p 3V CABG with 2/3 patent bypass grafts.  2. Severe stenosis proximal Circumflex, culprit vessel. Now s/p successful PTCA/DES x 1 proximal Circumflex.  3. Stenosis in mid body of vein graft to Diagonal  4. Severe stenosis mid RCA, occluded vein graft to the distal RCA  5. Ischemic cardiomyopathy based on reduced LVEF on echo.  Recommendations: I carefully reviewed the anatomy before the PCI. I considered redo CABG but  the patient did not wish to consider CABG. I felt that each lesion could be approached percutaneously. Since he will need a long segment of stenting in the RCA, a drug eluting platform was chosen for the Circumflex. There was an excellent result today in his culprit vessel, the Circumflex. Will continue ASA and Brilinta for now. Will review films with colleagues and plan PCI of the RCA before discharge (this would be best if this could be done before his coumadin is resumed). May also consider PCI of the vein graft to the Diagonal. I would plan on stopping his ASA in one month since he will need to be on Brilinta and coumadin.    ASSESSMENT AND PLAN   1. AICD firing x4. Interrogation strips reviewed and event appeared to be true ventricular tachycardia, likely scar mediated given his history and in setting of volume overload. No electrolyte abnormalities. Troponin negative. -- continue amiodarone gtt. Will get TSH and LFTs.  -- Pending EP to see -- consider repeat ischemic evaluation (last cath in 2013 as below)  1. Triple vessel CAD s/p 3V CABG with 2/3 patent bypass grafts.  2. Severe stenosis proximal Circumflex, culprit vessel. Now s/p successful PTCA/DES x 1 proximal Circumflex.  3. Stenosis in mid body of vein graft to Diagonal  4. Severe stenosis mid RCA, occluded vein graft to the distal RCA  5. Ischemic cardiomyopathy based on reduced LVEF on echo.              Dr. Angelena Form considered redo CABG but the patient did not wish to consider CABG.   2. CAD s/p CABG and multiple PCIs. No current anginal symptoms. -- Continue aspirin, atorvastatin, metoprolol and lisinopril -- possible ischemic evaluation, as above  3. Ischemic cardiomyopathy, last known EF of 45-50% on echo 03/2014 (unable to find echo result on Care Everywhere, however documented on 10/06/14 note by Dr. Jac Canavan) -- BNP of 242.2 with mild interstitial edema.  -- Given IV lasix '40mg'$  x 1. No output recorded. Lungs relatively  clear.  -- continue HF medications: lisinopril, metoprolol. Will discuss diuretics with MD.  -- Will get repeat echocardiogram.   4. LE edema L > R and pain -- Obtain LE Dopplers to rule-out DVT by fellow. The patient is on Coumadin. Small open ulcer on medial aspect of LLE. Has obvious verucose veins. No warm to touch. Vascular insufficiency?  5. Chronic atrial fibrillation. On Coumadin for stroke ppx. Currently rate controlled. -- continue metoprolol and Coumadin --  INR 2.64  6. R shoulder pain - Xray showed chronic rotator cuff disease  7. DM - Will hold metformin and place on SSI - Check A1c  8. HL - Continue high dose statin - Will get lipid panel   Dispo: If plan for cath. Hold coumadin tonight and start Heparin when INR less than 2.0. MD to see.   Signed, Leanor Kail PA-C Pager [phone]  EP Attending  Agree with the findings as noted above. We will hold off on heart cath.  Mikle Bosworth.D.

## 2016-02-28 NOTE — H&P (Signed)
HPI:  Patient is a 82 yo M with h/o CAD s/p CABG and multiple PCIs, ischemic cardiomyopathy (EF 25% in '13) s/p AICD, and atrial fibrillation who presents with AICD firing x4.  Patient was in his normal state of health, when he was sitting on the couch and was suddenly shocked by his [employer] single-lead defibrillator, four consecutive times.  He denies any preceding symptoms, such as chest pain, diaphoresis, palpitations, pre-syncope/syncope.  No recent illness, vomiting, diarrhea, or fever.  No new medication changes.  He does report that earlier that day he took his morning metoprolol 75 mg a few hours later than normal because he had a doctors appointment.  His device has only shocked him once before right after it was implanted.  The parameters were adjusted and he hasn't had an issue until this evening.  He has had significant right shoulder pain for the last three months, which has made it harder for him to be active, so he and his wife feel that he has been more short of breath recently.  His device was interrogated remotely and strips were sent to the ED via fax: [employer], Incepta ICD Implant date 01/07/13  Battery: ~ 11 years  RV lead: Amplitude 11.6 mV Impedence 443 ohms Threshold 0.6 @ 0.5 ms Shock Impedence 92 ohms  Tachytherapy Settings VT-1 170 bpm - monitor only VT 200 bpm: 41 J, 41J, 41 J x4 VF 250 bpm: ATP, 41 J, 41 J, 41 J x6  Brady Settings VVI, lower rate 40 bpm  Pacing output 2.0 V @ 0.5 ms  Events: since 10/24/15, 252 recorded ventricular episodes ATP delivered 8 time (60% success) 4 shocks delivery (on 02/27/16) for VT at 215 bpm  0% V-paced  Review of Systems:     Cardiac Review of Systems: {Y] = yes '[ ]'$  = no  Chest Pain [   n ]  Resting SOB [  n ] Exertional SOB  [  ]  Orthopnea [  ]   Pedal Edema [   ]    Palpitations [ n ] Syncope  [ n ]   Presyncope [   ]  General Review of Systems: [Y] = yes [  ]=no Constitional: recent  weight change [  ]; anorexia [  ]; fatigue [  ]; nausea [  ]; night sweats [  ]; fever [  ]; or chills [  ];                                                                     Dental: poor dentition[  ];   Eye : blurred vision [  ]; diplopia [   ]; vision changes [  ];  Amaurosis fugax[  ]; Resp: cough [ n ];  wheezing[ n ];  hemoptysis[  ]; shortness of breath[  ]; paroxysmal nocturnal dyspnea[  ]; dyspnea on exertion[  ]; or orthopnea[ n ];  GI:  gallstones[  ], vomiting[  ];  dysphagia[  ]; melena[  ];  hematochezia [  ]; heartburn[  ];   GU: kidney stones [  ]; hematuria[  ];   dysuria [  ];  nocturia[  ];  Skin: rash [  ], swelling[  ];, hair loss[  ];  peripheral edema[  ];  or itching[  ]; Musculosketetal: myalgias[  ];  joint swelling[  ];  joint erythema[  ];  joint pain[ y ];  back pain[  ];  Heme/Lymph: bruising[  ];  bleeding[  ];  anemia[  ];  Neuro: TIA[  ];  headaches[  ];  stroke[  ];  vertigo[  ];  seizures[  ];   paresthesias[  ];  difficulty walking[  ];  Psych:depression[  ]; anxiety[  ];  Endocrine: diabetes[  ];  thyroid dysfunction[  ];  Other:  Past Medical History  Diagnosis Date  . CAD (coronary artery disease)     a. CABG 1997. b. NSTEMI (troponin >20) s/p PTCA/DES to LCx 10/21/12, overlapping DES to mid & distal RCA 10/26/12.  . Atrial fibrillation ([place])   . Hypertension   . BPH (benign prostatic hyperplasia)   . PVD (peripheral vascular disease) ([place])     a. AAA repair 2001.  Marland Kitchen Systolic CHF ([place])     a. 10/2012: acute on chronic systolic CHF / pulmonary edema secondary to severe mixed cardiomyopathy.  . Diabetes mellitus ([place])     a. Noted 10/2012 (prior hx of DM resolved with weight loss).   Current Facility-Administered Medications  Medication Dose Route Frequency Provider Last Rate Last Dose  . amiodarone (NEXTERONE PREMIX) 360 MG/200ML (1.8 mg/mL) IV infusion  60 mg/hr Intravenous Continuous Kristen N Ward, DO 33.3 mL/hr at 02/28/16 0143 60  mg/hr at 02/28/16 0143   Followed by  . amiodarone (NEXTERONE PREMIX) 360 MG/200ML (1.8 mg/mL) IV infusion  30 mg/hr Intravenous Continuous Kristen N Ward, DO       Current Outpatient Prescriptions  Medication Sig Dispense Refill  . aspirin EC 81 MG tablet Take 81 mg by mouth daily.    Marland Kitchen atorvastatin (LIPITOR) 80 MG tablet take 1 tablet by mouth once daily 30 tablet 2  . furosemide (LASIX) 40 MG tablet Take 1 tablet (40 mg total) by mouth 2 (two) times daily. 60 tablet 2  . HYDROcodone-acetaminophen (NORCO) 5-325 MG tablet Take 1 tablet by mouth every 4 (four) hours as needed. 10 tablet 0  . lisinopril (PRINIVIL,ZESTRIL) 2.5 MG tablet take 1 tablet by mouth once daily 30 tablet 2  . metFORMIN (GLUCOPHAGE) 500 MG tablet Take 500 mg by mouth daily.   0  . metoprolol succinate (TOPROL-XL) 100 MG 24 hr tablet Take 1 tablet (100 mg total) by mouth 2 (two) times daily. 60 tablet 2  . nitroGLYCERIN (NITROSTAT) 0.4 MG SL tablet Place 1 tablet (0.4 mg total) under the tongue every 5 (five) minutes as needed for chest pain (up to 3 doses). 25 tablet 2  . potassium chloride SA (K-DUR,KLOR-CON) 20 MEQ tablet Take 1 tablet (20 mEq total) by mouth daily. 30 tablet 2  . warfarin (COUMADIN) 10 MG tablet Take 1 tablet (10 mg total) by mouth as directed. You will be given 12.'5mg'$  to take tonight (10/29/12). Take one '10mg'$  tablet by mouth Friday (11/15), Saturday (11/16), and Sunday (11/17). Please have your Coumadin level ("INR") checked on Monday 11/02/12. (Patient taking differently: Take 10 mg by mouth See admin instructions. Take 1/2 tablet on Sunday then take 1 tablet all the other days)      No Known Allergies  Social History   Social History  . Marital Status: Married    Spouse Name: N/A  . Number of Children: 4  .  Years of Education: N/A   Occupational History  . Not on file.   Social History Main Topics  . Smoking status: Former Smoker -- 20 years  . Smokeless tobacco: Not on file     Comment:  Quit 1997  . Alcohol Use: No  . Drug Use: No  . Sexual Activity: Not on file   Other Topics Concern  . Not on file   Social History Narrative   Lives in [place].  Lives with wife.      Family History  Problem Relation Age of Onset  . Valvular heart disease Daughter     MVP repair    PHYSICAL EXAM: Filed Vitals:   02/28/16 0245 02/28/16 0300  BP: 121/72 106/65  Pulse: 66 62  Temp:    Resp: 20 17  94% on RA  General:  Well appearing. No respiratory difficulty HEENT: normal Neck: supple. JVP difficult to assess.  Cor: Distant heart sounds, irregularly irregulat Lungs: decreased breath sounds at the bases Abdomen: soft, nontender, obese. No hepatosplenomegaly. No bruits or masses. Good bowel sounds. Extremities: no cyanosis, clubbing. 2+ pitting edema in LLE, 1+in RLE., small open ulcer on medial aspect of LLE Neuro: alert & oriented x 3, cranial nerves grossly intact. moves all 4 extremities w/o difficulty. Affect pleasant.  ECG: atrial fibrillation with two distinct QRS morphologies   Results for orders placed or performed during the hospital encounter of 02/28/16 (from the past 24 hour(s))  CBC with Differential     Status: Abnormal   Collection Time: 02/28/16  2:15 AM  Result Value Ref Range   WBC 7.4 4.0 - 10.5 K/uL   RBC 4.10 (L) 4.22 - 5.81 MIL/uL   Hemoglobin 10.9 (L) 13.0 - 17.0 g/dL   HCT 35.9 (L) 39.0 - 52.0 %   MCV 87.6 78.0 - 100.0 fL   MCH 26.6 26.0 - 34.0 pg   MCHC 30.4 30.0 - 36.0 g/dL   RDW 17.9 (H) 11.5 - 15.5 %   Platelets 206 150 - 400 K/uL   Neutrophils Relative % 83 %   Neutro Abs 6.1 1.7 - 7.7 K/uL   Lymphocytes Relative 8 %   Lymphs Abs 0.6 (L) 0.7 - 4.0 K/uL   Monocytes Relative 9 %   Monocytes Absolute 0.7 0.1 - 1.0 K/uL   Eosinophils Relative 0 %   Eosinophils Absolute 0.0 0.0 - 0.7 K/uL   Basophils Relative 0 %   Basophils Absolute 0.0 0.0 - 0.1 K/uL  Basic metabolic panel     Status: Abnormal   Collection Time: 02/28/16  2:15 AM    Result Value Ref Range   Sodium 140 135 - 145 mmol/L   Potassium 4.2 3.5 - 5.1 mmol/L   Chloride 106 101 - 111 mmol/L   CO2 23 22 - 32 mmol/L   Glucose, Bld 225 (H) 65 - 99 mg/dL   BUN 26 (H) 6 - 20 mg/dL   Creatinine, Ser 1.15 0.61 - 1.24 mg/dL   Calcium 9.3 8.9 - 10.3 mg/dL   GFR calc non Af Amer 58 (L) >60 mL/min   GFR calc Af Amer >60 >60 mL/min   Anion gap 11 5 - 15  Troponin I     Status: None   Collection Time: 02/28/16  2:15 AM  Result Value Ref Range   Troponin I <0.03 <0.031 ng/mL  Brain natriuretic peptide     Status: Abnormal   Collection Time: 02/28/16  2:15 AM  Result Value Ref Range  B Natriuretic Peptide 242.2 (H) 0.0 - 100.0 pg/mL  Magnesium     Status: None   Collection Time: 02/28/16  2:15 AM  Result Value Ref Range   Magnesium 2.2 1.7 - 2.4 mg/dL  I-stat troponin, ED     Status: None   Collection Time: 02/28/16  2:17 AM  Result Value Ref Range   Troponin i, poc 0.00 0.00 - 0.08 ng/mL   Comment 3          I-stat chem 8, ed     Status: Abnormal   Collection Time: 02/28/16  3:05 AM  Result Value Ref Range   Sodium 140 135 - 145 mmol/L   Potassium 4.0 3.5 - 5.1 mmol/L   Chloride 104 101 - 111 mmol/L   BUN 27 (H) 6 - 20 mg/dL   Creatinine, Ser 1.00 0.61 - 1.24 mg/dL   Glucose, Bld 214 (H) 65 - 99 mg/dL   Calcium, Ion 1.15 1.13 - 1.30 mmol/L   TCO2 23 0 - 100 mmol/L   Hemoglobin 16.3 13.0 - 17.0 g/dL   HCT 48.0 39.0 - 52.0 %   Dg Chest 2 View  02/28/2016  CLINICAL DATA:  Defibrillator fired 4 times. Acute onset of shortness of breath. Initial encounter. EXAM: CHEST  2 VIEW COMPARISON:  Chest radiograph performed 02/12/2016 FINDINGS: The lungs are well-aerated. Vascular congestion is noted. Patchy bibasilar airspace opacities may reflect mild interstitial edema. There is no evidence of pleural effusion or pneumothorax. The heart is borderline normal in size. The patient is status post median sternotomy, with evidence of prior CABG. An AICD is noted at the  left chest wall, with a single lead ending at the right ventricle. No acute osseous abnormalities are seen. IMPRESSION: Vascular congestion noted. Patchy bibasilar airspace opacities may reflect mild interstitial edema. Electronically Signed   By: Garald Balding M.D.   On: 02/28/2016 01:21   Dg Shoulder Right  02/27/2016  AP lateral right shoulder Right shoulder pain X-ray report dictated by Dr. Aline Brochure Chest wires are seen in the chest area from previous surgery. Greater tuberosity has sclerosis and cyst formation. Mild undersurface acromial sclerotic bone is seen as well. Proximal migration of the humeral head is minor with break in Shenton's line of the shoulder Impression chronic rotator cuff disease  TTE 11/13 Left ventricle: The cavity size was normal. There was mild concentric hypertrophy. The estimated ejection fraction was 25%. Diffuse hypokinesis. There is akinesis of the inferolateral and inferior myocardium. - Mitral valve: Mild regurgitation. - Left atrium: The atrium was moderately dilated. - Right atrium: The atrium was moderately dilated.  ASSESSMENT/PLAN:  1. AICD firing x4. Interrogation strips reviewed and event appeared to be true ventricular tachycardia, likely scar mediated given his history and in setting of volume overload.  No electrolyte abnormalities. Troponin negative. -- continue amiodarone gtt -- EP to evaluate later this morning -- consider repeat ischemic evaluation (last cath in 2013) -- Diurese, monitor electrolytes  2. CAD s/p CABG and multiple PCIs. No current anginal symptoms. -- Continue aspirin, atorvastatin, metoprolol -- possible ischemic evaluation, as above  3. Ischemic cardiomyopathy, last known EF 25% with interior akinesis -- continue HF medications: lisinopril, metoprolol -- diuresis with IV lasix ('40mg'$  IV given in ED at 0230), assess response and redose   4. LE edema and pain -- Obtain LE Dopplers to rule-out DVT  5. Chronic  atrial fibrillation.  On Coumadin for stroke ppx. Currently rate controlled. -- continue metoprolol and Coumadin -- check INR

## 2016-02-28 NOTE — Progress Notes (Signed)
VASCULAR LAB PRELIMINARY  PRELIMINARY  PRELIMINARY  PRELIMINARY  Left lower extremity venous duplex completed.    Preliminary report:  Left:  No evidence of DVT, superficial thrombosis, or Baker's cyst.  Vallerie Hentz, RVS 02/28/2016, 11:18 AM

## 2016-02-28 NOTE — ED Notes (Signed)
Dinner tray ordered, heart healthy/carb modified

## 2016-02-29 ENCOUNTER — Inpatient Hospital Stay (HOSPITAL_COMMUNITY): Payer: Medicare Other

## 2016-02-29 DIAGNOSIS — I4891 Unspecified atrial fibrillation: Secondary | ICD-10-CM

## 2016-02-29 DIAGNOSIS — I472 Ventricular tachycardia: Principal | ICD-10-CM

## 2016-02-29 LAB — HEMOGLOBIN A1C
HEMOGLOBIN A1C: 7.6 % — AB (ref 4.8–5.6)
MEAN PLASMA GLUCOSE: 171 mg/dL

## 2016-02-29 LAB — MRSA PCR SCREENING: MRSA by PCR: POSITIVE — AB

## 2016-02-29 LAB — CBC
HEMATOCRIT: 36.2 % — AB (ref 39.0–52.0)
HEMOGLOBIN: 11 g/dL — AB (ref 13.0–17.0)
MCH: 26.8 pg (ref 26.0–34.0)
MCHC: 30.4 g/dL (ref 30.0–36.0)
MCV: 88.3 fL (ref 78.0–100.0)
Platelets: 184 10*3/uL (ref 150–400)
RBC: 4.1 MIL/uL — AB (ref 4.22–5.81)
RDW: 18.3 % — ABNORMAL HIGH (ref 11.5–15.5)
WBC: 8.1 10*3/uL (ref 4.0–10.5)

## 2016-02-29 LAB — GLUCOSE, CAPILLARY
GLUCOSE-CAPILLARY: 141 mg/dL — AB (ref 65–99)
GLUCOSE-CAPILLARY: 136 mg/dL — AB (ref 65–99)
GLUCOSE-CAPILLARY: 131 mg/dL — AB (ref 65–99)
Glucose-Capillary: 136 mg/dL — ABNORMAL HIGH (ref 65–99)

## 2016-02-29 LAB — BASIC METABOLIC PANEL
Anion gap: 7 (ref 5–15)
BUN: 21 mg/dL — AB (ref 6–20)
CHLORIDE: 105 mmol/L (ref 101–111)
CO2: 27 mmol/L (ref 22–32)
Calcium: 9.2 mg/dL (ref 8.9–10.3)
Creatinine, Ser: 1.15 mg/dL (ref 0.61–1.24)
GFR calc Af Amer: >60 mL/min (ref >60)
GFR calc non Af Amer: 58 mL/min — ABNORMAL LOW (ref >60)
Glucose, Bld: 155 mg/dL — ABNORMAL HIGH (ref 65–99)
Potassium: 4.4 mmol/L (ref 3.5–5.1)
SODIUM: 139 mmol/L (ref 135–145)

## 2016-02-29 LAB — ECHOCARDIOGRAM COMPLETE
HEIGHTINCHES: 70 in
WEIGHTICAEL: 3515.2 [oz_av]

## 2016-02-29 LAB — PROTIME-INR
INR: 2.99 — ABNORMAL HIGH (ref 0.00–1.49)
Prothrombin Time: 30.5 seconds — ABNORMAL HIGH (ref 11.6–15.2)

## 2016-02-29 MED ORDER — WARFARIN SODIUM 7.5 MG PO TABS: 7.5000 mg | ORAL_TABLET | Freq: Once | ORAL | Status: DC

## 2016-02-29 MED ORDER — CHLORHEXIDINE GLUCONATE CLOTH 2 % EX PADS
6.0000 | MEDICATED_PAD | Freq: Every day | CUTANEOUS | Status: DC
  Administered 2016-02-29 – 2016-03-01 (×2): 6 via TOPICAL

## 2016-02-29 MED ORDER — MUPIROCIN 2 % EX OINT
1.0000 "application " | TOPICAL_OINTMENT | Freq: Two times a day (BID) | CUTANEOUS | Status: DC
  Administered 2016-02-29 – 2016-03-01 (×4): 1 via NASAL
  Filled 2016-02-29: qty 22

## 2016-02-29 NOTE — Progress Notes (Signed)
[place] for heparin Indication: atrial fibrillation  No Known Allergies  Patient Measurements: Height: '5\' 10"'$  (177.8 cm) Weight: 219 lb 11.2 oz (99.655 kg) IBW/kg (Calculated) : 73  Vital Signs: Temp: 97.9 F (36.6 C) (03/16 1703) Temp Source: Oral (03/16 1703) BP: 132/64 mmHg (03/16 1703) Pulse Rate: 67 (03/16 1703)  Labs:  Recent Labs  02/28/16 0215 02/28/16 0305 02/29/16 0519  HGB 10.9* 16.3 11.0*  HCT 35.9* 48.0 36.2*  PLT 206  --  184  LABPROT 27.8*  --  30.5*  INR 2.64*  --  2.99*  CREATININE 1.15 1.00 1.15  TROPONINI <0.03  --   --     Estimated Creatinine Clearance: 60.7 mL/min (by C-G formula based on Cr of 1.15).  Assessment: 67 yom was on chronic coumadin for history of afib which has now been discontinued. INR remains therapeutic at 2.99. Now to switch to IV heparin when INR < 2 for possible cath.    Goal of Therapy:  INR 2-3 Monitor platelets by anticoagulation protocol: Yes   Plan:  -Check daily PT/INR and start IV heparin when INR < 2.  Albertina Parr, PharmD., BCPS Clinical Pharmacist Pager [phone]

## 2016-02-29 NOTE — Progress Notes (Signed)
SUBJECTIVE: The patient is doing well today.  At this time, he denies chest pain, shortness of breath, or any new concerns.  Marland Kitchen aspirin EC  81 mg Oral Daily  . atorvastatin  80 mg Oral Daily  . Chlorhexidine Gluconate Cloth  6 each Topical Q0600  . insulin aspart  0-9 Units Subcutaneous TID WC  . lisinopril  2.5 mg Oral Daily  . metoprolol tartrate  75 mg Oral BID  . mupirocin ointment  1 application Nasal BID  . Warfarin - Pharmacist Dosing Inpatient   Does not apply q1800   . amiodarone 30 mg/hr (02/29/16 0048)    OBJECTIVE: Physical Exam: Filed Vitals:   02/28/16 2134 02/29/16 0007 02/29/16 0500 02/29/16 0800  BP: 144/73 134/79 132/87 129/78  Pulse:  86 70 71  Temp: 98 F (36.7 C) 97.5 F (36.4 C) 97.8 F (36.6 C)   TempSrc: Oral Oral Oral   Resp: 19     Height: '5\' 10"'$  (1.778 m)     Weight: 220 lb 4.8 oz (99.927 kg)  219 lb 11.2 oz (99.655 kg)   SpO2: 96% 94% 94% 94%    Intake/Output Summary (Last 24 hours) at 02/29/16 1143 Last data filed at 02/29/16 1007  Gross per 24 hour  Intake    240 ml  Output   2100 ml  Net  -1860 ml    Telemetry reveals Afib 60's-70's, no VT noted  GEN- The patient is well appearing, alert and oriented x 3 today.   Head- normocephalic, atraumatic Eyes-  Sclera clear, conjunctiva pink Ears- hearing intact Oropharynx- clear Neck- supple, no JVP Lungs- Clear to ausculation bilaterally, normal work of breathing Heart- Irrgular rate and rhythm, no significant murmurs, no rubs or gallops GI- soft, NT, ND Extremities- no clubbing, cyanosis, or edema Skin- no rash or lesion Psych- euthymic mood, full affect Neuro- no gross deficits appreciated  LABS: Basic Metabolic Panel:  Recent Labs  02/28/16 0215 02/28/16 0305 02/29/16 0519  NA 140 140 139  K 4.2 4.0 4.4  CL 106 104 105  CO2 23  --  27  GLUCOSE 225* 214* 155*  BUN 26* 27* 21*  CREATININE 1.15 1.00 1.15  CALCIUM 9.3  --  9.2  MG 2.2  --   --    Liver Function  Tests:  Recent Labs  02/28/16 1137  AST 28  ALT 25  ALKPHOS 68  BILITOT 0.7  PROT 6.8  ALBUMIN 3.2*   No results for input(s): LIPASE, AMYLASE in the last 72 hours. CBC:  Recent Labs  02/28/16 0215 02/28/16 0305 02/29/16 0519  WBC 7.4  --  8.1  NEUTROABS 6.1  --   --   HGB 10.9* 16.3 11.0*  HCT 35.9* 48.0 36.2*  MCV 87.6  --  88.3  PLT 206  --  184   Cardiac Enzymes:  Recent Labs  02/28/16 0215  TROPONINI <0.03   Hemoglobin A1C:  Recent Labs  02/28/16 1137  HGBA1C 7.6*   Fasting Lipid Panel:  Recent Labs  02/28/16 1137  CHOL 96  HDL 34*  LDLCALC 49  TRIG 65  CHOLHDL 2.8   Thyroid Function Tests:  Recent Labs  02/28/16 1136  TSH 2.278    RADIOLOGY: Dg Chest 2 View 02/28/2016  CLINICAL DATA:  Defibrillator fired 4 times. Acute onset of shortness of breath. Initial encounter. EXAM: CHEST  2 VIEW COMPARISON:  Chest radiograph performed 02/12/2016 FINDINGS: The lungs are well-aerated. Vascular congestion is noted. Patchy bibasilar  airspace opacities may reflect mild interstitial edema. There is no evidence of pleural effusion or pneumothorax. The heart is borderline normal in size. The patient is status post median sternotomy, with evidence of prior CABG. An AICD is noted at the left chest wall, with a single lead ending at the right ventricle. No acute osseous abnormalities are seen. IMPRESSION: Vascular congestion noted. Patchy bibasilar airspace opacities may reflect mild interstitial edema. Electronically Signed   By: Garald Balding M.D.   On: 02/28/2016 01:21     ASSESSMENT AND PLAN:  Active Problems:   Defibrillator discharge  1. ICD therapies Device interrogation reviewed with Dr. Lovena Le, (single lead device), AFib with RVR as well as most likely VT On amiodarone gtt, continue to load with plans for PO amio on discharge (reprogrammed to a monitor zone 170bpm, change VT zone 200bpm/10 seconds, and VF 240bpm/5 seconds)  2. Permanent Afib   CHA2DS2Vasc is at least 4 on warfarin  Therapeutic INR 2.99, management with pharmacy  3. CAD  no CP  On ASA, statin, BB  Given VT, primary cardiology considering ischemic evaluation, deferred to their service      Patient reports no stress testing or new caths since 2013 intervention   4. ICM  On BB/ACE, lasix/K+  last known EF of 45-50% on echo 03/2014 (unable to find echo result on Care Everywhere, however documented on 10/06/14 note by Dr. Jac Canavan)  S/p IV lasix x1 dose with some edema on his CXR, fluid negative - 2160     Exam appears compensated, laying supine with minimal elevation of the [facility] comfortably  Pending new echo-confirmed with echo dept, on schedule for today  Tommye Standard, Hershal Coria 02/29/2016 11:43 AM   EP Attending  Patient seen and examined. Agree with the findings as noted above. The patient's atrial fib is well controlled. He has not had any problems with IV amio. His INR is increased today. At this point, it would be reasonable to risk stratify with a lexiscan myoview. I would like him to go home on amio 400 bid for a week then 400 daily. If his Brantley Fling shows lots of ischemia, would keep him for heart cath. He would like to followup with our EP clinic in [place]. He is willing to switch his general cardiology to our [facility] or [place] office.  Mikle Bosworth.D.

## 2016-02-29 NOTE — Progress Notes (Signed)
  Echocardiogram 2D Echocardiogram has been performed.  Jennette Dubin 02/29/2016, 2:01 PM

## 2016-02-29 NOTE — Progress Notes (Signed)
UR Completed Lawana Hartzell Graves-Bigelow, RN,BSN [phone]  

## 2016-02-29 NOTE — Progress Notes (Deleted)
entered in error   

## 2016-02-29 NOTE — Progress Notes (Signed)
ANTICOAGULATION CONSULT NOTE   Pharmacy Consult for warfarin Indication: atrial fibrillation  No Known Allergies  Patient Measurements: Height: '5\' 10"'$  (177.8 cm) Weight: 219 lb 11.2 oz (99.655 kg) IBW/kg (Calculated) : 73  Vital Signs: Temp: 97.8 F (36.6 C) (03/16 0500) Temp Source: Oral (03/16 0500) BP: 129/78 mmHg (03/16 0800) Pulse Rate: 71 (03/16 0800)  Labs:  Recent Labs  02/28/16 0215 02/28/16 0305 02/29/16 0519  HGB 10.9* 16.3 11.0*  HCT 35.9* 48.0 36.2*  PLT 206  --  184  LABPROT 27.8*  --  30.5*  INR 2.64*  --  2.99*  CREATININE 1.15 1.00 1.15  TROPONINI <0.03  --   --     Estimated Creatinine Clearance: 60.7 mL/min (by C-G formula based on Cr of 1.15).  Assessment: 45 yom continues on chronic coumadin for history of afib. INR remains therapeutic at 2.99 but increases from yesterday. Pt was also started on amiodarone which may increase the INR. Likely will need to adjust outpatient warfarin regimen.   PTA dose: '10mg'$  daily except '5mg'$  on Sun (last dose pta 3/14)  Goal of Therapy:  INR 2-3 Monitor platelets by anticoagulation protocol: Yes   Plan:  - Warfarin 7.'5mg'$  PO x 1 tonight - Daily INR - Watch DDI with amiodarone  Salome Arnt, PharmD, BCPS Pager # [phone] 02/29/2016 1:03 PM

## 2016-03-01 ENCOUNTER — Inpatient Hospital Stay (HOSPITAL_COMMUNITY): Payer: Medicare Other

## 2016-03-01 ENCOUNTER — Encounter (HOSPITAL_COMMUNITY): Payer: Self-pay | Admitting: Nurse Practitioner

## 2016-03-01 DIAGNOSIS — I482 Chronic atrial fibrillation, unspecified: Secondary | ICD-10-CM

## 2016-03-01 DIAGNOSIS — I472 Ventricular tachycardia, unspecified: Secondary | ICD-10-CM

## 2016-03-01 DIAGNOSIS — I255 Ischemic cardiomyopathy: Secondary | ICD-10-CM

## 2016-03-01 DIAGNOSIS — I481 Persistent atrial fibrillation: Secondary | ICD-10-CM

## 2016-03-01 DIAGNOSIS — I25700 Atherosclerosis of coronary artery bypass graft(s), unspecified, with unstable angina pectoris: Secondary | ICD-10-CM

## 2016-03-01 LAB — NM MYOCAR MULTI W/SPECT W/WALL MOTION / EF
Estimated workload: 1 METS
Exercise duration (min): 0 min
Exercise duration (sec): 0 s
MPHR: 140 {beats}/min
Peak HR: 110 {beats}/min
Percent HR: 78 %
Rest HR: 83 {beats}/min

## 2016-03-01 LAB — PROTIME-INR
INR: 2.87 — AB (ref 0.00–1.49)
PROTHROMBIN TIME: 29.6 s — AB (ref 11.6–15.2)

## 2016-03-01 LAB — GLUCOSE, CAPILLARY
GLUCOSE-CAPILLARY: 139 mg/dL — AB (ref 65–99)
GLUCOSE-CAPILLARY: 206 mg/dL — AB (ref 65–99)
Glucose-Capillary: 136 mg/dL — ABNORMAL HIGH (ref 65–99)

## 2016-03-01 MED ORDER — METOPROLOL TARTRATE 50 MG PO TABS: 75.0000 mg | ORAL_TABLET | Freq: Two times a day (BID) | ORAL | Status: DC

## 2016-03-01 MED ORDER — WARFARIN SODIUM 5 MG PO TABS: 5.0000 mg | ORAL_TABLET | Freq: Every day | ORAL | Status: DC

## 2016-03-01 MED ORDER — TECHNETIUM TC 99M SESTAMIBI GENERIC - CARDIOLITE
10.0000 | Freq: Once | INTRAVENOUS | Status: AC | PRN
  Administered 2016-03-01: 10 via INTRAVENOUS

## 2016-03-01 MED ORDER — AMIODARONE HCL 200 MG PO TABS
400.0000 mg | ORAL_TABLET | Freq: Two times a day (BID) | ORAL | Status: DC
  Administered 2016-03-01: 400 mg via ORAL
  Filled 2016-03-01 (×2): qty 2

## 2016-03-01 MED ORDER — TECHNETIUM TC 99M SESTAMIBI GENERIC - CARDIOLITE
30.0000 | Freq: Once | INTRAVENOUS | Status: AC | PRN
  Administered 2016-03-01: 30 via INTRAVENOUS

## 2016-03-01 MED ORDER — AMIODARONE HCL 400 MG PO TABS: ORAL_TABLET | ORAL | Status: DC

## 2016-03-01 MED ORDER — REGADENOSON 0.4 MG/5ML IV SOLN
0.4000 mg | Freq: Once | INTRAVENOUS | Status: AC
  Administered 2016-03-01: 0.4 mg via INTRAVENOUS
  Filled 2016-03-01: qty 5

## 2016-03-01 MED ORDER — REGADENOSON 0.4 MG/5ML IV SOLN
INTRAVENOUS | Status: AC
Start: 2016-03-01 — End: 2016-03-01
  Administered 2016-03-01: 0.4 mg via INTRAVENOUS
  Filled 2016-03-01: qty 5

## 2016-03-01 NOTE — Progress Notes (Signed)
1 day lexiscan stress test completed without significant complication. Pending final result by [facility] radiology  Signed, Almyra Deforest PA Pager: [phone]

## 2016-03-01 NOTE — Discharge Instructions (Signed)
***  PLEASE REMEMBER TO BRING ALL OF YOUR MEDICATIONS TO EACH OF YOUR FOLLOW-UP OFFICE VISITS.  

## 2016-03-01 NOTE — Progress Notes (Signed)
EP plan as per Dr Lovena Le yesterday Plans for [facility] by general cardiology noted.  Would convert to oral amiodarone as outlined in Dr Forde Dandy notes  Electrophysiology team to see as needed while here. Please call with questions.

## 2016-03-01 NOTE — Discharge Summary (Signed)
Discharge Summary   Patient ID: George Pollard,  MRN: [id number], DOB/AGE: July 05, 1933 82 y.o.  Admit date: 02/28/2016 Discharge date: 03/01/2016  Primary Care Provider: [facility] Primary Cardiologist: G. Lovena Le, MD   Discharge Diagnoses    Principal Problem:   Ventricular tachycardia [facility]) Active Problems:   CAD (coronary artery disease) of artery bypass graft   Defibrillator discharge   Cardiomyopathy, ischemic   Chronic atrial fibrillation (HCC)  Allergies No Known Allergies  Diagnostic Studies/Procedures    2D Echocardiogram 3.16.2017  Study Conclusions   - Left ventricle: The cavity size was moderately dilated. Wall   thickness was normal. Systolic function was moderately to   severely reduced. The estimated ejection fraction was in the   range of 30% to 35%. Incoordinate septal motion and inferior   akinesis. The study is not technically sufficient to allow   evaluation of LV diastolic function. - Mitral valve: Calcified annulus. Mildly thickened leaflets .   There was mild regurgitation. - Right ventricle: The cavity size was severely dilated. Pacer wire   or catheter noted in right ventricle. Systolic function is   severely reduced. - Right atrium: Massively dilated at 36 cm2. Pacer wire or catheter   noted in right atrium. - Pulmonary arteries: PA peak pressure: 34 mm Hg (S). - Inferior vena cava: The vessel was dilated. The respirophasic   diameter changes were blunted (< 50%), consistent with elevated   central venous pressure.   Impressions:   - Compared to a prior study in 2013, the EF is slightly higher at   30-35% - difficult to assess wall motion given pacing/arthymia   but there appears to be inferior akinesis. _____________  Wille Glaser 3.17.2017  IMPRESSION: 1. No definite inducible ischemia with pharmacologic stress. Large fixed defect involving the apex, inferior, septal and lateral walls. 2. Global hypokinesis with septal  dyskinesia. 3. Left ventricular ejection fraction 37% 4. High-risk stress test findings*. _____________    History of Present Illness  82 y/o ? with a h/o CAD, ICM, AF on coumadin, and chronic systolic CHF.  He was in his USOH until the day of admission when he suffered 4 ICD shocks.  He presented to the [facility] ED.  His device was interrogated in the ED and he was found to have 4 appropriate shocks for VT.  He was placed on amiodarone and admitted for further evaluation.  Hospital Course   Consultants: Electrophysiology   Patient ruled out for MI.  He was seen by electrophysiology with recommendation to continue amiodarone and convert to 400 mg BID x 1 wk then 400 mg daily.  Echo showed an EF of 30-35%.  Stress testing was undertaken to r/o new ischemia, and this did not show any inducible ischemia, though multiple areas of scar were noted.  In the absence of ischemia, no further cardiac w/u is planned and he will be discharged home today in good condition. _____________  Discharge Vitals Blood pressure 137/94, pulse 91, temperature 98.8 F (37.1 C), temperature source Oral, resp. rate 18, height '5\' 10"'$  (1.778 m), weight 217 lb 6.4 oz (98.612 kg), SpO2 93 %.  Filed Weights   02/28/16 2134 02/29/16 0500 03/01/16 0607  Weight: 220 lb 4.8 oz (99.927 kg) 219 lb 11.2 oz (99.655 kg) 217 lb 6.4 oz (98.612 kg)    Labs     CBC  Recent Labs  02/28/16 0215 02/28/16 0305 02/29/16 0519  WBC 7.4  --  8.1  NEUTROABS 6.1  --   --  HGB 10.9* 16.3 11.0*  HCT 35.9* 48.0 36.2*  MCV 87.6  --  88.3  PLT 206  --  086   Basic Metabolic Panel  Recent Labs  02/28/16 0215 02/28/16 0305 02/29/16 0519  NA 140 140 139  K 4.2 4.0 4.4  CL 106 104 105  CO2 23  --  27  GLUCOSE 225* 214* 155*  BUN 26* 27* 21*  CREATININE 1.15 1.00 1.15  CALCIUM 9.3  --  9.2  MG 2.2  --   --    Liver Function Tests  Recent Labs  02/28/16 1137  AST 28  ALT 25  ALKPHOS 68  BILITOT 0.7  PROT 6.8  ALBUMIN  3.2*   Cardiac Enzymes  Recent Labs  02/28/16 0215  TROPONINI <0.03   Hemoglobin A1C  Recent Labs  02/28/16 1137  HGBA1C 7.6*   Fasting Lipid Panel  Recent Labs  02/28/16 1137  CHOL 96  HDL 34*  LDLCALC 49  TRIG 65  CHOLHDL 2.8   Thyroid Function Tests  Recent Labs  02/28/16 1136  TSH 2.278    Disposition   Pt is being discharged home today in good condition.  Follow-up Plans & Appointments        Follow-up Information    Follow up with Cristopher Peru, MD On 04/18/2016.   Specialty:  Cardiology   Why:  9:15AM   Contact information:   [place] [address] [phone]       Follow up with [facility] In 2 weeks.   Why:  we will contact you.   Contact information:   [place] [address] [phone]      Follow up with INR follow-up In 3 days.       Discharge Medications     Medication List    STOP taking these medications        furosemide 40 MG tablet  Commonly known as:  LASIX     metoprolol succinate 100 MG 24 hr tablet  Commonly known as:  TOPROL-XL     potassium chloride SA 20 MEQ tablet  Commonly known as:  K-DUR,KLOR-CON      TAKE these medications        amiodarone 400 MG tablet  Commonly known as:  PACERONE  1 tab BID x 1 week and then reduce to 1 tab daily     aspirin EC 81 MG tablet  Take 81 mg by mouth daily.     atorvastatin 80 MG tablet  Commonly known as:  LIPITOR  take 1 tablet by mouth once daily     HYDROcodone-acetaminophen 5-325 MG tablet  Commonly known as:  NORCO  Take 1 tablet by mouth every 4 (four) hours as needed.     lisinopril 2.5 MG tablet  Commonly known as:  PRINIVIL,ZESTRIL  take 1 tablet by mouth once daily     metFORMIN 500 MG tablet  Commonly known as:  GLUCOPHAGE  Take 500 mg by mouth daily.     metoprolol 50 MG tablet  Commonly known as:  LOPRESSOR  Take 1.5 tablets (75 mg total) by mouth 2 (two) times daily.     nitroGLYCERIN 0.4 MG SL  tablet  Commonly known as:  NITROSTAT  Place 1 tablet (0.4 mg total) under the tongue every 5 (five) minutes as needed for chest pain (up to 3 doses).     warfarin 5 MG tablet  Commonly known as:  COUMADIN  Take 1  tablet (5 mg total) by mouth daily at 6 PM.        Outstanding Labs/Studies   None  Duration of Discharge Encounter   Greater than 30 minutes including physician time.  Signed, Murray Hodgkins NP 03/01/2016, 5:32 PM

## 2016-03-01 NOTE — Care Management Important Message (Signed)
Important Message  Patient Details  Name: George Pollard MRN: [id number] Date of Birth: 05-16-1933   Medicare Important Message Given:  Yes    Bethena Roys, RN 03/01/2016, 2:34 PM
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
## 2016-03-02 ENCOUNTER — Telehealth: Payer: Self-pay | Admitting: Physician Assistant

## 2016-03-02 NOTE — Telephone Encounter (Signed)
Received call from patient. He was DC yesterday. He picked up Amiodarone from pharmacy. He is requesting Hydrocodone to be called in to his pharmacy. Reviewed chart.  This was a medication that was prescribed by a different provider prior to admit. I have asked him to call his orthopedist that prescribed this medication. Otherwise he can take Tylenol prn. Richardson Dopp, PA-C   03/02/2016 2:56 PM

## 2016-03-19 ENCOUNTER — Encounter: Payer: Self-pay | Admitting: Orthopedic Surgery

## 2016-03-28 ENCOUNTER — Telehealth: Payer: Self-pay | Admitting: Internal Medicine

## 2016-03-28 NOTE — Telephone Encounter (Signed)
Pt is having weakness and states he can barely walk and is wondering if it's coming from taking amiodarone (PACERONE) 400 MG tablet [[id number]]

## 2016-03-29 NOTE — Telephone Encounter (Addendum)
Called patient back about his amiodarone. Patient states that he feels weak in his legs and he has some general weakness. Patient has a device, but it is not monitor by our clinic at this time. Patient is establishing care with Dr. Lovena Le in [place] at the beginning of May. Since patient is going to be seen in [place], will send message to their office to see if patient can come in for an earlier appt. Patient needs a general cardiologist at [facility] location.

## 2016-03-29 NOTE — Telephone Encounter (Signed)
Will forward to Dr. Koneswaran  

## 2016-03-29 NOTE — Telephone Encounter (Signed)
His primary reason for hospitalization was ventricular tachycardia with ICD shocks for this, for which he was started on amiodarone. He should see EP sooner. Would get him an appt with EP nurse practitioner in [place] within next 10 days if possible.

## 2016-03-29 NOTE — Telephone Encounter (Signed)
There was no answer and voicemail was full on patient's home phone. Patient has a sooner appointment with Dr. Lovena Le on 04/05/16 in [place], but needs to be confirmed. Left message with emergency contact patient's daughter to let patient know he does not have an appointment on Monday, but on Friday with Dr. Lovena Le. There are no EP nurse practitioners appointment available until May. Will try to call patient again on Monday.

## 2016-04-01 ENCOUNTER — Ambulatory Visit: Payer: Medicare Other | Admitting: Cardiovascular Disease

## 2016-04-01 ENCOUNTER — Telehealth: Payer: Self-pay | Admitting: Internal Medicine

## 2016-04-01 NOTE — Telephone Encounter (Signed)
Called patient.  Patient stated that since he had come out of the hospital he had been having leg weakness.  He thought at first that it was associated with him starting amioderone.  Over the weekend he said that his strength was now returning and feels like he's getting back to normal.

## 2016-04-01 NOTE — Telephone Encounter (Signed)
Mr. Burandt is calling because she was having a little problem with his legs and was told to call back this morning at 8am to speak with a nurse . Please call    Thanks

## 2016-04-01 NOTE — Telephone Encounter (Signed)
Patient aware of appointment change  

## 2016-04-05 ENCOUNTER — Other Ambulatory Visit: Payer: Self-pay

## 2016-04-05 ENCOUNTER — Encounter: Payer: Self-pay | Admitting: Internal Medicine

## 2016-04-05 ENCOUNTER — Telehealth: Payer: Self-pay | Admitting: *Deleted

## 2016-04-05 ENCOUNTER — Ambulatory Visit (INDEPENDENT_AMBULATORY_CARE_PROVIDER_SITE_OTHER): Payer: Medicare Other | Admitting: Internal Medicine

## 2016-04-05 VITALS — BP 132/76 | HR 80 | Ht 70.0 in | Wt 220.6 lb

## 2016-04-05 DIAGNOSIS — I255 Ischemic cardiomyopathy: Secondary | ICD-10-CM | POA: Diagnosis not present

## 2016-04-05 LAB — CUP PACEART INCLINIC DEVICE CHECK
Date Time Interrogation Session: 20170421040000
HighPow Impedance: 65 Ohm
HighPow Impedance: 83 Ohm
Implantable Lead Implant Date: 20140123
Implantable Lead Location: 753860
Implantable Lead Model: 293
Implantable Lead Serial Number: 119107
Lead Channel Impedance Value: 425 Ohm
Lead Channel Pacing Threshold Amplitude: 0.8 V
Lead Channel Pacing Threshold Pulse Width: 0.5 ms
Lead Channel Sensing Intrinsic Amplitude: 7.7 mV
Lead Channel Setting Pacing Amplitude: 2 V
Lead Channel Setting Pacing Pulse Width: 0.5 ms
Lead Channel Setting Sensing Sensitivity: 0.6 mV
Pulse Gen Serial Number: 108295

## 2016-04-05 MED ORDER — FUROSEMIDE 40 MG PO TABS: 40.0000 mg | ORAL_TABLET | Freq: Two times a day (BID) | ORAL | Status: DC

## 2016-04-05 NOTE — Progress Notes (Signed)
HPI Mr. George Pollard returns today for followup. He is a pleasant 82 yo man with an ICM, chronic systolic heart failure who presented with VT and ICD shock. He has had some chronic peripheral edema and sob. He has tolerated the lasix. No other complaints. He denies medical noncompliance. No Known Allergies   Current Outpatient Prescriptions  Medication Sig Dispense Refill  . amiodarone (PACERONE) 200 MG tablet Take 1 tablet by mouth 2 (two) times daily.  0  . aspirin EC 81 MG tablet Take 81 mg by mouth daily.    Marland Kitchen atorvastatin (LIPITOR) 80 MG tablet take 1 tablet by mouth once daily 30 tablet 2  . furosemide (LASIX) 40 MG tablet Take 40 mg by mouth daily.  0  . HYDROcodone-acetaminophen (NORCO) 5-325 MG tablet Take 1 tablet by mouth every 4 (four) hours as needed. 10 tablet 0  . lisinopril (PRINIVIL,ZESTRIL) 2.5 MG tablet take 1 tablet by mouth once daily 30 tablet 2  . metFORMIN (GLUCOPHAGE) 500 MG tablet Take 500 mg by mouth daily.   0  . Metoprolol Tartrate (LOPRESSOR) 50 MG tablet Take 1.5 tablets (75 mg total) by mouth 2 (two) times daily. 90 tablet 6  . nitroGLYCERIN (NITROSTAT) 0.4 MG SL tablet Place 1 tablet (0.4 mg total) under the tongue every 5 (five) minutes as needed for chest pain (up to 3 doses). 25 tablet 2  . potassium chloride SA (K-DUR,KLOR-CON) 20 MEQ tablet Take 1 tablet by mouth daily.  0  . warfarin (COUMADIN) 5 MG tablet Take 1 tablet (5 mg total) by mouth daily at 6 PM. 30 tablet 0   No current facility-administered medications for this visit.     Past Medical History  Diagnosis Date  . CAD (coronary artery disease)     a. CABG 1997. b. NSTEMI (troponin >20) s/p PTCA/DES to LCx 10/21/12, overlapping DES to mid & distal RCA 10/26/12; c. 02/2016 MV: EF 37%, large fixed defect - apex, inf, septal, lat wall, no ischemia.  . Atrial fibrillation ([place])   . Hypertension   . BPH (benign prostatic hyperplasia)   . PVD (peripheral vascular disease) ([place])     a. AAA  repair 2001.  Marland Kitchen Systolic CHF ([place])     a. 10/2012: acute on chronic systolic CHF / pulmonary edema secondary to severe mixed cardiomyopathy;  b. 02/2016 Echo: EF 30-35%, sev dil RV, RV dysfxn, massively dil RA, PASP 72mHg.  . Diabetes mellitus ([place]     a. Noted 10/2012 (prior hx of DM resolved with weight loss).  . Ventricular tachycardia ([place]     a. 02/2016 4 ICD shocks for VT-->Amio added.    ROS:   All systems reviewed and negative except as noted in the HPI.   Past Surgical History  Procedure Laterality Date  . Coronary artery bypass graft      1997  . Abdominal aortic aneurysm repair      08/26/2000  . Cataract extraction    . Tonsillectomy    . Left heart catheterization with coronary/graft angiogram N/A 10/21/2012    Procedure: LEFT HEART CATHETERIZATION WITH CBeatrix Fetters  Surgeon: CBurnell Blanks MD;  [facility];  Service: Cardiovascular;  Laterality: N/A;  . Percutaneous coronary stent intervention (pci-s) N/A 10/26/2012    Procedure: PERCUTANEOUS CORONARY STENT INTERVENTION (PCI-S);  Surgeon: MSherren Mocha MD;  [facility];  Service: Cardiovascular;  Laterality: N/A;     Family History  Problem Relation Age of Onset  .  Valvular heart disease Daughter     MVP repair     Social History   Social History  . Marital Status: Married    Spouse Name: N/A  . Number of Children: 4  . Years of Education: N/A   Occupational History  . Not on file.   Social History Main Topics  . Smoking status: Former Smoker -- 20 years  . Smokeless tobacco: Not on file     Comment: Quit 1997  . Alcohol Use: No  . Drug Use: No  . Sexual Activity: Not on file   Other Topics Concern  . Not on file   Social History Narrative   Lives in [place].  Lives with wife.       BP 132/76 mmHg  Pulse 80  Ht '5\' 10"'$  (1.778 m)  Wt 220 lb 9.6 oz (100.064 kg)  BMI 31.65 kg/m2  Physical Exam:  stable appearing 82 yo man, NAD HEENT:  Unremarkable Neck:  7 cm JVD, no thyromegally Lymphatics:  No adenopathy Back:  No CVA tenderness Lungs:  Clear except for basilar rales HEART:  Regular rate rhythm, no murmurs, no rubs, no clicks Abd:  soft, positive bowel sounds, no organomegally, no rebound, no guarding Ext:  2 plus pulses, 2+ edema, no cyanosis, no clubbing Skin:  No rashes no nodules Neuro:  CN II through XII intact, motor grossly intact  EKG - atrial fib with a controlled VR  DEVICE  Normal device function.  See PaceArt for details.   Assess/Plan: 1. Atrial fibrillation - his ventricular rate appears to be well-controlled. He will continue systemic anticoagulation. 2. Ventricular tachycardia - he appears to be tolerating amiodarone. I've recommended that the patient continue amiodarone 400 mg daily for several weeks before decreasing down to 200 mg daily. 3. Chronic systolic heart failure - his symptoms remain class IIb. He will continue his current medications. I strongly encouraged the patient to maintain a low-sodium diet. 4. ICD his [place] single-chamber ICD is working normally. No recurrent ventricular arrhythmias since his visit to the hospital back in March.  Cristopher Peru, M.D.

## 2016-04-05 NOTE — Patient Instructions (Addendum)
Medication Instructions:  Your physician recommends that you continue on your current medications as directed. Please refer to the Current Medication list given to you today.     Labwork: Go to [facility] and have labs   Testing/Procedures: None ordered   Follow-Up: Your physician recommends that you schedule a follow-up appointment in: 3 months with Dr Lovena Le in [place]   Any Other Special Instructions Will Be Listed Below (If Applicable).     If you need a refill on your cardiac medications before your next appointment, please call your pharmacy.

## 2016-04-05 NOTE — Telephone Encounter (Signed)
Patient stated that Dr Lovena Le told him at his visit today that he was going to put him on another medication for his swelling, but he called the pharmacy and they did not have anything for him. I do not see any documentation on this. Please advise. Thanks, MI

## 2016-04-05 NOTE — Telephone Encounter (Signed)
Discussed with Dr Lovena Le and will increase his fluid pill to twice daily and check his BMP as scheduled

## 2016-04-15 ENCOUNTER — Ambulatory Visit (INDEPENDENT_AMBULATORY_CARE_PROVIDER_SITE_OTHER): Payer: Medicare Other | Admitting: Orthopedic Surgery

## 2016-04-15 ENCOUNTER — Ambulatory Visit (INDEPENDENT_AMBULATORY_CARE_PROVIDER_SITE_OTHER): Payer: Medicare Other

## 2016-04-15 VITALS — BP 111/59 | Ht 70.0 in | Wt 221.0 lb

## 2016-04-15 DIAGNOSIS — M5020 Other cervical disc displacement, unspecified cervical region: Secondary | ICD-10-CM | POA: Diagnosis not present

## 2016-04-15 DIAGNOSIS — M792 Neuralgia and neuritis, unspecified: Secondary | ICD-10-CM

## 2016-04-15 NOTE — Progress Notes (Signed)
Chief Complaint  Patient presents with  . Follow-up    Recheck on right shoulder pain.   HPI - Review. 82 year old male received his flu shot and started having right shoulder playing with a blister probably formed at least [place] for a follow-up. We saw him thought he might have some impingement gave him an injection and it didn't help and comes back with continued aching in his right shoulder and right arm and weakness in his right hand. He says he can't hold anything he drops things out of his hand and he has global aching shoulder pain with severe pain at times mild pain at times. He denies any neck pain  ROS  He does not complain of numbness but says it feels like it might be a nerve Weakness noted Denies fever denies chills denies malaise   Past Medical History  Diagnosis Date  . CAD (coronary artery disease)     a. CABG 1997. b. NSTEMI (troponin >20) s/p PTCA/DES to LCx 10/21/12, overlapping DES to mid & distal RCA 10/26/12; c. 02/2016 MV: EF 37%, large fixed defect - apex, inf, septal, lat wall, no ischemia.  . Atrial fibrillation ([place])   . Hypertension   . BPH (benign prostatic hyperplasia)   . PVD (peripheral vascular disease) ([place])     a. AAA repair 2001.  Marland Kitchen Systolic CHF ([place])     a. 10/2012: acute on chronic systolic CHF / pulmonary edema secondary to severe mixed cardiomyopathy;  b. 02/2016 Echo: EF 30-35%, sev dil RV, RV dysfxn, massively dil RA, PASP 3mHg.  . Diabetes mellitus ([place]     a. Noted 10/2012 (prior hx of DM resolved with weight loss).  . Ventricular tachycardia ([place]     a. 02/2016 4 ICD shocks for VT-->Amio added.    Past Surgical History  Procedure Laterality Date  . Coronary artery bypass graft      1997  . Abdominal aortic aneurysm repair      08/26/2000  . Cataract extraction    . Tonsillectomy    . Left heart catheterization with coronary/graft angiogram N/A 10/21/2012    Procedure: LEFT HEART CATHETERIZATION WITH CBeatrix Fetters  Surgeon:  CBurnell Blanks MD;  [facility];  Service: Cardiovascular;  Laterality: N/A;  . Percutaneous coronary stent intervention (pci-s) N/A 10/26/2012    Procedure: PERCUTANEOUS CORONARY STENT INTERVENTION (PCI-S);  Surgeon: MSherren Mocha MD;  [facility];  Service: Cardiovascular;  Laterality: N/A;   Family History  Problem Relation Age of Onset  . Valvular heart disease Daughter     MVP repair   Social History  Substance Use Topics  . Smoking status: Former Smoker -- 20 years  . Smokeless tobacco: Not on file     Comment: Quit 1997  . Alcohol Use: No    Current outpatient prescriptions:  .  amiodarone (PACERONE) 200 MG tablet, Take 1 tablet by mouth 2 (two) times daily., Disp: , Rfl: 0 .  aspirin EC 81 MG tablet, Take 81 mg by mouth daily., Disp: , Rfl:  .  atorvastatin (LIPITOR) 80 MG tablet, take 1 tablet by mouth once daily, Disp: 30 tablet, Rfl: 2 .  furosemide (LASIX) 40 MG tablet, Take 1 tablet (40 mg total) by mouth 2 (two) times daily., Disp: 180 tablet, Rfl: 3 .  HYDROcodone-acetaminophen (NORCO) 5-325 MG tablet, Take 1 tablet by mouth every 4 (four) hours as needed., Disp: 10 tablet, Rfl: 0 .  lisinopril (PRINIVIL,ZESTRIL) 2.5 MG tablet, take 1 tablet  by mouth once daily, Disp: 30 tablet, Rfl: 2 .  metFORMIN (GLUCOPHAGE) 500 MG tablet, Take 500 mg by mouth daily. , Disp: , Rfl: 0 .  Metoprolol Tartrate (LOPRESSOR) 50 MG tablet, Take 1.5 tablets (75 mg total) by mouth 2 (two) times daily., Disp: 90 tablet, Rfl: 6 .  nitroGLYCERIN (NITROSTAT) 0.4 MG SL tablet, Place 1 tablet (0.4 mg total) under the tongue every 5 (five) minutes as needed for chest pain (up to 3 doses)., Disp: 25 tablet, Rfl: 2 .  potassium chloride SA (K-DUR,KLOR-CON) 20 MEQ tablet, Take 1 tablet by mouth daily., Disp: , Rfl: 0 .  warfarin (COUMADIN) 5 MG tablet, Take 1 tablet (5 mg total) by mouth daily at 6 PM., Disp: 30 tablet, Rfl: 0  BP 111/59 mmHg  Ht '5\' 10"'$  (1.778 m)  Wt 221  lb (100.245 kg)  BMI 31.71 kg/m2  Physical Exam  Constitutional: He is oriented to person, place, and time. He appears well-developed and well-nourished. No distress.  Cardiovascular: Normal rate and intact distal pulses.   Neurological: He is alert and oriented to person, place, and time.  Skin: Skin is warm and dry. No rash noted. He is not diaphoretic. No erythema. No pallor.  Psychiatric: He has a normal mood and affect. His behavior is normal. Judgment and thought content normal.    Ortho Exam Cervical spine exam he has some limited range of motion but that's cause of age she has no pain has a negative Spurling sign and no radicular symptoms with maneuvers of the neck  Right shoulder is stable he has good cuff strength normal skin over the right arm good pulses no sensory changes lymph nodes are normal. He has no gait related abnormalities  However his grip strength is very weak on the right normal on the left.  Pulses are otherwise normal  Reflexes are normal  ASSESSMENT: My personal interpretation of the images:  New x-rays of his neck reveal    PLAN He cannot have an MRI but he needs a CT scan of his cervical spine to rule out disc herniation and then if normal we can proceed with nerve conduction study

## 2016-04-15 NOTE — Patient Instructions (Signed)
WE WILL SCHEDULE CT SCAN FOR YOU AND CALL YOU WITH APPT

## 2016-04-16 MED ORDER — TRAMADOL-ACETAMINOPHEN 37.5-325 MG PO TABS: 1.0000 | ORAL_TABLET | Freq: Four times a day (QID) | ORAL | Status: DC | PRN

## 2016-04-18 ENCOUNTER — Encounter: Payer: Medicare Other | Admitting: Internal Medicine

## 2016-04-22 ENCOUNTER — Ambulatory Visit (HOSPITAL_COMMUNITY)
Admission: RE | Admit: 2016-04-22 | Discharge: 2016-04-22 | Disposition: A | Discharge status: Home / Self Care | Payer: Medicare Other | Source: Ambulatory Visit | Attending: Orthopedic Surgery | Admitting: Orthopedic Surgery

## 2016-04-22 ENCOUNTER — Other Ambulatory Visit (HOSPITAL_COMMUNITY)
Admission: RE | Admit: 2016-04-22 | Discharge: 2016-04-22 | Disposition: A | Discharge status: Home / Self Care | Payer: Medicare Other | Source: Ambulatory Visit | Attending: Internal Medicine | Admitting: Internal Medicine

## 2016-04-22 DIAGNOSIS — I255 Ischemic cardiomyopathy: Secondary | ICD-10-CM | POA: Insufficient documentation

## 2016-04-22 DIAGNOSIS — M5020 Other cervical disc displacement, unspecified cervical region: Secondary | ICD-10-CM | POA: Diagnosis present

## 2016-04-22 DIAGNOSIS — C3410 Malignant neoplasm of upper lobe, unspecified bronchus or lung: Secondary | ICD-10-CM | POA: Diagnosis not present

## 2016-04-22 LAB — BASIC METABOLIC PANEL
ANION GAP: 9 (ref 5–15)
BUN: 24 mg/dL — ABNORMAL HIGH (ref 6–20)
CO2: 29 mmol/L (ref 22–32)
Calcium: 9.7 mg/dL (ref 8.9–10.3)
Chloride: 101 mmol/L (ref 101–111)
Creatinine, Ser: 1.18 mg/dL (ref 0.61–1.24)
GFR calc Af Amer: >60 mL/min (ref >60)
GFR, EST NON AFRICAN AMERICAN: 56 mL/min — AB (ref >60)
Glucose, Bld: 152 mg/dL — ABNORMAL HIGH (ref 65–99)
POTASSIUM: 4.2 mmol/L (ref 3.5–5.1)
SODIUM: 139 mmol/L (ref 135–145)

## 2016-04-24 ENCOUNTER — Ambulatory Visit (INDEPENDENT_AMBULATORY_CARE_PROVIDER_SITE_OTHER): Payer: Medicare Other | Admitting: Orthopedic Surgery

## 2016-04-24 VITALS — BP 131/71 | HR 93 | Ht 70.0 in | Wt 221.0 lb

## 2016-04-24 DIAGNOSIS — C3411 Malignant neoplasm of upper lobe, right bronchus or lung: Secondary | ICD-10-CM | POA: Diagnosis not present

## 2016-04-24 DIAGNOSIS — D492 Neoplasm of unspecified behavior of bone, soft tissue, and skin: Secondary | ICD-10-CM

## 2016-04-24 MED ORDER — HYDROCODONE-ACETAMINOPHEN 5-325 MG PO TABS
1.0000 | ORAL_TABLET | Freq: Four times a day (QID) | ORAL | Status: DC | PRN
Start: 2016-04-24 — End: 2016-04-30

## 2016-04-24 NOTE — Progress Notes (Signed)
82 year old male presented with shoulder pain which started after he got a flu shot after an injection and persistent pain we eventually got a CT of the cervical spine and it showed a Pancoast tumor  Review of systems he complains of weakness in the right hand  Past Medical History  Diagnosis Date  . CAD (coronary artery disease)     a. CABG 1997. b. NSTEMI (troponin >20) s/p PTCA/DES to LCx 10/21/12, overlapping DES to mid & distal RCA 10/26/12; c. 02/2016 MV: EF 37%, large fixed defect - apex, inf, septal, lat wall, no ischemia.  . Atrial fibrillation ([place])   . Hypertension   . BPH (benign prostatic hyperplasia)   . PVD (peripheral vascular disease) ([employer])     a. AAA repair 2001.  Marland Kitchen Systolic CHF ([place])     a. 10/2012: acute on chronic systolic CHF / pulmonary edema secondary to severe mixed cardiomyopathy;  b. 02/2016 Echo: EF 30-35%, sev dil RV, RV dysfxn, massively dil RA, PASP 71mHg.  . Diabetes mellitus ([place]     a. Noted 10/2012 (prior hx of DM resolved with weight loss).  . Ventricular tachycardia ([place]     a. 02/2016 4 ICD shocks for VT-->Amio added.    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 The inferior extent of the tumor is not apparent on this cervical CT scan. The mass extends into the mediastinum and deviates the trachea anteriorly and deviates the esophagus to the left. Fat planes between the mass and the trachea and the esophagus are obliterated.   The tumor invades the C7, T1 and T2 vertebra with destruction portions of those vertebra. There is an area of lucency in the posterior were cortex is C6 which may also represent de tumor extension. Tumor extends into the right neural foramen at C7-T1 and at T1-2 and extends into the spinal canal at T1-2.   There is tumor invasion of the posterior  medial aspects of the right first and second ribs with destruction of a portion of the right transverse process of T1.   There is abnormal soft tissue and some bone destruction in left mastoid air cells with disruption of the cortex just posterior to the external auditory canal. I suspect this represents metastatic disease. The left internal and external auditory canals and middle ear cavity are clear.   There is degenerative disc disease primarily at C5-6 through T1-2 with bilateral foraminal stenosis at C5-6 and right foraminal stenosis at C6-7.   No supraclavicular adenopathy.   IMPRESSION: 1. 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 2. Possible metastatic lesion in the left mastoid air cells. This is less likely a cholesteatoma.     Electronically Signed   By: JLorriane ShireM.D.   On: 04/22/2016 13:17  I reviewed the nature of this problem with him and the seriousness of it and that he see a surgeon as soon as possible. [facility] try to get an appointment for him as soon as possible

## 2016-04-25 ENCOUNTER — Other Ambulatory Visit: Payer: Self-pay | Admitting: *Deleted

## 2016-04-25 ENCOUNTER — Telehealth: Payer: Self-pay | Admitting: Orthopedic Surgery

## 2016-04-25 DIAGNOSIS — C3411 Malignant neoplasm of upper lobe, right bronchus or lung: Secondary | ICD-10-CM

## 2016-04-25 NOTE — Telephone Encounter (Signed)
Call from Dr Hewitt Shorts office, [facility], Caren Griffins - states patient is with Dr Cyndy Freeze now -- has an appointment been made as of yet with oncologist?  Please advise. (Their office [phone]) * I spoke with Baldomero Lamy, LPN, who was just made aware via staff message from Dr Aline Brochure for our office to schedule the appointment with oncologist, Dr Whitney Muse.  DR CABBELL came back on line, and I relayed that this is in progress.  Patient will remain with Dr Christella Noa awaiting the appointment.  Call back to DIRECT PH# [phone].

## 2016-04-25 NOTE — Telephone Encounter (Signed)
04/25/16 continued 11:05am - Received call back from oncologist Dr Ancil Linsey, [facility], with appointment for tomorrow, 04/26/16, 2:00pm; called back to Dr. Cyndy Freeze, at direct Ph# provided, [phone] and per Caren Griffins, relayed this appointment information to give to patient while at their office.  Also called back to patient; appointment confirmed.

## 2016-04-26 ENCOUNTER — Encounter (HOSPITAL_COMMUNITY): Payer: Medicare Other | Attending: Hematology & Oncology | Admitting: Hematology & Oncology

## 2016-04-26 ENCOUNTER — Encounter (HOSPITAL_COMMUNITY): Payer: Self-pay | Admitting: Hematology & Oncology

## 2016-04-26 ENCOUNTER — Other Ambulatory Visit (HOSPITAL_COMMUNITY): Payer: Self-pay | Admitting: Neurosurgery

## 2016-04-26 VITALS — BP 122/70 | HR 79 | Temp 97.6°F | Resp 20 | Ht 68.0 in | Wt 215.9 lb

## 2016-04-26 DIAGNOSIS — R531 Weakness: Secondary | ICD-10-CM

## 2016-04-26 DIAGNOSIS — M25511 Pain in right shoulder: Secondary | ICD-10-CM

## 2016-04-26 DIAGNOSIS — C7951 Secondary malignant neoplasm of bone: Secondary | ICD-10-CM | POA: Diagnosis not present

## 2016-04-26 DIAGNOSIS — C3411 Malignant neoplasm of upper lobe, right bronchus or lung: Secondary | ICD-10-CM | POA: Diagnosis not present

## 2016-04-26 DIAGNOSIS — Z87891 Personal history of nicotine dependence: Secondary | ICD-10-CM

## 2016-04-26 DIAGNOSIS — C7949 Secondary malignant neoplasm of other parts of nervous system: Secondary | ICD-10-CM | POA: Diagnosis not present

## 2016-04-26 DIAGNOSIS — R29898 Other symptoms and signs involving the musculoskeletal system: Secondary | ICD-10-CM

## 2016-04-26 MED ORDER — DEXAMETHASONE 2 MG PO TABS: 2.0000 mg | ORAL_TABLET | Freq: Two times a day (BID) | ORAL | Status: DC

## 2016-04-26 NOTE — Progress Notes (Signed)
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  Consistent Right shoulder pain for the last 6 months  HISTORY OF PRESENTING ILLNESS:  George Pollard 82 y.o. male is here because of referral from Dr. Aline Pollard for abnormal CT scan results showing a R pancoast tumor.   George Pollard is accompanied by his wife and daughter. I personally reviewed and went over imaging studies with the patient.  He met with Dr. Christella Pollard of neurosurgery yesterday. He notes he was told that there was "nothing neurosurgery could do to help him with his arm/hand weakness."   He was pretty active until the right shoulder pain began 6 months ago after receiving a flu shot and pneumonia shot. Notes after these two shots his shoulder became swollen. This pain has been consistent over the past 6 months and radiates down his right arm causing weakness of the right arm. 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 He went to his PCP when he first starting having the pain. His PCP believed it was arthritis. He was told to continue taking Tylenol to manage the pain. This did not help so he went to the ER of [facility] where X-rays were performed and was referred to Dr. Aline Pollard who gave him a shot in his shoulder. This shot did not alleviate the pain.   Reports weakness in his legs, causing him to tremble. He explains this weakness may be from inactivity as it has gotten worse over time. One episode of falling while gardening. He explains it was not so much a fall but rather his legs became weak while working in the yard and he sat down quickly.  His defibrillator went off 4 times the night of 02/28/2016. He was  admitted to [facility] from 3/15 - 3/17. He was started on amiodarone. Defibrillator fired appropriately for V-tach. He notes that one of the side effects of amiodarone is leg weakness and he wonders if this is what caused his leg weakness.  Denies weight loss. Denies pain upon palpation of supraclavicular region. While raising his arms above his head, he feels pain in the muscle of his right shoulder/axilla area.   His wife notes he has bad circulation in his legs. Patient reports he occasionally has fluid collection in his left leg, wearing compression socks to resolve this.   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 He has CT imaging ordered for Monday of the C/A/P.     MEDICAL HISTORY:  Past Medical History  Diagnosis Date  . CAD (coronary artery disease)     a. CABG 1997. b. NSTEMI (troponin >20) s/p PTCA/DES to LCx 10/21/12, overlapping DES to mid & distal RCA 10/26/12; c. 02/2016 MV: EF 37%, large fixed defect - apex, inf, septal, lat wall, no ischemia.  . Atrial fibrillation ([place])   . Hypertension   . BPH (benign prostatic hyperplasia)   . PVD (peripheral vascular disease) ([place])     a. AAA repair 2001.  Marland Kitchen Systolic CHF ([place])     a. 10/2012: acute on chronic systolic CHF / pulmonary edema  secondary to severe mixed cardiomyopathy;  b. 02/2016 Echo: EF 30-35%, sev dil RV, RV dysfxn, massively dil RA, PASP 58mHg.  . Diabetes mellitus ([place]     a. Noted 10/2012 (prior hx of DM resolved with weight loss).  . Ventricular tachycardia ([place]     a. 02/2016 4 ICD shocks for VT-->Amio added.    SURGICAL HISTORY: Past Surgical History  Procedure Laterality Date  . Coronary artery bypass graft      1997  . Abdominal aortic aneurysm repair      08/26/2000  . Cataract extraction    . Tonsillectomy    . Left heart catheterization with coronary/graft angiogram N/A 10/21/2012     Procedure: LEFT HEART CATHETERIZATION WITH CBeatrix Fetters  Surgeon: CBurnell Blanks MD;  [facility];  Service: Cardiovascular;  Laterality: N/A;  . Percutaneous coronary stent intervention (pci-s) N/A 10/26/2012    Procedure: PERCUTANEOUS CORONARY STENT INTERVENTION (PCI-S);  Surgeon: MSherren Mocha MD;  [facility];  Service: Cardiovascular;  Laterality: N/A;    SOCIAL HISTORY: Social History   Social History  . Marital Status: Married    Spouse Name: N/A  . Number of Children: 4  . Years of Education: N/A   Occupational History  . Not on file.   Social History Main Topics  . Smoking status: Former Smoker -- 20 years  . Smokeless tobacco: Not on file     Comment: Quit 1997  . Alcohol Use: No  . Drug Use: No  . Sexual Activity: Not on file   Other Topics Concern  . Not on file   Social History Narrative   [place]  Lives with wife.     Married 686years 4 children 8 grandchildren 153great-grandchildren Ex smoker, quit in 1997. He was a heavy smoker, at least 2 ppd. Started at [age]yo. 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   FAMILY HISTORY: Family History  Problem Relation Age of Onset  . Valvular heart disease Daughter     MVP repair   Father died at 752yo. Received artifical knee replacement and fell after which broke it up. He had it replaced then fell again. At this point he just sat and gained weight. Alzheimer's. Had an aneurysm. Mother died at 862yo. She had a heart attack in her 472s Congestive heart failure. 2 brothers and 2 sisters total siblings. All 4 siblings have had aneurysms. 3 had surgeries, and 1 was too unhealthy for surgery. All were smokers.  2 brothers are still living.  ALLERGIES:  has No Known Allergies.  MEDICATIONS:  Current Outpatient Prescriptions  Medication Sig  Dispense Refill  . amiodarone (PACERONE) 200 MG tablet Take 1 tablet by mouth 2 (two) times daily.  0  . aspirin EC 81 MG tablet Take 81 mg by mouth daily.    .Marland Kitchenatorvastatin (LIPITOR) 80 MG tablet take 1 tablet by mouth once daily 30 tablet 2  . furosemide (LASIX) 40 MG tablet Take 1 tablet (40 mg total) by mouth 2 (two) times daily. 180 tablet 3  . lisinopril (PRINIVIL,ZESTRIL) 2.5 MG tablet take 1 tablet by mouth once daily 30 tablet 2  . metFORMIN (GLUCOPHAGE) 500 MG tablet Take 500 mg by mouth daily.   0  . Metoprolol Tartrate (LOPRESSOR) 50 MG tablet Take 1.5 tablets (75 mg total) by  mouth 2 (two) times daily. 90 tablet 6  . nitroGLYCERIN (NITROSTAT) 0.4 MG SL tablet Place 1 tablet (0.4 mg total) under the tongue every 5 (five) minutes as needed for chest pain (up to 3 doses). 25 tablet 2  . potassium chloride SA (K-DUR,KLOR-CON) 20 MEQ tablet Take 1 tablet by mouth daily.  0  . warfarin (COUMADIN) 5 MG tablet Take 1 tablet (5 mg total) by mouth daily at 6 PM. 30 tablet 0  . HYDROcodone-acetaminophen (NORCO/VICODIN) 5-325 MG tablet Take 1 tablet by mouth every 6 (six) hours as needed for moderate pain. (Patient not taking: Reported on 04/26/2016) 56 tablet 0  . oxyCODONE-acetaminophen (PERCOCET) 7.5-325 MG tablet take 1 tablet by mouth every 6 hours if needed for pain  0  . traMADol-acetaminophen (ULTRACET) 37.5-325 MG tablet Take 1 tablet by mouth every 6 (six) hours as needed. (Patient not taking: Reported on 04/24/2016) 30 tablet 0   No current facility-administered medications for this visit.    Review of Systems  Constitutional: Positive for malaise/fatigue. Negative for weight loss.  HENT: Negative.   Eyes: Negative.   Respiratory: Negative.   Cardiovascular: Positive for leg swelling.       Occasional left leg swelling, managed with compression socks.  Gastrointestinal: Negative.   Genitourinary: Negative.   Musculoskeletal:       Consistent Right shoulder pain for the last 6  months.  Skin: Negative.   Neurological: Positive for focal weakness and weakness.       Right arm and bilateral leg weakness.  Endo/Heme/Allergies: Negative.   Psychiatric/Behavioral: Negative.   All other systems reviewed and are negative.  14 point ROS was done and is otherwise as detailed above or in HPI   PHYSICAL EXAMINATION: ECOG PERFORMANCE STATUS: 1 - Symptomatic but completely ambulatory  Filed Vitals:   04/26/16 1410  BP: 122/70  Pulse: 79  Temp: 97.6 F (36.4 C)  Resp: 20   Filed Weights   04/26/16 1410  Weight: 215 lb 14.4 oz (97.932 kg)    Physical Exam  Constitutional: He is oriented to person, place, and time and well-developed, well-nourished, and in no distress.  HENT:  Head: Normocephalic and atraumatic.  Mouth/Throat: Oropharynx is clear and moist.  Eyes: Conjunctivae and EOM are normal. Pupils are equal, round, and reactive to light. Right eye exhibits no discharge. Left eye exhibits no discharge. No scleral icterus.  Neck: Normal range of motion. Neck supple. No JVD present. No tracheal deviation present. No thyromegaly present.  Cardiovascular: Normal rate, regular rhythm and normal heart sounds.   Pulmonary/Chest: Effort normal and breath sounds normal. No respiratory distress. He has no wheezes.  Abdominal: Soft. Bowel sounds are normal. He exhibits no distension and no mass. There is no tenderness. There is no rebound and no guarding.  Musculoskeletal: Normal range of motion. He exhibits no edema.  Compression hose LE bilaterally, muscle atrophy R scapular region  Lymphadenopathy:    He has no cervical adenopathy.  Neurological: He is alert and oriented to person, place, and time. He displays normal reflexes. No cranial nerve deficit. Gait normal. Coordination normal.  Right sided weakness. Weak hip flexors.  Skin: Skin is warm and dry.  Psychiatric: Memory, affect and judgment normal.  Nursing note and vitals reviewed.   LABORATORY DATA:  I  have reviewed the data as listed Lab Results  Component Value Date   WBC 8.1 02/29/2016   HGB 11.0* 02/29/2016   HCT 36.2* 02/29/2016   MCV 88.3 02/29/2016  PLT 184 02/29/2016   CMP     Component Value Date/Time   NA 139 04/22/2016 0846   K 4.2 04/22/2016 0846   CL 101 04/22/2016 0846   CO2 29 04/22/2016 0846   GLUCOSE 152* 04/22/2016 0846   BUN 24* 04/22/2016 0846   CREATININE 1.18 04/22/2016 0846   CALCIUM 9.7 04/22/2016 0846   PROT 6.8 02/28/2016 1137   ALBUMIN 3.2* 02/28/2016 1137   AST 28 02/28/2016 1137   ALT 25 02/28/2016 1137   ALKPHOS 68 02/28/2016 1137   BILITOT 0.7 02/28/2016 1137   GFRNONAA 56* 04/22/2016 0846   GFRAA >60 04/22/2016 0846     RADIOGRAPHIC STUDIES: I have personally reviewed the radiological images as listed and agreed with the findings in the report. No results found. Study Result     CLINICAL DATA: Neck pain for 6 months. Right shoulder pain for 2 weeks.  EXAM: CT CERVICAL SPINE WITHOUT CONTRAST  TECHNIQUE: Multidetector CT imaging of the cervical spine was performed without intravenous contrast. Multiplanar CT image reconstructions were also generated.  COMPARISON: Radiographs dated 04/15/2016  FINDINGS: There is a Pancoast tumor at the right lung apex medially measuring approximately 5.5 x 3.6 cm in the axial plane. The inferior extent of the tumor is not apparent on this cervical CT scan. The mass extends into the mediastinum and deviates the trachea anteriorly and deviates the esophagus to the left. Fat planes between the mass and the trachea and the esophagus are obliterated.  The tumor invades the C7, T1 and T2 vertebra with destruction portions of those vertebra. There is an area of lucency in the posterior were cortex is C6 which may also represent de tumor extension. Tumor extends into the right neural foramen at C7-T1 and at T1-2 and extends into the spinal canal at T1-2.  There is tumor invasion of the  posterior medial aspects of the right first and second ribs with destruction of a portion of the right transverse process of T1.  There is abnormal soft tissue and some bone destruction in left mastoid air cells with disruption of the cortex just posterior to the external auditory canal. I suspect this represents metastatic disease. The left internal and external auditory canals and middle ear cavity are clear.  There is degenerative disc disease primarily at C5-6 through T1-2 with bilateral foraminal stenosis at C5-6 and right foraminal stenosis at C6-7.  No supraclavicular adenopathy.  IMPRESSION: 1. 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 2. Possible metastatic lesion in the left mastoid air cells. This is less likely a cholesteatoma.   Electronically Signed  By: Lorriane Shire M.D.  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  Consistent Right shoulder pain for the last 6 months R arm/hand weakness History of tobacco use   I have recommended the following:  1) Case discussed with Dr. Tammi Klippel of Radiation Oncology, XRT referral 2) Referral to CT surgery for biopsy to establish histologic diagnosis prior to concurrent therapy 3) CT imaging as ordered on Monday 4) Patient met with Hildred Alamin our patient navigator today 5) Low dose dexamethasone 6) follow-up with [place] post biopsy  All questions were  answered. The patient knows to call the clinic with any problems, questions or concerns.  This document serves as a record of services personally performed by Ancil Linsey, MD. It was created on her behalf by Arlyce Harman, a trained  medical scribe. The creation of this record is based on the scribe's personal observations and the provider's statements to them. This document has been checked and approved by the attending provider.  I have reviewed the above documentation for accuracy and completeness, and I agree with the above.  This note was electronically signed.    Molli Hazard, MD  04/26/2016 3:39 PM

## 2016-04-29 ENCOUNTER — Other Ambulatory Visit (HOSPITAL_COMMUNITY): Payer: Self-pay | Admitting: Psychiatry

## 2016-04-29 ENCOUNTER — Other Ambulatory Visit: Payer: Self-pay | Admitting: *Deleted

## 2016-04-29 ENCOUNTER — Ambulatory Visit (HOSPITAL_COMMUNITY)
Admission: RE | Admit: 2016-04-29 | Discharge: 2016-04-29 | Disposition: A | Discharge status: Home / Self Care | Payer: Medicare Other | Source: Ambulatory Visit | Attending: Neurosurgery | Admitting: Neurosurgery

## 2016-04-29 DIAGNOSIS — C3411 Malignant neoplasm of upper lobe, right bronchus or lung: Secondary | ICD-10-CM

## 2016-04-29 DIAGNOSIS — R911 Solitary pulmonary nodule: Secondary | ICD-10-CM | POA: Diagnosis not present

## 2016-04-29 DIAGNOSIS — E279 Disorder of adrenal gland, unspecified: Secondary | ICD-10-CM | POA: Insufficient documentation

## 2016-04-29 DIAGNOSIS — Z1231 Encounter for screening mammogram for malignant neoplasm of breast: Secondary | ICD-10-CM

## 2016-04-29 MED ORDER — IOPAMIDOL (ISOVUE-300) INJECTION 61%
100.0000 mL | Freq: Once | INTRAVENOUS | Status: AC | PRN
  Administered 2016-04-29: 100 mL via INTRAVENOUS

## 2016-04-30 ENCOUNTER — Encounter (HOSPITAL_COMMUNITY): Payer: Self-pay | Admitting: Hematology & Oncology

## 2016-04-30 ENCOUNTER — Institutional Professional Consult (permissible substitution) (INDEPENDENT_AMBULATORY_CARE_PROVIDER_SITE_OTHER): Payer: Medicare Other | Admitting: Cardiothoracic Surgery

## 2016-04-30 ENCOUNTER — Encounter: Payer: Self-pay | Admitting: Cardiothoracic Surgery

## 2016-04-30 ENCOUNTER — Other Ambulatory Visit: Payer: Self-pay | Admitting: *Deleted

## 2016-04-30 VITALS — BP 131/78 | HR 82 | Resp 16 | Ht 70.0 in | Wt 215.0 lb

## 2016-04-30 DIAGNOSIS — R918 Other nonspecific abnormal finding of lung field: Secondary | ICD-10-CM | POA: Diagnosis not present

## 2016-04-30 DIAGNOSIS — R911 Solitary pulmonary nodule: Secondary | ICD-10-CM

## 2016-04-30 DIAGNOSIS — C3411 Malignant neoplasm of upper lobe, right bronchus or lung: Secondary | ICD-10-CM

## 2016-04-30 NOTE — Progress Notes (Signed)
PCP is Monico Blitz, MD Referring Provider is Monico Blitz, MD  Chief Complaint  Patient presents with  . Lung Mass    CT CHEST/A/P  04/29/16  . Lung Lesion  patient examined, chest CT scan performed yesterday and 2-D echocardiogram performed March 2017 personally reviewed and counseled with patient   HPI:82 year old Caucasian male reformed smoker presents with recent diagnosed right upper lobe mass with invasion into the posterior mediastinum and destruction of elements of T1 and C7-C6 vertebral bodies-Pancoast tumor. The patient has had right shoulder and arm pain and right arm weakness for several months. He has had shoulder injections without improvement. He denies weight loss , headache or change in vision, hemoptysis, or abdominal pain.  The patient recently had CT scan of the neck followed by CT scan of the chest showing a 6 cm mass at the right lung apex extending posteriorly against the trachea esophagus and vertebral bodies of C6-T1. Pain has had significant pain has been on narcotics. The patient presents for discussion of biopsy to establish tissue diagnosis before radiation therapy, probably palliative, can be initiated. The patient states he can no longer use his right arm [dominant] to feed himself.  Patient has a long history cardiac disease status post CABG 20 years ago. He now has ejection fraction of 30-35% with stents placed in 2014. The patient has chronic atrial fibrillation and has been on Coumadin for several years. He stopped his Coumadin 3 days ago. He denies stroke from his atrial fib Past Medical History  Diagnosis Date  . CAD (coronary artery disease)     a. CABG 1997. b. NSTEMI (troponin >20) s/p PTCA/DES to LCx 10/21/12, overlapping DES to mid & distal RCA 10/26/12; c. 02/2016 MV: EF 37%, large fixed defect - apex, inf, septal, lat wall, no ischemia.  . Atrial fibrillation ([place])   . Hypertension   . BPH (benign prostatic hyperplasia)   . PVD (peripheral vascular  disease) ([place])     a. AAA repair 2001.  Marland Kitchen Systolic CHF ([place])     a. 10/2012: acute on chronic systolic CHF / pulmonary edema secondary to severe mixed cardiomyopathy;  b. 02/2016 Echo: EF 30-35%, sev dil RV, RV dysfxn, massively dil RA, PASP 64mHg.  . Diabetes mellitus ([place]     a. Noted 10/2012 (prior hx of DM resolved with weight loss).  . Ventricular tachycardia ([place]     a. 02/2016 4 ICD shocks for VT-->Amio added.    Past Surgical History  Procedure Laterality Date  . Coronary artery bypass graft      1997  . Abdominal aortic aneurysm repair      08/26/2000  . Cataract extraction    . Tonsillectomy    . Left heart catheterization with coronary/graft angiogram N/A 10/21/2012    Procedure: LEFT HEART CATHETERIZATION WITH CBeatrix Fetters  Surgeon: CBurnell Blanks MD;  [facility];  Service: Cardiovascular;  Laterality: N/A;  . Percutaneous coronary stent intervention (pci-s) N/A 10/26/2012    Procedure: PERCUTANEOUS CORONARY STENT INTERVENTION (PCI-S);  Surgeon: MSherren Mocha MD;  [facility];  Service: Cardiovascular;  Laterality: N/A;    Family History  Problem Relation Age of Onset  . Valvular heart disease Daughter     MVP repair    Social History Social History  Substance Use Topics  . Smoking status: Former Smoker -- 2.00 packs/day for 20 years    Types: Cigarettes  . Smokeless tobacco: Never Used     Comment: Quit 1997  .  Alcohol Use: No    Current Outpatient Prescriptions  Medication Sig Dispense Refill  . amiodarone (PACERONE) 200 MG tablet Take 1 tablet by mouth 2 (two) times daily.  0  . aspirin EC 81 MG tablet Take 81 mg by mouth daily.    Marland Kitchen atorvastatin (LIPITOR) 80 MG tablet take 1 tablet by mouth once daily 30 tablet 2  . dexamethasone (DECADRON) 2 MG tablet Take 1 tablet (2 mg total) by mouth 2 (two) times daily with a meal. 30 tablet 0  . furosemide (LASIX) 40 MG tablet Take 1 tablet (40 mg total) by mouth 2 (two)  times daily. 180 tablet 3  . lisinopril (PRINIVIL,ZESTRIL) 2.5 MG tablet take 1 tablet by mouth once daily 30 tablet 2  . metFORMIN (GLUCOPHAGE) 500 MG tablet Take 500 mg by mouth daily.   0  . Metoprolol Tartrate (LOPRESSOR) 50 MG tablet Take 1.5 tablets (75 mg total) by mouth 2 (two) times daily. 90 tablet 6  . nitroGLYCERIN (NITROSTAT) 0.4 MG SL tablet Place 1 tablet (0.4 mg total) under the tongue every 5 (five) minutes as needed for chest pain (up to 3 doses). 25 tablet 2  . oxyCODONE-acetaminophen (PERCOCET) 7.5-325 MG tablet take 1 tablet by mouth every 6 hours if needed for pain  0  . potassium chloride SA (K-DUR,KLOR-CON) 20 MEQ tablet Take 1 tablet by mouth daily.  0  . warfarin (COUMADIN) 5 MG tablet Take 1 tablet (5 mg total) by mouth daily at 6 PM. (Patient not taking: Reported on 04/30/2016) 30 tablet 0   No current facility-administered medications for this visit.    No Known Allergies  Review of Systems        Review of Systems :  [ y ] = yes, [  ] = no        General :  Weight gain [   ]    Weight loss  [ Yes  ]  Fatigue [  ]  Fever [  ]  Chills  [  ]                                Weakness  [ yes right upper extremity ]           HEENT    Headache [  ]  Dizziness [  ]  Blurred vision [  ] Glaucoma  [  ]                          Nosebleeds [  ] Painful or loose teeth [  ]history of cataract surgery bilaterally        Cardiac :  Chest pain/ pressure [place]  ]  Resting SOB [  ] exertional SOB [  ]                        Orthopnea [  ]  Pedal edema  [  ]  Palpitations   Yes status post AICD] Syncope/presyncope '[ ]'$                         Paroxysmal nocturnal dyspnea [  ]         Pulmonary : cough [  ]  wheezing [  ]  Hemoptysis [  ] Sputum [  ] Snoring [  ]  Pneumothorax [  ]  Sleep apnea [  ]        GI : Vomiting [  ]  Dysphagia [ mild ]  Melena  [  ]  Abdominal pain [  ] BRBPR [  ]              Heart burn [  ]  Constipation [  ] Diarrhea  [  ]  Colonoscopy [   ]        GU : Hematuria [  ]  Dysuria [  ]  Nocturia [  ] UTI's [  ]        Vascular : Claudication [  ]  Rest pain [  ]  DVT [  ] Vein stripping [  ] leg ulcers [  ]                          TIA [  ] Stroke [  ]  Varicose veins [  ]history of AAA repair        NEURO :  Headaches  [  ] Seizures [  ] Vision changes [  ] Paresthesias [  ]                                       Seizures [  ]weakness and numbness in right upper extremity for several months        Musculoskeletal :  Arthritis [  ] Gout  [  ]  Back pain [place]  ]  Joint pain [  ]        Skin :  Rash [  ]  Melanoma [  ] Sores [  ]        Heme : Bleeding problems [yes on chronic Coumadin  ]Clotting Disorders [  ] Anemia [  ]Blood Transfusion '[ ]'$         Endocrine : Diabetes [adult onset  ] Heat or Cold intolerance [  ] Polyuria [  ]excessive thirst '[ ]'$         Psych : Depression [  ]  Anxiety [  ]  Psych hospitalizations [  ] Memory change [  ]                                               BP 131/78 mmHg  Pulse 82  Resp 16  Ht '5\' 10"'$  (1.778 m)  Wt 215 lb (97.523 kg)  BMI 30.85 kg/m2  SpO2 96% Physical Exam      Physical Exam  General: very friendly elderly Caucasian male no acute distress accompanied by family HEENT: Normocephalic pupils equal , dentition with full plates Neck: Supple without JVD, adenopathy, or bruit, tenderness in right neck without palpable mass Chest: Clear to auscultation, symmetrical breath sounds, no rhonchi, no tenderness             or deformity Cardiovascular: regular rate, no murmur, no gallop, peripheral pulses             palpable in all extremities Abdomen:  Soft, nontender, no palpable mass or organomegaly. Well-healed abdominal incision Extremities: Warm, well-perfused, no clubbing cyanosis or tenderness,mild bilateral pedal edema  no venous stasis changes of the legs Rectal/GU: Deferred Neuro: very weak right hand grip, unable to elevate right arm above  shoulder Skin: Clean and dry without rash or ulceration   Diagnostic Tests: CT scan of chest, CT scan of abdomen, recent echocardiogram all personally reviewed  Impression: 6 cm and posterior right lung apex with in vision into the mediastinum against the posterior trachea esophagus and with destruction of vertebral bodies of C6 C7-T1  Plan:biopsy to obtain tissue for diagnosis, to be performed at [place] may 19. We'll plan bronchoscopy, mediastinoscopy, possible EBUS. Procedure indications benefits discussed. They understand general anesthesia will be used. We will plan on keeping the patient 24-hour observation status. Risk of bleeding is the major risk of requiring open thoracotomy. He will continue to old his Coumadin until after surgery.  Len Childs, MD [facility] [phone]

## 2016-05-02 ENCOUNTER — Encounter (HOSPITAL_COMMUNITY)
Admission: RE | Admit: 2016-05-02 | Discharge: 2016-05-02 | Disposition: A | Discharge status: Home / Self Care | Payer: Medicare Other | Source: Ambulatory Visit | Attending: Cardiothoracic Surgery | Admitting: Cardiothoracic Surgery

## 2016-05-02 ENCOUNTER — Encounter (HOSPITAL_COMMUNITY): Payer: Self-pay

## 2016-05-02 VITALS — BP 132/81 | HR 89 | Temp 97.6°F | Resp 18 | Ht 70.0 in | Wt 213.0 lb

## 2016-05-02 DIAGNOSIS — Z7982 Long term (current) use of aspirin: Secondary | ICD-10-CM | POA: Diagnosis not present

## 2016-05-02 DIAGNOSIS — I509 Heart failure, unspecified: Secondary | ICD-10-CM | POA: Diagnosis not present

## 2016-05-02 DIAGNOSIS — Z79899 Other long term (current) drug therapy: Secondary | ICD-10-CM | POA: Diagnosis not present

## 2016-05-02 DIAGNOSIS — C3411 Malignant neoplasm of upper lobe, right bronchus or lung: Secondary | ICD-10-CM | POA: Diagnosis present

## 2016-05-02 DIAGNOSIS — I110 Hypertensive heart disease with heart failure: Secondary | ICD-10-CM | POA: Diagnosis not present

## 2016-05-02 DIAGNOSIS — Z7984 Long term (current) use of oral hypoglycemic drugs: Secondary | ICD-10-CM | POA: Diagnosis not present

## 2016-05-02 DIAGNOSIS — I255 Ischemic cardiomyopathy: Secondary | ICD-10-CM | POA: Diagnosis not present

## 2016-05-02 DIAGNOSIS — I482 Chronic atrial fibrillation: Secondary | ICD-10-CM | POA: Diagnosis not present

## 2016-05-02 DIAGNOSIS — I252 Old myocardial infarction: Secondary | ICD-10-CM | POA: Diagnosis not present

## 2016-05-02 DIAGNOSIS — E1151 Type 2 diabetes mellitus with diabetic peripheral angiopathy without gangrene: Secondary | ICD-10-CM | POA: Diagnosis not present

## 2016-05-02 DIAGNOSIS — Z87891 Personal history of nicotine dependence: Secondary | ICD-10-CM | POA: Diagnosis not present

## 2016-05-02 DIAGNOSIS — I2510 Atherosclerotic heart disease of native coronary artery without angina pectoris: Secondary | ICD-10-CM | POA: Diagnosis not present

## 2016-05-02 DIAGNOSIS — Z951 Presence of aortocoronary bypass graft: Secondary | ICD-10-CM | POA: Diagnosis not present

## 2016-05-02 DIAGNOSIS — I472 Ventricular tachycardia: Secondary | ICD-10-CM | POA: Diagnosis not present

## 2016-05-02 DIAGNOSIS — M25511 Pain in right shoulder: Secondary | ICD-10-CM | POA: Diagnosis not present

## 2016-05-02 DIAGNOSIS — Z7901 Long term (current) use of anticoagulants: Secondary | ICD-10-CM | POA: Diagnosis not present

## 2016-05-02 HISTORY — DX: Reserved for inherently not codable concepts without codable children: IMO0001

## 2016-05-02 HISTORY — DX: Anxiety disorder, unspecified: F41.9

## 2016-05-02 HISTORY — DX: Presence of automatic (implantable) cardiac defibrillator: Z95.810

## 2016-05-02 LAB — COMPREHENSIVE METABOLIC PANEL
ALT: 25 U/L (ref 17–63)
AST: 27 U/L (ref 15–41)
Albumin: 3.3 g/dL — ABNORMAL LOW (ref 3.5–5.0)
Alkaline Phosphatase: 94 U/L (ref 38–126)
Anion gap: 10 (ref 5–15)
BUN: 35 mg/dL — ABNORMAL HIGH (ref 6–20)
CO2: 32 mmol/L (ref 22–32)
Calcium: 9.5 mg/dL (ref 8.9–10.3)
Chloride: 96 mmol/L — ABNORMAL LOW (ref 101–111)
Creatinine, Ser: 1.44 mg/dL — ABNORMAL HIGH (ref 0.61–1.24)
GFR calc Af Amer: 51 mL/min — ABNORMAL LOW (ref >60)
GFR calc non Af Amer: 44 mL/min — ABNORMAL LOW (ref >60)
Glucose, Bld: 247 mg/dL — ABNORMAL HIGH (ref 65–99)
Potassium: 4.5 mmol/L (ref 3.5–5.1)
Sodium: 138 mmol/L (ref 135–145)
Total Bilirubin: 1.4 mg/dL — ABNORMAL HIGH (ref 0.3–1.2)
Total Protein: 6.9 g/dL (ref 6.5–8.1)

## 2016-05-02 LAB — CBC
HCT: 39.4 % (ref 39.0–52.0)
Hemoglobin: 12.2 g/dL — ABNORMAL LOW (ref 13.0–17.0)
MCH: 27.6 pg (ref 26.0–34.0)
MCHC: 31 g/dL (ref 30.0–36.0)
MCV: 89.1 fL (ref 78.0–100.0)
Platelets: 281 10*3/uL (ref 150–400)
RBC: 4.42 MIL/uL (ref 4.22–5.81)
RDW: 16.7 % — ABNORMAL HIGH (ref 11.5–15.5)
WBC: 11.2 10*3/uL — ABNORMAL HIGH (ref 4.0–10.5)

## 2016-05-02 LAB — SURGICAL PCR SCREEN
MRSA, PCR: NEGATIVE
Staphylococcus aureus: NEGATIVE

## 2016-05-02 LAB — ABO/RH: ABO/RH(D): O NEG

## 2016-05-02 LAB — GLUCOSE, CAPILLARY
Glucose-Capillary: 219 mg/dL — ABNORMAL HIGH (ref 65–99)
Glucose-Capillary: 230 mg/dL — ABNORMAL HIGH (ref 65–99)

## 2016-05-02 LAB — TYPE AND SCREEN
ABO/RH(D): O NEG
Antibody Screen: NEGATIVE

## 2016-05-02 LAB — PROTIME-INR
INR: 1.33 (ref 0.00–1.49)
Prothrombin Time: 16.6 seconds — ABNORMAL HIGH (ref 11.6–15.2)

## 2016-05-02 LAB — APTT: aPTT: 28 seconds (ref 24–37)

## 2016-05-02 MED ORDER — DEXTROSE 5 % IV SOLN
1.5000 g | INTRAVENOUS | Status: AC
  Administered 2016-05-03: 1.5 g via INTRAVENOUS
  Filled 2016-05-02: qty 1.5

## 2016-05-02 MED ORDER — CHLORHEXIDINE GLUCONATE CLOTH 2 % EX PADS: 6.0000 | MEDICATED_PAD | Freq: Once | CUTANEOUS | Status: DC

## 2016-05-02 NOTE — Progress Notes (Signed)
Message left for Ryan at Dr Lucianne Lei Trigt's office to inform her of lab results.

## 2016-05-02 NOTE — Pre-Procedure Instructions (Signed)
    JAHMIR SALO  05/02/2016      [facility], [place] [address] Phone: [phone] Fax: [phone]    Your procedure is scheduled on 05/03/16.  Report to [facility] Admitting at 7 A.M.  Call this number if you have problems the morning of surgery:  [phone]   Remember:  Do not eat food or drink liquids after midnight.  Take these medicines the morning of surgery with A SIP OF WATER --amiodarone,decadron,metoprolol,oxycodone   Do not wear jewelry, make-up or nail polish.  Do not wear lotions, powders, or perfumes.  You may wear deodorant.  Do not shave 48 hours prior to surgery.  Men may shave face and neck.  Do not bring valuables to the hospital.  [facility] is not responsible for any belongings or valuables.  Contacts, dentures or bridgework may not be worn into surgery.  Leave your suitcase in the car.  After surgery it may be brought to your room.  For patients admitted to the hospital, discharge time will be determined by your treatment team.  Patients discharged the day of surgery will not be allowed to drive home.   Name and phone number of your driver:    Special instructions:    Please read over the following fact sheets that you were given. MRSA Information

## 2016-05-02 NOTE — Progress Notes (Signed)
Anesthesia Chart Review: Patient is a 82 year old male scheduled for video bronchoscopy, mediastinoscopy, possible endobronchial U/S on 05/03/16 by Dr. Prescott Gum. DX: Right pancoast tumor.  History includes former smoker, CAD s/p CABG '97 and NSTEMI s/p DES LCX and DES RCA X 2 10/2012, afib, chronic systolic CHF, s/p [employer] single lead AICD with 4 discharges 02/28/16 for VT (no ischemia of stress test; Amiodarone added), HTN, BPH, PVD, AAA s/p repair '01, DM2, SOB, anxiety, tonsillectomy. BMI 30.56.  - PCP is Dr. Monico Blitz. - HEM-ONC is Dr. Ancil Linsey. - RAD-ONC is Dr. Tammi Klippel. - Neurosurgeon is Dr. Christella Noa. - Primary cardiologist is Dr. Hamilton Capri ([place]; see Care Everywhere).  - EP Cardiologist is Dr. Cristopher Peru, last visit 04/05/16. No recurrent ventricular arrhythmias on ICD interrogation. According to his 3/17 discharge summary, "in the absence of ischemia, no further cardiac w/u is planned..."  Meds include amiodarone, ASA 81 mg, Lipitor, Decadron, lisinopril, metformin, Lopressor, Nitro, Percocet, KCl, warfarin (on hold).  04/05/16 EKG ([facility]): Afib at 80 bpm with premature aberrantly conducted complexes, LAD, left BBB.  03/01/16 Nuclear stress test: IMPRESSION: 1. No definite inducible ischemia with pharmacologic stress. Large fixed defect involving the apex, inferior, septal and lateral walls. 2. Global hypokinesis with septal dyskinesia. 3. Left ventricular ejection fraction 37% 4. High-risk stress test findings*.  02/29/16 Echo: Study Conclusions - Left ventricle: The cavity size was moderately dilated. Wall  thickness was normal. Systolic function was moderately to  severely reduced. The estimated ejection fraction was in the  range of 30% to 35%. Incoordinate septal motion and inferior  akinesis. The study is not technically sufficient to allow  evaluation of LV diastolic function. - Mitral valve: Calcified annulus. Mildly  thickened leaflets .  There was mild regurgitation. - Right ventricle: The cavity size was severely dilated. Pacer wire  or catheter noted in right ventricle. Systolic function is  severely reduced. - Right atrium: Massively dilated at 36 cm2. Pacer wire or catheter  noted in right atrium. - Pulmonary arteries: PA peak pressure: 34 mm Hg (S). - Inferior vena cava: The vessel was dilated. The respirophasic  diameter changes were blunted (< 50%), consistent with elevated  central venous pressure. Impressions: - Compared to a prior study in 2013, the EF is slightly higher at  30-35% - difficult to assess wall motion given pacing/arthymia  but there appears to be inferior akinesis. (Previous EF 25% on 10/20/12.)  10/21/12 [facility] (Dr. Angelena Form): Impression: 1. Triple vessel CAD s/p 3V CABG with 2/3 patent bypass grafts.  2. Severe stenosis proximal Circumflex, culprit vessel. Now s/p successful PTCA/DES x 1 proximal Circumflex.  3. Stenosis in mid body of vein graft to Diagonal 4. Severe stenosis mid RCA, occluded vein graft to the distal RCA 5. Ischemic cardiomyopathy based on reduced LVEF on echo.  Recommendations: I carefully reviewed the anatomy before the PCI. I considered redo CABG but the patient did not wish to consider CABG. I felt that each lesion could be approached percutaneously. Since he will need a long segment of stenting in the RCA, a drug eluting platform was chosen for the Circumflex. There was an excellent result today in his culprit vessel, the Circumflex. Will continue ASA and Brilinta for now. Will review films with colleagues and plan PCI of the RCA before discharge (this would be best if this could be done before his coumadin is resumed). May also consider PCI of the vein graft to the Diagonal. I would plan on  stopping his ASA in one month since he will need to be on Brilinta and coumadin.  10/26/12 PCI: S/P Complex PCI of the mid and distal RCA using overlapping  DES.  05/02/16 CXR: IMPRESSION: 1. No change in right apical lung lesion consistent with superior sulcus tumor. 2. No infiltrate or effusion. 3. Stable mild cardiomegaly with AICD lead. 4. Stable compression of T12 vertebral body.  04/29/16 CT chest/abd/pelvis: IMPRESSION: 1. Pancoast tumor extending from the medial right upper lobe into the right posterior superior mediastinum. Mass measures 6.1 x 4.7 x 4.9 cm. Mass abuts and may invade the esophagus and posterior trachea. Mass abuts the central right subclavian artery. The mass causes erosion of the right anterior aspects of the C7, T1 and T2 vertebra. 2. There is an 11 mm nodule in the left upper lobe consistent with contralateral metastatic disease. A more ground-glass type area of opacity in the left upper lobe is noted that is likely scarring. No other convincing lung metastatic disease or metastatic disease in the chest. 3. Bilateral adrenal masses are similar to the prior CT which suggests bilateral adenomas. Metastatic disease is not excluded but felt less likely. No other evidence of metastatic disease in the abdomen or pelvis.  Preoperative labs noted. Cr 1.44. Glucose 247. H/H 12.2/39.4. PT/INR 16.6/1.33. PTT 28. A1c 7.6. (He was started on Decadron on 04/26/16 for  which may be contributing to his hyperglycemia). He will get a fasting CBG on arrival.   Patient with significant cardiac history, but with recent non-ischemia stress test. EF is low, but improved from previous. ICD perioperative device form is still pending. His pancoast tumor may invade the posterior trachea (images in PACS for review as needed). Further evaluation by his anesthesiologist on the day of surgery to ensure no acute symptoms. Notes from Dr. Prescott Gum indicate that patient will be admitted for 24 hours observation post-operatively. Reviewed with anesthesiologist Dr. Lissa Hoard.  [facility]/Anesthesiology Phone [phone] 05/02/2016 4:36 PM

## 2016-05-02 NOTE — Progress Notes (Signed)
Joey at [employer] of surgery tomorrow.PT stated his icd discharged 2 mos ago.

## 2016-05-03 ENCOUNTER — Ambulatory Visit (HOSPITAL_COMMUNITY): Payer: Medicare Other | Admitting: Certified Registered Nurse Anesthetist

## 2016-05-03 ENCOUNTER — Encounter (HOSPITAL_COMMUNITY): Payer: Self-pay | Admitting: *Deleted

## 2016-05-03 ENCOUNTER — Ambulatory Visit (HOSPITAL_COMMUNITY): Payer: Medicare Other

## 2016-05-03 ENCOUNTER — Observation Stay (HOSPITAL_COMMUNITY)
Admission: RE | Admit: 2016-05-03 | Discharge: 2016-05-04 | Disposition: A | Discharge status: Home / Self Care | Payer: Medicare Other | Source: Ambulatory Visit | Attending: Cardiothoracic Surgery | Admitting: Cardiothoracic Surgery

## 2016-05-03 ENCOUNTER — Ambulatory Visit (HOSPITAL_COMMUNITY): Payer: Medicare Other | Admitting: Vascular Surgery

## 2016-05-03 ENCOUNTER — Encounter (HOSPITAL_COMMUNITY)
Admission: RE | Disposition: A | Discharge status: Home / Self Care | Payer: Self-pay | Source: Ambulatory Visit | Attending: Cardiothoracic Surgery

## 2016-05-03 DIAGNOSIS — Z7982 Long term (current) use of aspirin: Secondary | ICD-10-CM | POA: Insufficient documentation

## 2016-05-03 DIAGNOSIS — R0602 Shortness of breath: Secondary | ICD-10-CM

## 2016-05-03 DIAGNOSIS — M25511 Pain in right shoulder: Secondary | ICD-10-CM | POA: Diagnosis not present

## 2016-05-03 DIAGNOSIS — Z7984 Long term (current) use of oral hypoglycemic drugs: Secondary | ICD-10-CM | POA: Insufficient documentation

## 2016-05-03 DIAGNOSIS — Z419 Encounter for procedure for purposes other than remedying health state, unspecified: Secondary | ICD-10-CM

## 2016-05-03 DIAGNOSIS — I472 Ventricular tachycardia: Secondary | ICD-10-CM | POA: Insufficient documentation

## 2016-05-03 DIAGNOSIS — E1151 Type 2 diabetes mellitus with diabetic peripheral angiopathy without gangrene: Secondary | ICD-10-CM | POA: Insufficient documentation

## 2016-05-03 DIAGNOSIS — I509 Heart failure, unspecified: Secondary | ICD-10-CM | POA: Insufficient documentation

## 2016-05-03 DIAGNOSIS — I252 Old myocardial infarction: Secondary | ICD-10-CM | POA: Insufficient documentation

## 2016-05-03 DIAGNOSIS — C3411 Malignant neoplasm of upper lobe, right bronchus or lung: Secondary | ICD-10-CM

## 2016-05-03 DIAGNOSIS — Z7901 Long term (current) use of anticoagulants: Secondary | ICD-10-CM | POA: Insufficient documentation

## 2016-05-03 DIAGNOSIS — Z79899 Other long term (current) drug therapy: Secondary | ICD-10-CM | POA: Insufficient documentation

## 2016-05-03 DIAGNOSIS — I255 Ischemic cardiomyopathy: Secondary | ICD-10-CM | POA: Insufficient documentation

## 2016-05-03 DIAGNOSIS — I110 Hypertensive heart disease with heart failure: Secondary | ICD-10-CM | POA: Insufficient documentation

## 2016-05-03 DIAGNOSIS — I482 Chronic atrial fibrillation: Secondary | ICD-10-CM | POA: Insufficient documentation

## 2016-05-03 DIAGNOSIS — Z87891 Personal history of nicotine dependence: Secondary | ICD-10-CM | POA: Insufficient documentation

## 2016-05-03 DIAGNOSIS — I2510 Atherosclerotic heart disease of native coronary artery without angina pectoris: Secondary | ICD-10-CM | POA: Diagnosis not present

## 2016-05-03 DIAGNOSIS — Z951 Presence of aortocoronary bypass graft: Secondary | ICD-10-CM | POA: Insufficient documentation

## 2016-05-03 HISTORY — PX: MEDIASTINOSCOPY: SHX5086

## 2016-05-03 HISTORY — PX: VIDEO BRONCHOSCOPY WITH ENDOBRONCHIAL ULTRASOUND: SHX6177

## 2016-05-03 LAB — BLOOD GAS, ARTERIAL
Acid-Base Excess: 3.9 mmol/L — ABNORMAL HIGH (ref 0.0–2.0)
Bicarbonate: 28.2 mEq/L — ABNORMAL HIGH (ref 20.0–24.0)
O2 Content: 2 L/min
O2 Saturation: 97.8 %
Patient temperature: 98.6
TCO2: 29.6 mmol/L (ref 0–100)
pCO2 arterial: 45.2 mmHg — ABNORMAL HIGH (ref 35.0–45.0)
pH, Arterial: 7.413 (ref 7.350–7.450)
pO2, Arterial: 103 mmHg — ABNORMAL HIGH (ref 80.0–100.0)

## 2016-05-03 LAB — GLUCOSE, CAPILLARY
Glucose-Capillary: 162 mg/dL — ABNORMAL HIGH (ref 65–99)
Glucose-Capillary: 170 mg/dL — ABNORMAL HIGH (ref 65–99)
Glucose-Capillary: 217 mg/dL — ABNORMAL HIGH (ref 65–99)
Glucose-Capillary: 151 mg/dL — ABNORMAL HIGH (ref 65–99)
Glucose-Capillary: 142 mg/dL — ABNORMAL HIGH (ref 65–99)

## 2016-05-03 SURGERY — MEDIASTINOSCOPY
Anesthesia: General

## 2016-05-03 MED ORDER — METOPROLOL TARTRATE 25 MG PO TABS
75.0000 mg | ORAL_TABLET | Freq: Two times a day (BID) | ORAL | Status: DC
  Administered 2016-05-04: 75 mg via ORAL
  Filled 2016-05-03 (×2): qty 3

## 2016-05-03 MED ORDER — DEXTROSE 5 % IV SOLN
0.0000 ug/min | INTRAVENOUS | Status: AC
  Administered 2016-05-03: 2 ug/min via INTRAVENOUS
  Filled 2016-05-03: qty 4

## 2016-05-03 MED ORDER — FENTANYL CITRATE (PF) 100 MCG/2ML IJ SOLN
25.0000 ug | INTRAMUSCULAR | Status: DC | PRN
  Administered 2016-05-03 (×3): 50 ug via INTRAVENOUS

## 2016-05-03 MED ORDER — ALBUMIN HUMAN 5 % IV SOLN
INTRAVENOUS | Status: DC | PRN
  Administered 2016-05-03 (×2): via INTRAVENOUS

## 2016-05-03 MED ORDER — OXYCODONE HCL 5 MG PO TABS
5.0000 mg | ORAL_TABLET | Freq: Once | ORAL | Status: AC | PRN
  Administered 2016-05-03: 5 mg via ORAL

## 2016-05-03 MED ORDER — PROPOFOL 10 MG/ML IV BOLUS
INTRAVENOUS | Status: AC
  Filled 2016-05-03: qty 20

## 2016-05-03 MED ORDER — ACETAMINOPHEN 160 MG/5ML PO SOLN
1000.0000 mg | Freq: Four times a day (QID) | ORAL | Status: DC
  Administered 2016-05-04: 1000 mg via ORAL

## 2016-05-03 MED ORDER — TRAMADOL HCL 50 MG PO TABS
ORAL_TABLET | ORAL | Status: AC
  Filled 2016-05-03: qty 1

## 2016-05-03 MED ORDER — AMIODARONE HCL 200 MG PO TABS
200.0000 mg | ORAL_TABLET | Freq: Two times a day (BID) | ORAL | Status: DC
  Administered 2016-05-03 – 2016-05-04 (×2): 200 mg via ORAL
  Filled 2016-05-03 (×2): qty 1

## 2016-05-03 MED ORDER — ACETAMINOPHEN 500 MG PO TABS
1000.0000 mg | ORAL_TABLET | Freq: Four times a day (QID) | ORAL | Status: DC
  Filled 2016-05-03: qty 2

## 2016-05-03 MED ORDER — FENTANYL CITRATE (PF) 250 MCG/5ML IJ SOLN
INTRAMUSCULAR | Status: DC | PRN
  Administered 2016-05-03: 100 ug via INTRAVENOUS
  Administered 2016-05-03 (×3): 50 ug via INTRAVENOUS

## 2016-05-03 MED ORDER — LIDOCAINE 2% (20 MG/ML) 5 ML SYRINGE
INTRAMUSCULAR | Status: DC | PRN
  Administered 2016-05-03: 100 mg via INTRAVENOUS

## 2016-05-03 MED ORDER — FENTANYL CITRATE (PF) 250 MCG/5ML IJ SOLN
INTRAMUSCULAR | Status: AC
  Filled 2016-05-03: qty 5

## 2016-05-03 MED ORDER — OXYCODONE HCL 5 MG PO TABS
ORAL_TABLET | ORAL | Status: AC
  Administered 2016-05-03: 5 mg via ORAL
  Filled 2016-05-03: qty 1

## 2016-05-03 MED ORDER — LACTATED RINGERS IV SOLN
INTRAVENOUS | Status: DC
  Administered 2016-05-03 (×2): via INTRAVENOUS

## 2016-05-03 MED ORDER — PROPOFOL 10 MG/ML IV BOLUS
INTRAVENOUS | Status: AC
Start: 2016-05-03 — End: 2016-05-03
  Filled 2016-05-03: qty 20

## 2016-05-03 MED ORDER — FENTANYL CITRATE (PF) 100 MCG/2ML IJ SOLN
INTRAMUSCULAR | Status: AC
  Filled 2016-05-03: qty 2

## 2016-05-03 MED ORDER — ROCURONIUM BROMIDE 100 MG/10ML IV SOLN
INTRAVENOUS | Status: DC | PRN
  Administered 2016-05-03: 40 mg via INTRAVENOUS
  Administered 2016-05-03 (×2): 10 mg via INTRAVENOUS
  Administered 2016-05-03: 40 mg via INTRAVENOUS
  Administered 2016-05-03: 10 mg via INTRAVENOUS

## 2016-05-03 MED ORDER — SENNOSIDES-DOCUSATE SODIUM 8.6-50 MG PO TABS
1.0000 | ORAL_TABLET | Freq: Every day | ORAL | Status: DC
  Administered 2016-05-03: 1 via ORAL
  Filled 2016-05-03: qty 1

## 2016-05-03 MED ORDER — ONDANSETRON HCL 4 MG/2ML IJ SOLN
INTRAMUSCULAR | Status: DC | PRN
  Administered 2016-05-03: 4 mg via INTRAVENOUS

## 2016-05-03 MED ORDER — ONDANSETRON HCL 4 MG/2ML IJ SOLN: 4.0000 mg | Freq: Once | INTRAMUSCULAR | Status: DC | PRN

## 2016-05-03 MED ORDER — KCL IN DEXTROSE-NACL 20-5-0.45 MEQ/L-%-% IV SOLN
INTRAVENOUS | Status: DC
  Administered 2016-05-03 – 2016-05-04 (×2): via INTRAVENOUS
  Filled 2016-05-03: qty 1000

## 2016-05-03 MED ORDER — BISACODYL 5 MG PO TBEC
10.0000 mg | DELAYED_RELEASE_TABLET | Freq: Every day | ORAL | Status: DC
  Administered 2016-05-04: 10 mg via ORAL
  Filled 2016-05-03: qty 2

## 2016-05-03 MED ORDER — NITROGLYCERIN 0.4 MG SL SUBL: 0.4000 mg | SUBLINGUAL_TABLET | SUBLINGUAL | Status: DC | PRN

## 2016-05-03 MED ORDER — PROPOFOL 10 MG/ML IV BOLUS
INTRAVENOUS | Status: DC | PRN
  Administered 2016-05-03: 70 mg via INTRAVENOUS

## 2016-05-03 MED ORDER — INSULIN ASPART 100 UNIT/ML ~~LOC~~ SOLN
0.0000 [IU] | Freq: Three times a day (TID) | SUBCUTANEOUS | Status: DC
  Administered 2016-05-03 – 2016-05-04 (×2): 2 [IU] via SUBCUTANEOUS

## 2016-05-03 MED ORDER — FENTANYL CITRATE (PF) 100 MCG/2ML IJ SOLN
INTRAMUSCULAR | Status: AC
  Administered 2016-05-03: 50 ug via INTRAVENOUS
  Filled 2016-05-03: qty 2

## 2016-05-03 MED ORDER — SUGAMMADEX SODIUM 200 MG/2ML IV SOLN
INTRAVENOUS | Status: AC
  Filled 2016-05-03: qty 2

## 2016-05-03 MED ORDER — POTASSIUM CHLORIDE 10 MEQ/50ML IV SOLN: 10.0000 meq | Freq: Every day | INTRAVENOUS | Status: DC | PRN

## 2016-05-03 MED ORDER — 0.9 % SODIUM CHLORIDE (POUR BTL) OPTIME
TOPICAL | Status: DC | PRN
  Administered 2016-05-03 (×2): 1000 mL

## 2016-05-03 MED ORDER — OXYCODONE HCL 5 MG PO TABS
5.0000 mg | ORAL_TABLET | ORAL | Status: DC | PRN
  Administered 2016-05-04: 10 mg via ORAL
  Filled 2016-05-03: qty 2

## 2016-05-03 MED ORDER — PHENYLEPHRINE HCL 10 MG/ML IJ SOLN
INTRAMUSCULAR | Status: DC | PRN
Start: 2016-05-03 — End: 2016-05-03
  Administered 2016-05-03 (×3): 80 ug via INTRAVENOUS

## 2016-05-03 MED ORDER — SUCCINYLCHOLINE CHLORIDE 200 MG/10ML IV SOSY
PREFILLED_SYRINGE | INTRAVENOUS | Status: DC | PRN
  Administered 2016-05-03: 80 mg via INTRAVENOUS

## 2016-05-03 MED ORDER — OXYCODONE-ACETAMINOPHEN 5-325 MG PO TABS
ORAL_TABLET | ORAL | Status: AC
  Administered 2016-05-03: 1
  Filled 2016-05-03: qty 1

## 2016-05-03 MED ORDER — DEXAMETHASONE 2 MG PO TABS
2.0000 mg | ORAL_TABLET | Freq: Two times a day (BID) | ORAL | Status: DC
  Administered 2016-05-04: 2 mg via ORAL
  Filled 2016-05-03 (×2): qty 1

## 2016-05-03 MED ORDER — ROCURONIUM BROMIDE 50 MG/5ML IV SOLN
INTRAVENOUS | Status: AC
  Filled 2016-05-03: qty 1

## 2016-05-03 MED ORDER — SUGAMMADEX SODIUM 200 MG/2ML IV SOLN
INTRAVENOUS | Status: DC | PRN
  Administered 2016-05-03: 200 mg via INTRAVENOUS

## 2016-05-03 MED ORDER — HEMOSTATIC AGENTS (NO CHARGE) OPTIME
TOPICAL | Status: DC | PRN
  Administered 2016-05-03 (×5): 1 via TOPICAL

## 2016-05-03 MED ORDER — OXYCODONE-ACETAMINOPHEN 7.5-325 MG PO TABS
1.0000 | ORAL_TABLET | ORAL | Status: DC | PRN
  Administered 2016-05-03: 1 via ORAL
  Filled 2016-05-03: qty 1

## 2016-05-03 MED ORDER — FENTANYL CITRATE (PF) 100 MCG/2ML IJ SOLN
25.0000 ug | INTRAMUSCULAR | Status: DC | PRN
  Administered 2016-05-03: 25 ug via INTRAVENOUS

## 2016-05-03 MED ORDER — PHENYLEPHRINE HCL 10 MG/ML IJ SOLN
10.0000 mg | INTRAVENOUS | Status: DC | PRN
  Administered 2016-05-03: 20 ug/min via INTRAVENOUS

## 2016-05-03 MED ORDER — ONDANSETRON HCL 4 MG/2ML IJ SOLN: 4.0000 mg | Freq: Four times a day (QID) | INTRAMUSCULAR | Status: DC | PRN

## 2016-05-03 MED ORDER — TRAMADOL HCL 50 MG PO TABS
50.0000 mg | ORAL_TABLET | Freq: Four times a day (QID) | ORAL | Status: DC | PRN
  Administered 2016-05-03: 100 mg via ORAL

## 2016-05-03 MED ORDER — OXYCODONE HCL 5 MG/5ML PO SOLN: 5.0000 mg | Freq: Once | ORAL | Status: AC | PRN

## 2016-05-03 MED ORDER — ONDANSETRON HCL 4 MG/2ML IJ SOLN
INTRAMUSCULAR | Status: AC
Start: 2016-05-03 — End: 2016-05-03
  Filled 2016-05-03: qty 2

## 2016-05-03 SURGICAL SUPPLY — 77 items
ADH SKN CLS APL DERMABOND .7 (GAUZE/BANDAGES/DRESSINGS) ×1
BALL CTTN LRG ABS STRL LF (GAUZE/BANDAGES/DRESSINGS)
BLADE SURG 10 STRL SS (BLADE) ×3 IMPLANT
BLADE SURG 15 STRL LF DISP TIS (BLADE) ×1 IMPLANT
BLADE SURG 15 STRL SS (BLADE) ×3
BRUSH CYTOL CELLEBRITY 1.5X140 (MISCELLANEOUS) IMPLANT
CANISTER SUCTION 2500CC (MISCELLANEOUS) ×4 IMPLANT
CLIP TI MEDIUM 6 (CLIP) ×4 IMPLANT
CLIP TI WIDE RED SMALL 6 (CLIP) IMPLANT
CONT SPEC 4OZ CLIKSEAL STRL BL (MISCELLANEOUS) ×9 IMPLANT
COTTONBALL LRG STERILE PKG (GAUZE/BANDAGES/DRESSINGS) IMPLANT
COVER DOME SNAP 22 D (MISCELLANEOUS) ×3 IMPLANT
COVER SURGICAL LIGHT HANDLE (MISCELLANEOUS) ×4 IMPLANT
COVER TABLE BACK 60X90 (DRAPES) ×4 IMPLANT
DERMABOND ADVANCED (GAUZE/BANDAGES/DRESSINGS) ×2
DERMABOND ADVANCED .7 DNX12 (GAUZE/BANDAGES/DRESSINGS) ×1 IMPLANT
DRAPE LAPAROTOMY T 102X78X121 (DRAPES) ×3 IMPLANT
DRSG AQUACEL AG ADV 3.5X14 (GAUZE/BANDAGES/DRESSINGS) ×1 IMPLANT
ELECT CAUTERY BLADE 6.4 (BLADE) ×3 IMPLANT
ELECT REM PT RETURN 9FT ADLT (ELECTROSURGICAL) ×3
ELECTRODE REM PT RTRN 9FT ADLT (ELECTROSURGICAL) ×1 IMPLANT
FORCEPS BIOP RJ4 1.8 (CUTTING FORCEPS) IMPLANT
GAUZE SPONGE 4X4 12PLY STRL (GAUZE/BANDAGES/DRESSINGS) ×5 IMPLANT
GAUZE SPONGE 4X4 16PLY XRAY LF (GAUZE/BANDAGES/DRESSINGS) ×5 IMPLANT
GLOVE BIO SURGEON STRL SZ 6.5 (GLOVE) ×2 IMPLANT
GLOVE BIO SURGEON STRL SZ7.5 (GLOVE) ×9 IMPLANT
GLOVE BIO SURGEONS STRL SZ 6.5 (GLOVE) ×2
GLOVE BIOGEL PI IND STRL 8 (GLOVE) IMPLANT
GLOVE BIOGEL PI INDICATOR 8 (GLOVE) ×2
GLOVE SURG SS PI 8.0 STRL IVOR (GLOVE) ×2 IMPLANT
GOWN STRL REUS W/ TWL LRG LVL3 (GOWN DISPOSABLE) ×2 IMPLANT
GOWN STRL REUS W/TWL LRG LVL3 (GOWN DISPOSABLE) ×6
HEMOSTAT SURGICEL 2X14 (HEMOSTASIS) ×8 IMPLANT
KIT BASIN OR (CUSTOM PROCEDURE TRAY) ×3 IMPLANT
KIT CLEAN ENDO COMPLIANCE (KITS) ×8 IMPLANT
KIT ROOM TURNOVER OR (KITS) ×4 IMPLANT
MARKER SKIN DUAL TIP RULER LAB (MISCELLANEOUS) ×4 IMPLANT
NDL BIOPSY TRANSBRONCH 21G (NEEDLE) IMPLANT
NDL BLUNT 18X1 FOR OR ONLY (NEEDLE) IMPLANT
NEEDLE 22X1 1/2 (OR ONLY) (NEEDLE) IMPLANT
NEEDLE BIOPSY TRANSBRONCH 21G (NEEDLE) IMPLANT
NEEDLE BLUNT 18X1 FOR OR ONLY (NEEDLE) IMPLANT
NEEDLE HYPO 22GX1.5 SAFETY (NEEDLE) IMPLANT
NEEDLE SONO TIP II EBUS (NEEDLE) ×3 IMPLANT
NS IRRIG 1000ML POUR BTL (IV SOLUTION) ×6 IMPLANT
OIL SILICONE PENTAX (PARTS (SERVICE/REPAIRS)) ×4 IMPLANT
PACK SURGICAL SETUP 50X90 (CUSTOM PROCEDURE TRAY) ×1 IMPLANT
PAD ARMBOARD 7.5X6 YLW CONV (MISCELLANEOUS) ×8 IMPLANT
PENCIL BUTTON HOLSTER BLD 10FT (ELECTRODE) ×5 IMPLANT
SOLUTION ANTI FOG 6CC (MISCELLANEOUS) ×2 IMPLANT
SPONGE INTESTINAL PEANUT (DISPOSABLE) ×2 IMPLANT
STAPLER VISISTAT 35W (STAPLE) IMPLANT
SURGIFLO W/THROMBIN 8M KIT (HEMOSTASIS) ×2 IMPLANT
SUT SILK 2 0 TIES 10X30 (SUTURE) IMPLANT
SUT VIC AB 2-0 CT1 27 (SUTURE) ×3
SUT VIC AB 2-0 CT1 TAPERPNT 27 (SUTURE) ×1 IMPLANT
SUT VIC AB 3-0 SH 27 (SUTURE) ×3
SUT VIC AB 3-0 SH 27X BRD (SUTURE) ×1 IMPLANT
SUT VIC AB 3-0 SH 8-18 (SUTURE) ×3 IMPLANT
SUT VIC AB 3-0 X1 27 (SUTURE) ×3 IMPLANT
SWAB COLLECTION DEVICE MRSA (MISCELLANEOUS) IMPLANT
SYR 20CC LL (SYRINGE) ×3 IMPLANT
SYR 20ML ECCENTRIC (SYRINGE) ×4 IMPLANT
SYR 5ML LUER SLIP (SYRINGE) ×2 IMPLANT
SYR BULB IRRIGATION 50ML (SYRINGE) ×2 IMPLANT
SYR CONTROL 10ML LL (SYRINGE) IMPLANT
SYRINGE 10CC LL (SYRINGE) ×1 IMPLANT
TOWEL OR 17X24 6PK STRL BLUE (TOWEL DISPOSABLE) ×1 IMPLANT
TOWEL OR 17X26 10 PK STRL BLUE (TOWEL DISPOSABLE) ×8 IMPLANT
TRAP SPECIMEN MUCOUS 40CC (MISCELLANEOUS) ×4 IMPLANT
TUBE ANAEROBIC SPECIMEN COL (MISCELLANEOUS) IMPLANT
TUBE CONNECTING 12'X1/4 (SUCTIONS) ×1
TUBE CONNECTING 12X1/4 (SUCTIONS) ×2 IMPLANT
TUBE CONNECTING 20'X1/4 (TUBING) ×2
TUBE CONNECTING 20X1/4 (TUBING) ×6 IMPLANT
WATER STERILE IRR 1000ML POUR (IV SOLUTION) ×3 IMPLANT
YANKAUER SUCT BULB TIP NO VENT (SUCTIONS) ×2 IMPLANT

## 2016-05-03 NOTE — Anesthesia Preprocedure Evaluation (Addendum)
Anesthesia Evaluation  Patient identified by MRN, date of birth, ID band Patient awake    Reviewed: Allergy & Precautions, H&P , NPO status , Patient's Chart, lab work & pertinent test results  History of Anesthesia Complications Negative for: history of anesthetic complications  Airway Mallampati: II  TM Distance: >3 FB Neck ROM: full    Dental  (+) Edentulous Lower, Edentulous Upper   Pulmonary former smoker,    Pulmonary exam normal breath sounds clear to auscultation       Cardiovascular hypertension, + CAD, + Past MI, + Cardiac Stents, + CABG, + Peripheral Vascular Disease and +CHF  Normal cardiovascular exam+ Cardiac Defibrillator  Rhythm:regular Rate:Normal  EF 30%-35%, severely dilated RV and RA with significantly reduced right sided heart function   Neuro/Psych Anxiety negative neurological ROS     GI/Hepatic negative GI ROS, Neg liver ROS,   Endo/Other  diabetes  Renal/GU negative Renal ROS     Musculoskeletal   Abdominal   Peds  Hematology negative hematology ROS (+)   Anesthesia Other Findings   Reproductive/Obstetrics negative OB ROS                          Anesthesia Physical Anesthesia Plan  ASA: IV  Anesthesia Plan: General   Post-op Pain Management:    Induction: Intravenous  Airway Management Planned: Oral ETT  Additional Equipment: Arterial line  Intra-op Plan:   Post-operative Plan: Possible Post-op intubation/ventilation  Informed Consent: I have reviewed the patients History and Physical, chart, labs and discussed the procedure including the risks, benefits and alternatives for the proposed anesthesia with the patient or authorized representative who has indicated his/her understanding and acceptance.   Dental Advisory Given  Plan Discussed with: Anesthesiologist, CRNA and Surgeon  Anesthesia Plan Comments: (Will need A line given cardiac  function Have magnet available for AICD)        Anesthesia Quick Evaluation

## 2016-05-03 NOTE — Progress Notes (Signed)
Patient transferred from PACU, VSS, pt alert and oriented. Family at bedside will continue to monitor.

## 2016-05-03 NOTE — Anesthesia Postprocedure Evaluation (Signed)
Anesthesia Post Note  Patient: George Pollard  Procedure(s) Performed: Procedure(s) (LRB): MEDIASTINOSCOPY (N/A) VIDEO BRONCHOSCOPY WITH ENDOBRONCHIAL ULTRASOUND (N/A)  Patient location during evaluation: PACU Anesthesia Type: General Level of consciousness: awake and alert Pain management: pain level controlled Vital Signs Assessment: post-procedure vital signs reviewed and stable Respiratory status: spontaneous breathing, nonlabored ventilation, respiratory function stable and patient connected to nasal cannula oxygen Cardiovascular status: blood pressure returned to baseline and stable Postop Assessment: no signs of nausea or vomiting Anesthetic complications: no    Last Vitals:  Filed Vitals:   05/03/16 0707 05/03/16 1403  BP: 138/76 123/70  Pulse: 86 70  Temp: 36.4 C 36.9 C  Resp: 20 12    Last Pain:  Filed Vitals:   05/03/16 1408  PainSc: 0-No pain                 Zenaida Deed

## 2016-05-03 NOTE — Progress Notes (Signed)
[employer] rep, Buyer, retail, returned page. Recommends using a magnet over ICD during procedure since no cautery will be used, does not feel interrogation is needed.

## 2016-05-03 NOTE — Brief Op Note (Signed)
05/03/2016  1:35 PM  PATIENT:  George Pollard  82 y.o. male  PRE-OPERATIVE DIAGNOSIS:  RIGHT PANCOAST TUMOR  POST-OPERATIVE DIAGNOSIS:  RIGHT PANCOAST TUMOR  PROCEDURE:  Procedure(s): MEDIASTINOSCOPY (N/A) VIDEO BRONCHOSCOPY WITH ENDOBRONCHIAL ULTRASOUND (N/A)  SURGEON:  Surgeon(s) and Role:    * Ivin Poot, MD - Primary  PHYSICIAN ASSISTANT:   ASSISTANTS: none   ANESTHESIA:   general  EBL:  Total I/O In: 1800 [I.V.:1300; IV Piggyback:500] Out: 350 [Blood:350]  BLOOD ADMINISTERED:none  DRAINS: none   LOCAL MEDICATIONS USED:  NONE  SPECIMEN:  Aspirate, Biopsy / Limited Resection and Lavage/Washing                        RUL washings                       EBUS of 4R nodes                       Mediastinoscopy of 2 R nodes DISPOSITION OF SPECIMEN:  PATHOLOGY  COUNTS:  YES  TOURNIQUET:  * No tourniquets in log *  DICTATION: .Dragon Dictation  PLAN OF CARE: Admit for overnight observation  PATIENT DISPOSITION:  PACU - hemodynamically stable.   Delay start of Pharmacological VTE agent (>24hrs) due to surgical blood loss or risk of bleeding: yes

## 2016-05-03 NOTE — Anesthesia Procedure Notes (Signed)
Procedure Name: Intubation Date/Time: 05/03/2016 10:26 AM Performed by: Layla Maw Pre-anesthesia Checklist: Patient identified, Patient being monitored, Timeout performed, Emergency Drugs available and Suction available Patient Re-evaluated:Patient Re-evaluated prior to inductionOxygen Delivery Method: Circle System Utilized Preoxygenation: Pre-oxygenation with 100% oxygen Intubation Type: IV induction Ventilation: Mask ventilation without difficulty and Oral airway inserted - appropriate to patient size Laryngoscope Size: McGraph, Mac and 4 Grade View: Grade I Tube type: Oral Tube size: 8.0 mm Number of attempts: 1 Airway Equipment and Method: Stylet and Video-laryngoscopy Placement Confirmation: ETT inserted through vocal cords under direct vision,  positive ETCO2 and breath sounds checked- equal and bilateral Secured at: 23 cm Tube secured with: Tape Dental Injury: Teeth and Oropharynx as per pre-operative assessment  Comments: McGraph used in order to verify tube depth

## 2016-05-03 NOTE — Transfer of Care (Signed)
Immediate Anesthesia Transfer of Care Note  Patient: George Pollard  Procedure(s) Performed: Procedure(s): MEDIASTINOSCOPY (N/A) VIDEO BRONCHOSCOPY WITH ENDOBRONCHIAL ULTRASOUND (N/A)  Patient Location: PACU  Anesthesia Type:General  Level of Consciousness: awake, alert , oriented and patient cooperative  Airway & Oxygen Therapy: Patient Spontanous Breathing and Patient connected to face mask oxygen  Post-op Assessment: Report given to RN, Post -op Vital signs reviewed and stable and Patient moving all extremities X 4  Post vital signs: Reviewed and stable  Last Vitals:  Filed Vitals:   05/03/16 0707  BP: 138/76  Pulse: 86  Temp: 36.4 C  Resp: 20    Last Pain:  Filed Vitals:   05/03/16 0734  PainSc: 8          Complications: No apparent anesthesia complications

## 2016-05-03 NOTE — Progress Notes (Addendum)
[place] paged Heron Sabins [place]) at this time per Dr. Jacinto Reap. Judd's request, aware of ICD discharge in the last 2 months. Emergency orders placed on chart. EP Cardiologist is Dr. Cristopher Peru, last visit 04/05/16. Did not receive orders from Dr. Tanna Furry office after faxing form yesterday.

## 2016-05-03 NOTE — Progress Notes (Signed)
The patient was examined and preop studies reviewed. There has been no change from the prior exam and the patient is ready for surgery.   Plan bronchoscopy, EBUS biopsy and mediastinoscopy and biopsy on George Pollard

## 2016-05-04 ENCOUNTER — Observation Stay (HOSPITAL_COMMUNITY): Payer: Medicare Other

## 2016-05-04 DIAGNOSIS — C3411 Malignant neoplasm of upper lobe, right bronchus or lung: Secondary | ICD-10-CM | POA: Diagnosis not present

## 2016-05-04 LAB — BASIC METABOLIC PANEL
Anion gap: 7 (ref 5–15)
BUN: 22 mg/dL — ABNORMAL HIGH (ref 6–20)
CO2: 29 mmol/L (ref 22–32)
Calcium: 9.1 mg/dL (ref 8.9–10.3)
Chloride: 98 mmol/L — ABNORMAL LOW (ref 101–111)
Creatinine, Ser: 0.94 mg/dL (ref 0.61–1.24)
GFR calc Af Amer: >60 mL/min (ref >60)
GFR calc non Af Amer: >60 mL/min (ref >60)
Glucose, Bld: 125 mg/dL — ABNORMAL HIGH (ref 65–99)
Potassium: 4.2 mmol/L (ref 3.5–5.1)
Sodium: 134 mmol/L — ABNORMAL LOW (ref 135–145)

## 2016-05-04 LAB — CBC
HCT: 35.1 % — ABNORMAL LOW (ref 39.0–52.0)
Hemoglobin: 10.5 g/dL — ABNORMAL LOW (ref 13.0–17.0)
MCH: 26.9 pg (ref 26.0–34.0)
MCHC: 29.9 g/dL — ABNORMAL LOW (ref 30.0–36.0)
MCV: 90 fL (ref 78.0–100.0)
Platelets: 207 10*3/uL (ref 150–400)
RBC: 3.9 MIL/uL — ABNORMAL LOW (ref 4.22–5.81)
RDW: 17 % — ABNORMAL HIGH (ref 11.5–15.5)
WBC: 10.1 10*3/uL (ref 4.0–10.5)

## 2016-05-04 LAB — POCT I-STAT 4, (NA,K, GLUC, HGB,HCT)
Glucose, Bld: 223 mg/dL — ABNORMAL HIGH (ref 65–99)
HCT: 34 % — ABNORMAL LOW (ref 39.0–52.0)
Hemoglobin: 11.6 g/dL — ABNORMAL LOW (ref 13.0–17.0)
Potassium: 4.4 mmol/L (ref 3.5–5.1)
Sodium: 137 mmol/L (ref 135–145)

## 2016-05-04 LAB — GLUCOSE, CAPILLARY: Glucose-Capillary: 121 mg/dL — ABNORMAL HIGH (ref 65–99)

## 2016-05-04 MED ORDER — OXYCODONE HCL 5 MG PO TABS: 5.0000 mg | ORAL_TABLET | Freq: Four times a day (QID) | ORAL | Status: DC | PRN

## 2016-05-04 NOTE — Discharge Instructions (Signed)

## 2016-05-04 NOTE — Progress Notes (Signed)
[address]       [place] [address]             [phone]      1 Day Post-Op Procedure(s) (LRB): MEDIASTINOSCOPY (N/A) VIDEO BRONCHOSCOPY WITH ENDOBRONCHIAL ULTRASOUND (N/A) Subjective: Feels ok, is hoarse since procedure  Objective: Vital signs in last 24 hours: Temp:  [97.7 F (36.5 C)-98.8 F (37.1 C)] 97.7 F (36.5 C) (05/20 0653) Pulse Rate:  [63-87] 85 (05/20 0653) Cardiac Rhythm:  [-] Atrial fibrillation (05/20 0700) Resp:  [11-23] 15 (05/20 0653) BP: (95-133)/(47-84) 133/68 mmHg (05/20 0653) SpO2:  [93 %-100 %] 94 % (05/20 0653) Arterial Line BP: (93-142)/(53-88) 126/58 mmHg (05/20 0400)  Hemodynamic parameters for last 24 hours:    Intake/Output from previous day: 05/19 0701 - 05/20 0700 In: 2800 [P.O.:100; I.V.:2200; IV Piggyback:500] Out: 2700 [Urine:2350; Blood:350] Intake/Output this shift: Total I/O In: 390 [P.O.:240; I.V.:150] Out: -   PE  General: alert, no distress Lungs: dim in right upper fields Cor: irregular Dressing CDI, no hematoma    Lab Results:  Recent Labs  05/02/16 1214 05/04/16 0537  WBC 11.2* 10.1  HGB 12.2* 10.5*  HCT 39.4 35.1*  PLT 281 207   BMET:  Recent Labs  05/02/16 1214 05/04/16 0537  NA 138 134*  K 4.5 4.2  CL 96* 98*  CO2 32 29  GLUCOSE 247* 125*  BUN 35* 22*  CREATININE 1.44* 0.94  CALCIUM 9.5 9.1    PT/INR:  Recent Labs  05/02/16 1214  LABPROT 16.6*  INR 1.33   ABG    Component Value Date/Time   PHART 7.413 05/03/2016 1647   HCO3 28.2* 05/03/2016 1647   TCO2 29.6 05/03/2016 1647   O2SAT 97.8 05/03/2016 1647   CBG (last 3)   Recent Labs  05/03/16 1754 05/03/16 2154 05/04/16 0732  GLUCAP 151* 142* 121*    Meds Scheduled Meds: . acetaminophen  1,000 mg Oral Q6H   Or  . acetaminophen (TYLENOL) oral liquid 160 mg/5 mL  1,000 mg Oral Q6H  . amiodarone  200 mg Oral BID  . bisacodyl  10 mg Oral Daily  . dexamethasone  2 mg Oral BID WC  . insulin aspart   0-24 Units Subcutaneous TID AC & HS  . metoprolol tartrate  75 mg Oral BID  . senna-docusate  1 tablet Oral QHS   Continuous Infusions: . dextrose 5 % and 0.45 % NaCl with KCl 20 mEq/L 50 mL/hr at 05/04/16 0900   PRN Meds:.nitroGLYCERIN, ondansetron (ZOFRAN) IV, oxyCODONE, potassium chloride, traMADol  Xrays Dg Chest 2 View  05/02/2016  CLINICAL DATA:  History of coronary artery disease and mediastinoscopy, CABG EXAM: CHEST  2 VIEW COMPARISON:  CT chest of 04/29/2016 and chest x-ray of 02/28/2016 FINDINGS: The medial right apical lung lesion is again noted consistent with superior sulcus tumor. In view of the CT the left upper and lingular lesions noted by CT are not visible by chest x-ray. No parenchymal infiltrate or effusion is seen. The heart is mildly enlarged. AICD lead is noted and median sternotomy sutures are present. There are degenerative changes in the lower thoracic spine with compression of T12 vertebral body, unchanged compared to the sagittal CT images. IMPRESSION: 1. No change in right apical lung lesion consistent with superior sulcus tumor. 2. No infiltrate or effusion. 3. Stable mild cardiomegaly with AICD lead. 4. Stable compression of T12 vertebral body. Electronically Signed   By: Windy Canny.D.  On: 05/02/2016 15:42   Dg Chest Portable 1 View  05/03/2016  CLINICAL DATA:  Post surgery, possible pneumothorax EXAM: PORTABLE CHEST 1 VIEW COMPARISON:  05/02/2016 FINDINGS: Cardiomediastinal silhouette is stable. Persistent soft tissue density in right apex medially. Status post CABG. Single lead cardiac pacemaker is unchanged in position. Mild basilar atelectasis. There is no evidence of pneumothorax. IMPRESSION: Persistent soft tissue density in right apex medially. Status post CABG. Single lead cardiac pacemaker is unchanged in position. Mild basilar atelectasis. There is no evidence of pneumothorax. Electronically Signed   By: Lahoma Crocker M.D.   On: 05/03/2016 14:27     Assessment/Plan: S/P Procedure(s) (LRB): MEDIASTINOSCOPY (N/A) VIDEO BRONCHOSCOPY WITH ENDOBRONCHIAL ULTRASOUND (N/A)  1 He appears stable, with some new hoarseness- no trouble with swallowing food or liquids 2 CXR is stable 3 discharge to home      George Pollard E 05/04/2016

## 2016-05-04 NOTE — Op Note (Signed)
NAMEAMONTE, George Pollard NO.:  [id number]  MEDICAL RECORD NO.:  [id number]  LOCATION:  3S10C                        FACILITY:  [facility]  PHYSICIAN:  Ivin Poot, M.D.  DATE OF BIRTH:  Apr 27, 1933  DATE OF PROCEDURE:  05/03/2016 DATE OF DISCHARGE:                              OPERATIVE REPORT   PREOPERATIVE DIAGNOSIS:  Right Pancoast tumor with severe nerve root pain from involvement of T1, C7, and C6 nerve roots.  POSTOPERATIVE DIAGNOSIS:  Right Pancoast tumor with severe nerve root pain from involvement of T1, C7, and C6 nerve roots.  OPERATION: 1. Video bronchoscopy. 2. Transbronchial ultrasound guided biopsy of 4R lymph node. 3. Mediastinoscopy with direct biopsy of 2R lymph nodes.  ANESTHESIA:  General.  INDICATIONS:  The patient is an 82 year old reformed smoker with significant heart disease, status post CABG with biventricular dysfunction and chronic atrial fibrillation.  He has had progressive right shoulder and arm pain for several months and recently, he was diagnosed with a Pancoast tumor by CT scan of chest.  I saw the patient, did discuss biopsies, status tissue diagnosis, so he received a palliative radiation therapy as the tumor appears to be very invasive into the trachea, esophagus, and upper mediastinum posteriorly as well as into the spinal canal.  I discussed bronchoscopy, transbronchial ultrasound-guided biopsy, and mediastinoscopy.  I discussed the use of general anesthesia, location of the surgical incisions, the plan to keep the patient at least overnight for observation due to his underlying cardiac disease, as well as the risks of the procedure including pneumothorax, bleeding requiring blood transfusion or emergency sternotomy, and infection and death.  He demonstrated understanding and agreed to proceed with surgery under what I felt was an informed consent.  PROCEDURE IN DETAIL:  The patient was brought to the operating room  and placed supine on the operating room table.  After an A-line had been placed by the Anesthesia team, he was intubated carefully.  Through the endotracheal tube, a video scope was passed.  The distal trachea showed no evidence of invasion or mucosal erosion of the tumor.  The mainstem bronchus to the left lung and the segments of the left upper lobe and left lower lobe were individually examined.  There were no endobronchial lesions.  The bronchoscope was passed on the right mainstem bronchus, which was normal.  There is no deformity or endobronchial lesions of the segments of the right upper lobe, right middle, or right lower lobe.  The bronchoscope was withdrawn, and the EBUS scope was inserted.  The mass posterior to the trachea was identified and several transtracheal aspirates were obtained.  Quick prep stain on three of the aspirates returned normal bronchial mucosa.  Remainders were sent for permanent assessment.  The bronchoscope was withdrawn.  Next, the patient was re-prepped and draped for a mediastinoscopy.  A proper time-out was performed.  A small incision was made at the base of neck.  The pretracheal plane was entered, and the scope was passed along the anterior and right lateral aspect to the trachea.  The posterior part of the trachea was adherent to the spine, and it was difficult to get posterior to the trachea.  I biopsied  several lymph nodes in the 2R station and submitted them for Pathology.  There was some bleeding despite using the finder needle to aspirate the mediastinal structures prior to biopsy.  This was controlled with local compression and topical thrombostatic agents.  When the hemostasis was achieved, the incision was closed in layers using Vicryl, and a sterile dressing was applied.  The patient was extubated.  I had a chest x-ray taken in the operating room, which showed no pneumothorax or changes in mediastinum.  The patient returned to recovery  room.     Ivin Poot, M.D.     PV/MEDQ  D:  05/03/2016  T:  05/04/2016  Job:  [id number]

## 2016-05-04 NOTE — Progress Notes (Signed)
Reviewed discharge paperwork with pt and spouse including medications, follow up appts and incision care. Pt and spouse verbalize understanding. Pt will be discharged in wheelchair. Consuelo Pandy RN

## 2016-05-07 ENCOUNTER — Encounter: Payer: Self-pay | Admitting: Cardiothoracic Surgery

## 2016-05-07 ENCOUNTER — Encounter (HOSPITAL_COMMUNITY): Payer: Self-pay | Admitting: Cardiothoracic Surgery

## 2016-05-07 ENCOUNTER — Other Ambulatory Visit: Payer: Self-pay | Admitting: *Deleted

## 2016-05-07 DIAGNOSIS — C3411 Malignant neoplasm of upper lobe, right bronchus or lung: Secondary | ICD-10-CM

## 2016-05-10 ENCOUNTER — Encounter: Payer: Self-pay | Admitting: Radiation Oncology

## 2016-05-10 ENCOUNTER — Encounter (HOSPITAL_COMMUNITY)
Admission: RE | Admit: 2016-05-10 | Discharge: 2016-05-10 | Disposition: A | Discharge status: Home / Self Care | Payer: Medicare Other | Source: Ambulatory Visit | Attending: Cardiothoracic Surgery | Admitting: Cardiothoracic Surgery

## 2016-05-10 ENCOUNTER — Telehealth: Payer: Self-pay | Admitting: Radiation Oncology

## 2016-05-10 ENCOUNTER — Ambulatory Visit
Admission: RE | Admit: 2016-05-10 | Discharge: 2016-05-10 | Disposition: A | Discharge status: Home / Self Care | Payer: Medicare Other | Source: Ambulatory Visit | Attending: Radiation Oncology | Admitting: Radiation Oncology

## 2016-05-10 VITALS — BP 123/71 | HR 87 | Temp 98.0°F | Resp 16 | Ht 70.0 in | Wt 212.2 lb

## 2016-05-10 DIAGNOSIS — C3411 Malignant neoplasm of upper lobe, right bronchus or lung: Secondary | ICD-10-CM | POA: Insufficient documentation

## 2016-05-10 DIAGNOSIS — C7951 Secondary malignant neoplasm of bone: Secondary | ICD-10-CM | POA: Diagnosis not present

## 2016-05-10 DIAGNOSIS — R918 Other nonspecific abnormal finding of lung field: Secondary | ICD-10-CM | POA: Diagnosis present

## 2016-05-10 DIAGNOSIS — Z510 Encounter for antineoplastic radiation therapy: Secondary | ICD-10-CM | POA: Insufficient documentation

## 2016-05-10 HISTORY — DX: Malignant neoplasm of unspecified part of unspecified bronchus or lung: C34.90

## 2016-05-10 LAB — GLUCOSE, CAPILLARY: Glucose-Capillary: 216 mg/dL — ABNORMAL HIGH (ref 65–99)

## 2016-05-10 MED ORDER — FLUDEOXYGLUCOSE F - 18 (FDG) INJECTION
9.5000 | Freq: Once | INTRAVENOUS | Status: AC | PRN
  Administered 2016-05-10: 9.5 via INTRAVENOUS

## 2016-05-10 NOTE — Progress Notes (Signed)
Radiation Oncology         [phone] ________________________________  Initial Outpatient Consultation  Name: George Pollard MRN: [id number]  Date: 05/10/2016  DOB: 06-Oct-1933  IH:KVQQ,VZDGLO, MD  Monico Blitz, MD   REFERRING PHYSICIAN: Monico Blitz, MD  DIAGNOSIS: The encounter diagnosis was Lung mass.    ICD-9-CM ICD-10-CM   1. 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  HISTORY OF PRESENT ILLNESS::George Pollard is a 82 y.o. male who presents today for initial consultation. He started to have right shoulder pain after receiving a flu shot and pneumonia shot six months ago. His should became swollen after these two shots. He went to his PCP when he first noticed the pain. They believed it was arthritis and he was told to take Tylenol to manage the pain. Tylenol was not helping with the pain so he went to the emergency department at [facility] and had x-rays performed and was referred to Dr. Aline Brochure who gave him a shot in his shoulder. This shot did not help alleviate the pain. He had a CT scan 04/22/2016, revealing a right pancoast tumor and was referred to Dr. Whitney Muse. He had a lymph node biopsy 05/03/2016 and was benign. Dr. Whitney Muse has referred him to CT surgery for biopsy to establish histologic diagnosis prior to concurrent chemotherapy. He is here today to discuss radiation treatment options. He is accompanied by his daughter and wife.  PREVIOUS RADIATION THERAPY: No  PAST MEDICAL HISTORY:  has a past medical history of CAD (coronary artery disease); Atrial fibrillation ([place]); Hypertension; BPH (benign prostatic hyperplasia); PVD (peripheral vascular disease) ([place]); Systolic CHF ([place]); Diabetes mellitus ([place]); Ventricular tachycardia ([place]); AICD (automatic cardioverter/defibrillator) present; Shortness of breath dyspnea; Anxiety;  and Lung cancer ([place]).    PAST SURGICAL HISTORY: Past Surgical History  Procedure Laterality Date  . Coronary artery bypass graft      1997  . Abdominal aortic aneurysm repair      08/26/2000  . Cataract extraction    . Tonsillectomy    . Left heart catheterization with coronary/graft angiogram N/A 10/21/2012    Procedure: LEFT HEART CATHETERIZATION WITH Beatrix Fetters;  Surgeon: Burnell Blanks, MD;  Location: [facility] CATH LAB;  Service: Cardiovascular;  Laterality: N/A;  . Percutaneous coronary stent intervention (pci-s) N/A 10/26/2012    Procedure: PERCUTANEOUS CORONARY STENT INTERVENTION (PCI-S);  Surgeon: Sherren Mocha, MD;  Location: [facility] CATH LAB;  Service: Cardiovascular;  Laterality: N/A;  . Cardiac catheterization    . Mediastinoscopy N/A 05/03/2016    Procedure: MEDIASTINOSCOPY;  Surgeon: Ivin Poot, MD;  Location: [facility] OR;  Service: Thoracic;  Laterality: N/A;  . Video bronchoscopy with endobronchial ultrasound N/A 05/03/2016    Procedure: VIDEO BRONCHOSCOPY WITH ENDOBRONCHIAL ULTRASOUND;  Surgeon: Ivin Poot, MD;  Location: [place];  Service: Thoracic;  Laterality: N/A;    FAMILY HISTORY: family history includes Valvular heart disease in his daughter.  SOCIAL HISTORY:  reports that he quit smoking about 20 years ago. His smoking use included Cigarettes. He has a 40 pack-year smoking history. He has never used smokeless tobacco. He reports that he does not drink alcohol or use illicit drugs.  ALLERGIES: Review of patient's allergies indicates no known allergies.  MEDICATIONS:  Current Outpatient Prescriptions  Medication Sig Dispense Refill  . amiodarone (PACERONE) 200 MG tablet Take  1 tablet by mouth 2 (two) times daily.  0  . aspirin EC 81 MG tablet Take 81 mg by mouth daily.    Marland Kitchen atorvastatin (LIPITOR) 80 MG tablet take 1 tablet by mouth once daily 30 tablet 2  . dexamethasone (DECADRON) 2 MG tablet Take 1 tablet (2 mg total) by mouth 2 (two) times daily  with a meal. 30 tablet 0  . furosemide (LASIX) 40 MG tablet Take 1 tablet (40 mg total) by mouth 2 (two) times daily. 180 tablet 3  . lisinopril (PRINIVIL,ZESTRIL) 2.5 MG tablet take 1 tablet by mouth once daily 30 tablet 2  . metFORMIN (GLUCOPHAGE) 500 MG tablet Take 500 mg by mouth daily.   0  . Metoprolol Tartrate (LOPRESSOR) 50 MG tablet Take 1.5 tablets (75 mg total) by mouth 2 (two) times daily. 90 tablet 6  . nitroGLYCERIN (NITROSTAT) 0.4 MG SL tablet Place 1 tablet (0.4 mg total) under the tongue every 5 (five) minutes as needed for chest pain (up to 3 doses). 25 tablet 2  . oxyCODONE (OXY IR/ROXICODONE) 5 MG immediate release tablet Take 1-2 tablets (5-10 mg total) by mouth every 6 (six) hours as needed for severe pain. 30 tablet 0  . potassium chloride SA (K-DUR,KLOR-CON) 20 MEQ tablet Take 1 tablet by mouth daily.  0  . warfarin (COUMADIN) 5 MG tablet Take 1 tablet (5 mg total) by mouth daily at 6 PM. (Patient not taking: Reported on 05/10/2016) 30 tablet 0   No current facility-administered medications for this encounter.    REVIEW OF SYSTEMS:  A 15 point review of systems is documented in the electronic medical record. This was obtained by the nursing staff. However, I reviewed this with the patient to discuss relevant findings and make appropriate changes.  He is a former smoker and quit smoking in 1997. He had a diagnosis of emphysema years ago. He has pain in his right shoulder and axilla. He reports the pain is a 7 out of 10 despite taking Oxycodone IR 7.5 mg at 0600. He mentions he is unable to lay flat to sleep due to persistent shoulder pain. He mentions that the pain in his shoulder radiates to his hand causing weakness. He is taking Decadron 2 mg twice daily. He mentions that he has not noticed a difference in the pain since he started taking Decadron. He denies trouble breathing or shortness of breath. He denies hemoptysis. He has had leg weakness as well since the shoulder pain  started. He mentions the pain is worse when extending his arm or opening his pill bottles. He denies numbness in the trunk of his body. He mentions that his strength weakens throughout the day. His voice is hoarse since the biopsy. He reports he has to clear his throat often since the lymph node biopsy. He does have a defibrillator.   PHYSICAL EXAM:  height is '5\' 10"'$  (1.778 m) and weight is 212 lb 3.2 oz (96.253 kg). His oral temperature is 98 F (36.7 C). His blood pressure is 123/71 and his pulse is 87. His respiration is 16 and oxygen saturation is 99%.    In general this is a well appearing caucasian male in no acute distress. He is alert and oriented x4 and appropriate throughout the examination. HEENT reveals that the patient is normocephalic, atraumatic. EOMs are intact. PERRLA. Skin is intact without any evidence of gross lesions. Cardiovascular exam reveals a regular rate and rhythm, no clicks rubs or murmurs are auscultated. Chest is  clear to auscultation bilaterally except for some wheezing.  Lymphatic assessment is performed and does not reveal any adenopathy in the cervical, supraclavicular, axillary, or inguinal chains. Abdomen has active bowel sounds in all quadrants and is intact. The abdomen is soft, non tender, non distended. Lower extremities are negative for pretibial pitting edema, deep calf tenderness, cyanosis or clubbing. He denies pain on his spine with palpation. He has good range of motion in his right arm. He has pain when pushing his right arm downwards.  He has 5/5 strength in his left arm and 4/5 strength in his right arm. No pronator drift. No nerve level.  KPS = 80  100 - Normal; no complaints; no evidence of disease. 90   - Able to carry on normal activity; minor signs or symptoms of disease. 80   - Normal activity with effort; some signs or symptoms of disease. 37   - Cares for self; unable to carry on normal activity or to do active work. 60   - Requires occasional  assistance, but is able to care for most of his personal needs. 50   - Requires considerable assistance and frequent medical care. 34   - Disabled; requires special care and assistance. 36   - Severely disabled; hospital admission is indicated although death not imminent. 75   - Very sick; hospital admission necessary; active supportive treatment necessary. 10   - Moribund; fatal processes progressing rapidly. 0     - Dead  Karnofsky DA, Abelmann Blakeslee, Craver LS and Darwin JH [phone]) The use of the nitrogen mustards in the palliative treatment of carcinoma: with particular reference to bronchogenic carcinoma Cancer 1 [id number]  LABORATORY DATA:  Lab Results  Component Value Date   WBC 10.1 05/04/2016   HGB 10.5* 05/04/2016   HCT 35.1* 05/04/2016   MCV 90.0 05/04/2016   PLT 207 05/04/2016   Lab Results  Component Value Date   NA 134* 05/04/2016   K 4.2 05/04/2016   CL 98* 05/04/2016   CO2 29 05/04/2016   Lab Results  Component Value Date   ALT 25 05/02/2016   AST 27 05/02/2016   ALKPHOS 94 05/02/2016   BILITOT 1.4* 05/02/2016    RADIOGRAPHY: Dg Chest 2 View  05/02/2016  CLINICAL DATA:  History of coronary artery disease and mediastinoscopy, CABG EXAM: CHEST  2 VIEW COMPARISON:  CT chest of 04/29/2016 and chest x-ray of 02/28/2016 FINDINGS: The medial right apical lung lesion is again noted consistent with superior sulcus tumor. In view of the CT the left upper and lingular lesions noted by CT are not visible by chest x-ray. No parenchymal infiltrate or effusion is seen. The heart is mildly enlarged. AICD lead is noted and median sternotomy sutures are present. There are degenerative changes in the lower thoracic spine with compression of T12 vertebral body, unchanged compared to the sagittal CT images. IMPRESSION: 1. No change in right apical lung lesion consistent with superior sulcus tumor. 2. No infiltrate or effusion. 3. Stable mild cardiomegaly with AICD lead. 4. Stable  compression of T12 vertebral body. Electronically Signed   By: Ivar Drape M.D.   On: 05/02/2016 15:42   Dg Cervical Spine 2 Or 3 Views  04/17/2016  82 year old complains of shoulder pain Cervical spine 2 views AP and lateral C4 and C5 and C5 and C6 have bone spurs and joint space narrowing On the AP view the uncovertebral joint of C5 and C6 also has joint space narrowing Impression cervical spondylosis primarily  C5 and 6 with C4 and C5 also contributing  Ct Chest W Contrast  04/29/2016  CLINICAL DATA:  Pancoast tumor of right lung ([place]) history of aneurysm repair. EXAM: CT CHEST, ABDOMEN, AND PELVIS WITH CONTRAST TECHNIQUE: Multidetector CT imaging of the chest, abdomen and pelvis was performed following the standard protocol during bolus administration of intravenous contrast. CONTRAST:  145m ISOVUE-300 IOPAMIDOL (ISOVUE-300) INJECTION 61% COMPARISON:  CTA, 11/21/2015 FINDINGS: CT CHEST Neck base and axilla: There is abnormal soft tissue with associated bone resorption on the right at the cervical thoracic junction involving C7, T1 and T2. The soft tissue component at the cervical thoracic junction on the right contiguous with the involved vertebra, measures 3.4 x 1.6 cm. No other neck base abnormalities. Mediastinum and hila: Right posterior mediastinal mass, which may reflect a right upper lobe mass invading the mediastinum, measures 6.1 cm superior inferior by 4.7 x 4.9 cm transversely. The mass is contiguous with the right margin of the esophagus, which is displaced to the left. It is also contiguous with the posterior aspect of the trachea that has a mild anterior convex bulge. The mass is contiguous with the posterior margin of the central right subclavian artery. Lungs and pleura: There is irregular ground-glass opacity in a peribronchovascular distribution in the left upper lobe, centered on image 29, series 6, measuring 16 mm greatest transverse dimension. There is a solid lobulated nodule in the  left upper lobe, image 73, measuring 11 mm. Reticular type opacity is seen adjacent to the right medial upper lobe/mediastinal mass. There is linear/discoid type opacity in the lower lungs most consistent with atelectasis/ scarring. Mild to moderate changes of centrilobular emphysema are noted most evident in the upper lobes. No pleural effusion. No pneumothorax. There are changes from previous CABG surgery. CT ABDOMEN AND PELVIS Hepatobiliary: No liver mass or lesion. Small dependent gallstones. Gallbladder otherwise unremarkable. No bile duct dilation. Spleen and pancreas:  Unremarkable. Adrenal glands: Bilateral adrenal masses, larger on the right measuring 2.5 cm, similar to the prior CT. Kidneys, ureters, bladder: 11 cm midpole right renal cyst without change from the prior study. No other renal masses. No hydronephrosis. Ureters are normal in course and in caliber. Bladder is unremarkable. Prostate gland:  Enlarged but stable. Lymph nodes:  No adenopathy. Vascular: Diffuse aortic ectasia with atherosclerotic calcifications. No change from the prior study. Ascites:  None. Gastrointestinal:  Unremarkable.  Normal appendix visualized. MUSCULOSKELETAL As described on the chest, there is resorption of portions of the right C7, T1 and T2 vertebrae via the above described mass. No other osteolytic lesions. There is a stable moderate compression fracture of T12. IMPRESSION: 1. Pancoast tumor extending from the medial right upper lobe into the right posterior superior mediastinum. Mass measures 6.1 x 4.7 x 4.9 cm. Mass abuts and may invade the esophagus and posterior trachea. Mass abuts the central right subclavian artery. The mass causes erosion of the right anterior aspects of the C7, T1 and T2 vertebra. 2. There is an 11 mm nodule in the left upper lobe consistent with contralateral metastatic disease. A more ground-glass type area of opacity in the left upper lobe is noted that is likely scarring. No other  convincing lung metastatic disease or metastatic disease in the chest. 3. Bilateral adrenal masses are similar to the prior CT which suggests bilateral adenomas. Metastatic disease is not excluded but felt less likely. No other evidence of metastatic disease in the abdomen or pelvis. Electronically Signed   By: DDedra SkeensD.  On: 04/29/2016 17:17   Ct Cervical Spine Wo Contrast  04/22/2016  CLINICAL DATA:  Neck pain for 6 months. Right shoulder pain for 2 weeks. EXAM: CT CERVICAL SPINE WITHOUT CONTRAST TECHNIQUE: Multidetector CT imaging of the cervical spine was performed without intravenous contrast. Multiplanar CT image reconstructions were also generated. COMPARISON:  Radiographs dated 04/15/2016 FINDINGS: There is a Pancoast tumor at the right lung apex medially measuring approximately 5.5 x 3.6 cm in the axial plane. The inferior extent of the tumor is not apparent on this cervical CT scan. The mass extends into the mediastinum and deviates the trachea anteriorly and deviates the esophagus to the left. Fat planes between the mass and the trachea and the esophagus are obliterated. The tumor invades the C7, T1 and T2 vertebra with destruction portions of those vertebra. There is an area of lucency in the posterior were cortex is C6 which may also represent de tumor extension. Tumor extends into the right neural foramen at C7-T1 and at T1-2 and extends into the spinal canal at T1-2. There is tumor invasion of the posterior medial aspects of the right first and second ribs with destruction of a portion of the right transverse process of T1. There is abnormal soft tissue and some bone destruction in left mastoid air cells with disruption of the cortex just posterior to the external auditory canal. I suspect this represents metastatic disease. The left internal and external auditory canals and middle ear cavity are clear. There is degenerative disc disease primarily at C5-6 through T1-2 with bilateral  foraminal stenosis at C5-6 and right foraminal stenosis at C6-7. No supraclavicular adenopathy. IMPRESSION: 1. 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 2. Possible metastatic lesion in the left mastoid air cells. This is less likely a cholesteatoma. Electronically Signed   By: Lorriane Shire M.D.   On: 04/22/2016 13:17   Ct Abdomen Pelvis W Contrast  04/29/2016  CLINICAL DATA:  Pancoast tumor of right lung [facility]) history of aneurysm repair. EXAM: CT CHEST, ABDOMEN, AND PELVIS WITH CONTRAST TECHNIQUE: Multidetector CT imaging of the chest, abdomen and pelvis was performed following the standard protocol during bolus administration of intravenous contrast. CONTRAST:  17m ISOVUE-300 IOPAMIDOL (ISOVUE-300) INJECTION 61% COMPARISON:  CTA, 11/21/2015 FINDINGS: CT CHEST Neck base and axilla: There is abnormal soft tissue with associated bone resorption on the right at the cervical thoracic junction involving C7, T1 and T2. The soft tissue component at the cervical thoracic junction on the right contiguous with the involved vertebra, measures 3.4 x 1.6 cm. No other neck base abnormalities. Mediastinum and hila: Right posterior mediastinal mass, which may reflect a right upper lobe mass invading the mediastinum, measures 6.1 cm superior inferior by 4.7 x 4.9 cm transversely. The mass is contiguous with the right margin of the esophagus, which is displaced to the left. It is also contiguous with the posterior aspect of the trachea that has a mild anterior convex bulge. The mass is contiguous with the posterior margin of the central right subclavian artery. Lungs and pleura: There is irregular ground-glass opacity in a peribronchovascular distribution in the left upper lobe, centered on image 29, series 6, measuring 16 mm greatest transverse dimension. There is a solid lobulated nodule in the left upper  lobe, image 73, measuring 11 mm. Reticular type opacity is seen adjacent to the right medial upper lobe/mediastinal mass. There is linear/discoid  type opacity in the lower lungs most consistent with atelectasis/ scarring. Mild to moderate changes of centrilobular emphysema are noted most evident in the upper lobes. No pleural effusion. No pneumothorax. There are changes from previous CABG surgery. CT ABDOMEN AND PELVIS Hepatobiliary: No liver mass or lesion. Small dependent gallstones. Gallbladder otherwise unremarkable. No bile duct dilation. Spleen and pancreas:  Unremarkable. Adrenal glands: Bilateral adrenal masses, larger on the right measuring 2.5 cm, similar to the prior CT. Kidneys, ureters, bladder: 11 cm midpole right renal cyst without change from the prior study. No other renal masses. No hydronephrosis. Ureters are normal in course and in caliber. Bladder is unremarkable. Prostate gland:  Enlarged but stable. Lymph nodes:  No adenopathy. Vascular: Diffuse aortic ectasia with atherosclerotic calcifications. No change from the prior study. Ascites:  None. Gastrointestinal:  Unremarkable.  Normal appendix visualized. MUSCULOSKELETAL As described on the chest, there is resorption of portions of the right C7, T1 and T2 vertebrae via the above described mass. No other osteolytic lesions. There is a stable moderate compression fracture of T12. IMPRESSION: 1. Pancoast tumor extending from the medial right upper lobe into the right posterior superior mediastinum. Mass measures 6.1 x 4.7 x 4.9 cm. Mass abuts and may invade the esophagus and posterior trachea. Mass abuts the central right subclavian artery. The mass causes erosion of the right anterior aspects of the C7, T1 and T2 vertebra. 2. There is an 11 mm nodule in the left upper lobe consistent with contralateral metastatic disease. A more ground-glass type area of opacity in the left upper lobe is noted that is likely scarring. No other convincing lung  metastatic disease or metastatic disease in the chest. 3. Bilateral adrenal masses are similar to the prior CT which suggests bilateral adenomas. Metastatic disease is not excluded but felt less likely. No other evidence of metastatic disease in the abdomen or pelvis. Electronically Signed   By: Lajean Manes M.D.   On: 04/29/2016 17:17   Dg Chest Port 1 View  05/04/2016  CLINICAL DATA:  SOB. Hx of CAD, HTN, AND DM. Pt is a former smoker. Subsequent encounter. EXAM: PORTABLE CHEST 1 VIEW COMPARISON:  05/03/2016 FINDINGS: Cardiac silhouette is mildly enlarged. Stable changes from CABG surgery are noted. No mediastinal or hilar masses. Medial right upper lobe mass is less well visualized currently due to rotation to the right. There is no significant change. Lungs otherwise clear. No convincing pleural effusion or pneumothorax. IMPRESSION: 1. No convincing acute cardiopulmonary disease. No change from previous day's study. 2. Medial right upper lobe mass consistent with a Pancoast tumor. 3. State cardiomegaly. Electronically Signed   By: Lajean Manes M.D.   On: 05/04/2016 11:13   Dg Chest Portable 1 View  05/03/2016  CLINICAL DATA:  Post surgery, possible pneumothorax EXAM: PORTABLE CHEST 1 VIEW COMPARISON:  05/02/2016 FINDINGS: Cardiomediastinal silhouette is stable. Persistent soft tissue density in right apex medially. Status post CABG. Single lead cardiac pacemaker is unchanged in position. Mild basilar atelectasis. There is no evidence of pneumothorax. IMPRESSION: Persistent soft tissue density in right apex medially. Status post CABG. Single lead cardiac pacemaker is unchanged in position. Mild basilar atelectasis. There is no evidence of pneumothorax. Electronically Signed   By: Lahoma Crocker M.D.   On: 05/03/2016 14:27    IMPRESSION: George Pollard is a 82 yo male with a pancoast tumor in the right lung apex invading the mediastinum and the C7, T1, and T2 vertebra with extension into the neural foramina  and  into the spinal canal. He is a good candidate for external radiation depending on pathology.  PLAN: We discussed the role of radiation and its side effects. We discussed that he would need about 5-6 weeks of therapy. We discussed that paralyzation could happen if he does not choose to get treated. We discussed that the tumor is likely lung cancer. We discussed tapering the Decadron because he is not receiving benefits from the steroid and it could possibly cause the non-diagnostic biopsies. We gave him instructions to taper his Decadron.  He has a PET scan scheduled today. I will likely order a CT guided biopsy after the PET scan.  We discussed that if he is not able to move his arms or legs over the weekend, he needs to call us.   I spent 40 minutes minutes face to face with the patient and more than 50% of that time was spent in counseling and/or coordination of care.   ------------------------------------------------  Sheral Apley. Tammi Klippel, M.D.    This document serves as a record of services personally performed by Tyler Pita, MD and Shona Simpson, PAC. It was created on their behalf by Lendon Collar, a trained medical scribe. The creation of this record is based on the scribe's personal observations and the provider's statements to them. This document has been checked and approved by the attending provider.

## 2016-05-10 NOTE — Progress Notes (Signed)
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 Shoulders became swollen. George Pollard was given a shot in the ED but this didn't alleviate the pain.   Lymph node biopsy on 05/03/16 was benign. George Pollard is referring the patient to CT surgery for biopsy to establish histologic diagnosis prior to concurrent chemotherapy.  Tobacco/Marijuana/Snuff/ETOH use: Former smoker. Quit smoking in 1997.   Past/Anticipated interventions by cardiothoracic surgery, if any: referred to for biopsy  Past/Anticipated interventions by medical oncology, if any: concurrent therapy planned for after biopsy   Signs/Symptoms  Weight changes, if any: denies  Respiratory complaints, if any: dx with emphysema years ago. Denies cough. Reports George Pollard has to clear his throat often since the surgery (referencing lymph node biopsy). Denies shortness of breath. Hoarseness since lymph node biopsy.   Hemoptysis, if any: no  Pain issues, if any:  yes, both shoulders worse in right. Reports pain 7 on a scale of 0-10 despite taking oxycodone IR 7.5 mg at 0600. Reports George Pollard is unable to lay flat to sleep due to persistent shoulder pain.   SAFETY ISSUES:  Prior radiation? no  Pacemaker/ICD? Yes; defibrillator. Patient has a Oncologist. Patient fogot wallet today thus, doesn't have card to copy. Patient will bring card to next radiation oncology visit.   Possible current pregnancy?no  Is the patient on methotrexate? no  Current Complaints / other details:  82 year old male. Married with four children. Lives in [place] with his wife. Does carpentry as a hobby. NKDA. Patient reports constipation. Provided patient with constipation management handouts and reviewed pertinent information. Patient  and family verbalized understanding.

## 2016-05-10 NOTE — Telephone Encounter (Signed)
I called and spoke with the patient and his wife and daughter regarding his PET scan findings and the recommendations for CT guided biopsy. Dr. Tammi Klippel has placed orders for this, and again we reviewed precautions for when to call.

## 2016-05-14 ENCOUNTER — Other Ambulatory Visit: Payer: Self-pay | Admitting: *Deleted

## 2016-05-14 ENCOUNTER — Ambulatory Visit: Payer: Medicare Other | Admitting: Cardiothoracic Surgery

## 2016-05-14 DIAGNOSIS — R918 Other nonspecific abnormal finding of lung field: Secondary | ICD-10-CM

## 2016-05-15 ENCOUNTER — Encounter (HOSPITAL_BASED_OUTPATIENT_CLINIC_OR_DEPARTMENT_OTHER): Payer: Medicare Other | Admitting: Hematology & Oncology

## 2016-05-15 ENCOUNTER — Encounter (HOSPITAL_COMMUNITY): Payer: Self-pay | Admitting: Hematology & Oncology

## 2016-05-15 ENCOUNTER — Ambulatory Visit: Payer: Medicare Other | Admitting: Cardiothoracic Surgery

## 2016-05-15 VITALS — BP 118/101 | HR 81 | Temp 98.0°F | Resp 18 | Wt 212.7 lb

## 2016-05-15 DIAGNOSIS — C3411 Malignant neoplasm of upper lobe, right bronchus or lung: Secondary | ICD-10-CM | POA: Diagnosis not present

## 2016-05-15 DIAGNOSIS — Z87891 Personal history of nicotine dependence: Secondary | ICD-10-CM | POA: Diagnosis not present

## 2016-05-15 DIAGNOSIS — R531 Weakness: Secondary | ICD-10-CM

## 2016-05-15 DIAGNOSIS — R918 Other nonspecific abnormal finding of lung field: Secondary | ICD-10-CM

## 2016-05-15 MED ORDER — PROCHLORPERAZINE MALEATE 10 MG PO TABS: 10.0000 mg | ORAL_TABLET | Freq: Four times a day (QID) | ORAL | Status: DC | PRN

## 2016-05-15 MED ORDER — ONDANSETRON HCL 8 MG PO TABS: 8.0000 mg | ORAL_TABLET | Freq: Three times a day (TID) | ORAL | Status: DC | PRN

## 2016-05-15 MED ORDER — FENTANYL 12 MCG/HR TD PT72: 12.0000 ug | MEDICATED_PATCH | TRANSDERMAL | Status: DC

## 2016-05-15 MED ORDER — HYDROCODONE-ACETAMINOPHEN 10-325 MG PO TABS: 1.0000 | ORAL_TABLET | ORAL | Status: DC | PRN

## 2016-05-15 NOTE — Progress Notes (Signed)
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 abuts and may invade esoph and post trachea, abuts central R subclavian artery, erosion of the R anterior aspects of the C7, T1 and T2 vertebra   05/03/2016 Pathology Results Not diagnostic   05/03/2016 Procedure Video bronchoscopy, Transbronchial ultrasound guided biopsy of 4R LN, mediastinoscopy with direct biopsy of 2R LN   05/10/2016 PET scan intensely hypermetabolic RUL mass invading mediastinum c/w pancoast tumor. 2 LUL pulmonary nodules. Bilateral adrenal adenomas    HISTORY OF PRESENTING ILLNESS:  George Pollard 82 y.o. male is here because of referral from Dr. Aline Pollard for abnormal CT scan results showing a R pancoast tumor.   George Pollard is accompanied by his wife and daughter. I personally reviewed and went over PET/CT with the patient. I spoke with the patient and his family about staging based on current imaging. In addition we reviewed pathology from his mediastinoscopy and bronchoscopy.   The patient consulted with radiation oncology, Dr. Tammi Klippel, on 05/10/2016. He will undergo CT Biopsy at [facility] on Friday, 05/17/2016.  He is concerned about the quality of life he'll have with treatment and after. If chemotherapy will not extend his life, he would not like to proceed with treatment that makes him sick during the time he has left.   The patient continues to experience pain in his right shoulder/axilla area. He takes oxycodone. It offers him some relief.    His wife reports his speech change has improved slightly as he speaks with a louder whisper. They note his hoarseness started after his "procedure."  MEDICAL HISTORY:  Past Medical History  Diagnosis Date  . CAD (coronary artery disease)     a. CABG 1997. b. NSTEMI (troponin >20) s/p PTCA/DES to LCx 10/21/12, overlapping DES to mid & distal RCA 10/26/12; c. 02/2016 MV: EF 37%, large fixed defect - apex, inf, septal, lat wall, no ischemia.  . Atrial fibrillation ([place])   . Hypertension   . BPH (benign prostatic hyperplasia)   . PVD (peripheral vascular disease) ([place])     a. AAA repair 2001.  Marland Kitchen Systolic CHF ([place])     a. 10/2012: acute on chronic systolic CHF / pulmonary edema secondary to severe mixed cardiomyopathy;  b. 02/2016 Echo: EF 30-35%, sev dil RV, RV dysfxn, massively dil RA, PASP 58mHg.  . Diabetes mellitus ([place]     a. Noted 10/2012 (prior hx of DM resolved with weight loss).  . Ventricular tachycardia ([place]     a. 02/2016 4 ICD shocks for VT-->Amio added.  .Marland KitchenAICD (automatic cardioverter/defibrillator) present   . Shortness of breath dyspnea   . Anxiety   . Lung cancer ([place]     pancoast tumor right lung apex invading mediastinum and C7, T1, T2 with extension into the neural foramina and into the spinal canal    SURGICAL HISTORY: Past Surgical History  Procedure Laterality Date  . Coronary artery bypass graft      1997  . Abdominal aortic aneurysm repair  08/26/2000  . Cataract extraction    . Tonsillectomy    . Left heart catheterization with coronary/graft angiogram N/A 10/21/2012    Procedure: LEFT HEART CATHETERIZATION WITH Beatrix Fetters;  Surgeon: Burnell Blanks, MD;  Location: [facility] CATH LAB;  Service: Cardiovascular;  Laterality: N/A;  . Percutaneous coronary stent intervention (pci-s) N/A 10/26/2012    Procedure: PERCUTANEOUS CORONARY STENT INTERVENTION (PCI-S);  Surgeon: Sherren Mocha, MD;  Location: [facility] CATH LAB;  Service: Cardiovascular;   Laterality: N/A;  . Cardiac catheterization    . Mediastinoscopy N/A 05/03/2016    Procedure: MEDIASTINOSCOPY;  Surgeon: Ivin Poot, MD;  Location: [facility];  Service: Thoracic;  Laterality: N/A;  . Video bronchoscopy with endobronchial ultrasound N/A 05/03/2016    Procedure: VIDEO BRONCHOSCOPY WITH ENDOBRONCHIAL ULTRASOUND;  Surgeon: Ivin Poot, MD;  Location[facility];  Service: Thoracic;  Laterality: N/A;    SOCIAL HISTORY: Social History   Social History  . Marital Status: Married    Spouse Name: N/A  . Number of Children: 4  . Years of Education: N/A   Occupational History  . Not on file.   Social History Main Topics  . Smoking status: Former Smoker -- 2.00 packs/day for 20 years    Types: Cigarettes    Quit date: 12/17/1995  . Smokeless tobacco: Never Used     Comment: Quit 1997  . Alcohol Use: No  . Drug Use: No  . Sexual Activity: No   Other Topics Concern  . Not on file   Social History Narrative   Lives in [place].  Lives with wife.     Married 10 years 4 children 8 grandchildren 56 great-grandchildren Ex smoker, quit in 1997. He was a heavy smoker, at least 2 ppd. Started at [age] yo. Worked most years at a [employer]. Did some tobacco farming. 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  He does carpentry as a hobby.   FAMILY HISTORY: Family History  Problem Relation Age of Onset  . Valvular heart disease Daughter     MVP repair   Father died at 68 yo. Received artifical knee replacement and fell after which broke it up. He had it replaced then fell again. At this point he just sat and gained weight. Alzheimer's. Had an aneurysm. Mother died at 38 yo. She had a heart attack in her 33s. Congestive heart failure. 2 brothers and 2 sisters total siblings. All 4 siblings have had aneurysms. 3 had surgeries, and 1 was too unhealthy for surgery. All were smokers.  2 brothers are still living.  ALLERGIES:  has No Known  Allergies.  MEDICATIONS:  Current Outpatient Prescriptions  Medication Sig Dispense Refill  . amiodarone (PACERONE) 200 MG tablet Take 1 tablet by mouth 2 (two) times daily.  0  . aspirin EC 81 MG tablet Take 81 mg by mouth daily.    Marland Kitchen atorvastatin (LIPITOR) 80 MG tablet take 1 tablet by mouth once daily 30 tablet 2  . dexamethasone (DECADRON) 2 MG tablet Take 1 tablet (2 mg total) by mouth 2 (two) times daily with a meal. 30 tablet 0  . furosemide (LASIX) 40 MG tablet Take 1 tablet (40 mg total) by mouth 2 (two) times daily. 180 tablet 3  . lisinopril (PRINIVIL,ZESTRIL) 2.5 MG tablet take 1 tablet by mouth once daily 30 tablet 2  . metFORMIN (GLUCOPHAGE) 500 MG tablet Take 500 mg by mouth daily.   0  . Metoprolol Tartrate (LOPRESSOR) 50  MG tablet Take 1.5 tablets (75 mg total) by mouth 2 (two) times daily. 90 tablet 6  . nitroGLYCERIN (NITROSTAT) 0.4 MG SL tablet Place 1 tablet (0.4 mg total) under the tongue every 5 (five) minutes as needed for chest pain (up to 3 doses). 25 tablet 2  . oxyCODONE (OXY IR/ROXICODONE) 5 MG immediate release tablet Take 1-2 tablets (5-10 mg total) by mouth every 6 (six) hours as needed for severe pain. 30 tablet 0  . potassium chloride SA (K-DUR,KLOR-CON) 20 MEQ tablet Take 1 tablet by mouth daily.  0  . warfarin (COUMADIN) 5 MG tablet Take 1 tablet (5 mg total) by mouth daily at 6 PM. 30 tablet 0   No current facility-administered medications for this visit.    Review of Systems  Constitutional: Positive for malaise/fatigue. Negative for weight loss.  HENT: Negative.   Eyes: Negative.   Respiratory: Negative.   Cardiovascular: Positive for leg swelling.       Occasional left leg swelling, managed with compression socks.  Gastrointestinal: Negative.   Genitourinary: Negative.   Musculoskeletal:       Consistent Right shoulder pain.  Skin: Negative.   Neurological: Positive for speech change, focal weakness and weakness.       Right arm and bilateral  leg weakness.  Endo/Heme/Allergies: Negative.   Psychiatric/Behavioral: Negative.   All other systems reviewed and are negative.  14 point ROS was done and is otherwise as detailed above or in HPI   PHYSICAL EXAMINATION: ECOG PERFORMANCE STATUS: 1 - Symptomatic but completely ambulatory  Filed Vitals:   05/15/16 1104  BP: 118/101  Pulse: 81  Temp: 98 F (36.7 C)  Resp: 18   Filed Weights   05/15/16 1104  Weight: 212 lb 11.2 oz (96.48 kg)    Physical Exam  Constitutional: He is oriented to person, place, and time and well-developed, well-nourished, and in no distress.  HENT:  Head: Normocephalic and atraumatic.  Mouth/Throat: Oropharynx is clear and moist.  Eyes: Conjunctivae and EOM are normal. Pupils are equal, round, and reactive to light. Right eye exhibits no discharge. Left eye exhibits no discharge. No scleral icterus.  Neck: Normal range of motion. Neck supple. No JVD present. No tracheal deviation present. No thyromegaly present.  Cardiovascular: Normal rate, regular rhythm and normal heart sounds.   Pulmonary/Chest: Effort normal and breath sounds normal. No respiratory distress. He has no wheezes.  Abdominal: Soft. Bowel sounds are normal. He exhibits no distension and no mass. There is no tenderness. There is no rebound and no guarding.  Musculoskeletal: Normal range of motion. He exhibits no edema.  Compression hose LE bilaterally, muscle atrophy R scapular region  Lymphadenopathy:    He has no cervical adenopathy.  Neurological: He is alert and oriented to person, place, and time. He displays normal reflexes. No cranial nerve deficit. Gait normal. Coordination normal.  Right sided weakness. Weak hip flexors.  Skin: Skin is warm and dry.  Psychiatric: Memory, affect and judgment normal.  Nursing note and vitals reviewed.   LABORATORY DATA:  I have reviewed the data as listed Lab Results  Component Value Date   WBC 10.1 05/04/2016   HGB 10.5* 05/04/2016    HCT 35.1* 05/04/2016   MCV 90.0 05/04/2016   PLT 207 05/04/2016   CMP     Component Value Date/Time   NA 134* 05/04/2016 0537   K 4.2 05/04/2016 0537   CL 98* 05/04/2016 0537   CO2 29 05/04/2016 0537   GLUCOSE 125*  05/04/2016 0537   BUN 22* 05/04/2016 0537   CREATININE 0.94 05/04/2016 0537   CALCIUM 9.1 05/04/2016 0537   PROT 6.9 05/02/2016 1214   ALBUMIN 3.3* 05/02/2016 1214   AST 27 05/02/2016 1214   ALT 25 05/02/2016 1214   ALKPHOS 94 05/02/2016 1214   BILITOT 1.4* 05/02/2016 1214   GFRNONAA >60 05/04/2016 0537   GFRAA >60 05/04/2016 0537     RADIOGRAPHIC STUDIES: I have personally reviewed the radiological images as listed and agreed with the findings in the report. No results found.  Study Result     CLINICAL DATA: Initial treatment strategy for RIGHT upper lobe and the mediastinal mass.  EXAM: NUCLEAR MEDICINE PET SKULL BASE TO THIGH  TECHNIQUE: 9.5 mCi F-18 FDG was injected intravenously. Full-ring PET imaging was performed from the skull base to thigh after the radiotracer. CT data was obtained and used for attenuation correction and anatomic localization.  FASTING BLOOD GLUCOSE: Value: 211 mg/dl  COMPARISON: CT thorax 04/29/2016  FINDINGS: NECK  No hypermetabolic lymph nodes in the neck.  CHEST  RIGHT upper lobe mass invading the mediastinum is intensely metabolic with SUV max equals 16.4. The metabolic activity is relatively homogeneous throughout the mass lesion. The lesion measures 4.8 by a 4.1 cm and axial dimension. The mass deviates the trachea and esophagus leftward.  There are no hypermetabolic mediastinal lymph nodes.  Lobular nodule in the LEFT upper lobe measuring 10 mm (image 76, series 4) with SUV max equal 5.8. Ground-glass nodule in the LEFT upper lobe measuring 15 mm also has associated metabolic activity with SUV max of 2.7 which is high for the low degree of cellularity of this lesion.  Of note, there is a  well-circumscribed new lesion in the RIGHT lower paratracheal station measuring 3.5 x 2.4 cm with thick rim and internal gas and semi-solid material (image 67, series 4). This presumably represents a post procedural collection following a bronchoscopy and lymph node sampling. There is no significant metabolic activity associated with this lesion which would indicate that lesion is not superinfected.  ABDOMEN/PELVIS  There is thickening of LEFT and RIGHT adrenal gland. Adrenal glands have low-attenuation consists with adenomas. Neither of these enlarged gland significant metabolic activity above background liver activity. The RIGHT gland does have a focus nodularity with higher density.  No abnormal metabolic activity liver. No hypermetabolic abdominal pelvic lymph nodes. Atherosclerotic calcification aorta.  SKELETON  No focal hypermetabolic activity to suggest skeletal metastasis.  IMPRESSION: 1. Intensely hypermetabolic RIGHT upper lobe mass invading the mediastinum consistent with malignant Pancoast tumor. Metabolic activity is uniform throughout the lesion. 2. No evidence of metastatic adenopathy in the mediastinum. 3. Two LEFT upper lobe pulmonary nodules are concerning for metastatic disease. 4. New gas and semi-solid tissue collection in the RIGHT lower paratracheal location is presumed sequelae of the bronchoscopy and lymph node sampling. This collection is rather large at 3.5 cm but no significant metabolic activity which suggests a sterile collection. No pneumothorax. 5. Bilateral adrenal adenomas. These results will be called to the ordering clinician or representative by the Radiologist Assistant, and communication documented in the PACS or zVision Dashboard.   Electronically Signed  By: Suzy Bouchard M.D.  On: 05/10/2016 14:50     ASSESSMENT & PLAN:  R Pancoast Tumor CT Cervical Spine 04/22/2016 showed a pancoast tumor in the right lung apex  invading the mediastinum and the C7, T1 and T2 vertebra with extension into the neural foramina and into the spinal canal.  Consistent Right  shoulder pain for the last 6 months R arm/hand weakness History of tobacco use  We reviewed staging.  I advised the patient and family that if findings in the Left lung are malignant that staging is c/w stage IV disease. Otherwise stage III.  I discussed his case with Dr. Tammi Klippel he has agreed to see the patient on Friday for simulation. We are hoping to obtain a diagnosis on Friday. I am greatly concerned that if a diagnosis is not obtained on Friday that additional delay in therapy is going to put him at increased risk of complications from his disease. We discussed that although certainly NOT ideal if biopsy is non-diagnostic we may need to proceed with therapy given the serious nature of his disease.  We will tentatively schedule him for a PICC line next week in anticipation of starting chemotherapy next week. For port placement we can arrange locally once therapy starts if needed. RTC 1 week.   All questions were answered. The patient knows to call the clinic with any problems, questions or concerns.  This document serves as a record of services personally performed by Ancil Linsey, MD. It was created on her behalf by Arlyce Harman, a trained medical scribe. The creation of this record is based on the scribe's personal observations and the provider's statements to them. This document has been checked and approved by the attending provider.  I have reviewed the above documentation for accuracy and completeness, and I agree with the above.  This note was electronically signed.    Molli Hazard, MD  05/15/2016 11:11 AM

## 2016-05-15 NOTE — Patient Instructions (Addendum)
[place] at [facility] Discharge Instructions  RECOMMENDATIONS MADE BY THE CONSULTANT AND ANY TEST RESULTS WILL BE SENT TO YOUR REFERRING PHYSICIAN.  When you have a biopsy the Interventional Radiologist can NOT put a port in the same day.   We will put a PICC line in you and get you started on treatment with the drugs: Carboplatin and Taxol. The PICC line can be placed @ [place] will also have radiation while be treated with chemo. Dr. Tammi Klippel will get you in on Friday and have you simulated - this means--get you ready for radiation.  You will have 2 drugs called into your pharmacy: Zofran (ondansetron) and Compazine (prochlorperazine) for nausea. Once you get a port a cath in place we will order you some EMLA cream to put on your skin over your port site prior to chemo.  If they don't get enough tissue @ your biopsy appointment on Friday then we are going to continue with treating you b/c we don't want to delay you. The actual diagnosis is not going to change the drugs that we are going to use. Both Dr. Tammi Klippel and Dr. Whitney Muse agree to this treatment method.   We are putting you on a pain patch called Fentanyl. You will wear this patch for 72 hours. It lasts for 3 days/72 hours. If in 3 days you are not noticing an improvement of your pain please take the old patch off and throw away. Then place 2 new patches on your skin. This will increase your dosage of Fentanyl from 12 to 77mg.  You will need chemo teaching - this [facility] This starts @ 11 and lasts until 1230 or before. When you come in go to the [employer](information desk) and let them know that you need to go to chemo teaching [place]- they will direct you where to go.   You will start chemo next week probably starting on Thursday.   You may call Amy our scheduler and have her contact me, HHildred Alamin for any  questions/concerns.  [phone] See Dr. PWhitney Museon Wednesday June 7 @ 9;30 and follow this with chemo teaching the same day.   [facility].  To afford each patient quality time with our provider, please arrive at least 15 minutes before your scheduled appointment time.   Beginning January 23rd 2017 lab work for the [facility] in the  [facility] lab [facility]. If you have a lab appointment with the [facility] thru the  Main Entrance and check in at the main information desk  You need to re-schedule your appointment should you arrive 10 or more minutes late.  We strive to give you quality time with our providers, and arriving late affects you and other patients whose appointments are after yours.  Also, if you no show three or more times for appointments you may be dismissed from the clinic at the providers discretion.     [facility], [facility]  Our hope is that these requests will decrease the amount of time that you wait before being seen by our physicians.       _____________________________________________________________  Should you have questions  after your visit to [facility], please contact our office at [phone] between the hours of 8:30 a.m. and 4:30 p.m.  Voicemails left after 4:30 p.m. will not be returned until the following business day.  For prescription refill requests, have your pharmacy contact our office.         Resources For Cancer Patients and their Caregivers ? American Cancer Society: Can assist with transportation, wigs, general needs, runs Look Good Feel Better.        818-201-2301 ? Cancer Care: Provides financial assistance, online support groups, medication/co-pay assistance.  1-800-813-HOPE (618)287-9614) ? [place] Assists [place] cancer patients and their families through emotional , educational and financial support.  (223) 757-2861 ? [place] DSS Where to apply for food stamps, Medicaid and utility assistance. 867-476-6220 ? [facility]: Transportation to medical appointments. (513)809-8629 ? Social Security Administration: May apply for disability if have a Stage IV cancer. 604-070-0016 580-880-4905 ? [employer], Disability and Transit Services: Assists with nutrition, care and transit needs. [place] Support Programs: '@10RELATIVEDAYS'$ @ > Cancer Support Group  2nd Tuesday of the month 1pm-2pm, Journey Room  > Creative Journey  3rd Tuesday of the month 1130am-1pm, Journey Room  > Look Good Feel Better  1st Wednesday of the month 10am-12 noon, Journey Room (Call American Cancer Society to register 9183002613)  PICC Insertion A peripherally inserted central catheter (PICC) is a long, thin, flexible tube that is inserted into a vein in the upper arm. It is a form of IV access. It is considered to be a "central" line because the tip of the PICC ends in a large vein in your chest. This large vein is called the superior vena cava (SVC). The PICC tip ends in the SVC because there is a lot of blood flow in the SVC. This allows medicines and IV fluids to be quickly distributed throughout the body. The PICC is inserted using a sterile technique by a specially trained health care provider. After the PICC is inserted, a chest X-ray may be done to ensure that it is in the correct place.  LET [facility] CARE PROVIDER KNOW ABOUT:   Any allergies you have.  All medicines you are taking, including vitamins, herbs, eye drops, creams, and over-the-counter medicines.  Previous problems you or members of your family have had with the use of anesthetics.  Any blood disorders you have.  Previous surgeries you have had.  Medical conditions you have. RISKS AND COMPLICATIONS Generally, this is a  safe procedure. However, as with any procedure, complications can occur. Possible complications include:  Infection at the insertion site or in the blood.  Bleeding at the insertion site or internally.  Injury or collapse of the lung.  Movement or malposition of the PICC.  Inflammation of the vein (phlebitis).  Nerve injury or irritation.  Clot formation at the tip of the PICC line.  Blood clot in the lung (pulmonary embolus).  Injury to the large blood vessels or heart (rare). BEFORE THE PROCEDURE   Your health care provider may want you to have blood tests. These tests can help tell how well your blood clots.  If you take blood thinners (anticoagulant medicine), ask your health care provider if you should stop taking them. PROCEDURE   You will have to lie flat for about 30-45 minutes for this procedure.  Your health care provider will start by identifying a vein into which the PICC line will be placed.  This is done using ultrasound or X-ray guidance.  Medicine is used to numb the skin around the insertion site.  The skin where the catheter will be inserted is cleaned and covered with a sterile surgical drape.  A small needle is inserted into the vein, and then a small guidewire is advanced into the superior vena cava.  The catheter is then advanced over the guidewire and moved into position. The guidewire is then removed.  The catheter is flushed and blood is drawn back to confirm it is in the vein.  If this is done without X-ray guidance, an X-ray will be needed to make sure the catheter tip is in the correct position.  Once the placement is confirmed, the PICC is secured to the skin with a device and covered with a sterile dressing. AFTER THE PROCEDURE  You should avoid any strenuous activity for the next 2 days or as directed by your health care provider.  You will be allowed to go back to your regular activities after the procedure.  You will be instructed on  the care of your PICC line.   This information is not intended to replace advice given to you by your health care provider. Make sure you discuss any questions you have with your health care provider.   Document Released: 09/22/2013 Document Revised: 12/07/2013 Document Reviewed: 09/22/2013 Elsevier Interactive Patient Education 2016 [place] An implanted port is a type of central line that is placed under the skin. Central lines are used to provide IV access when treatment or nutrition needs to be given through a person's veins. Implanted ports are used for long-term IV access. An implanted port may be placed because:   You need IV medicine that would be irritating to the small veins in your hands or arms.   You need long-term IV medicines, such as antibiotics.   You need IV nutrition for a long period.   You need frequent blood draws for lab tests.   You need dialysis.  Implanted ports are usually placed in the chest area, but they can also be placed in the upper arm, the abdomen, or the leg. An implanted port has two main parts:   Reservoir. The reservoir is round and will appear as a small, raised area under your skin. The reservoir is the part where a needle is inserted to give medicines or draw blood.   Catheter. The catheter is a thin, flexible tube that extends from the reservoir. The catheter is placed into a large vein. Medicine that is inserted into the reservoir goes into the catheter and then into the vein.  HOW WILL I CARE FOR MY INCISION SITE? Do not get the incision site wet. Bathe or shower as directed by your health care provider.  HOW IS MY PORT ACCESSED? Special steps must be taken to access the port:   Before the port is accessed, a numbing cream can be placed on the skin. This helps numb the skin over the port site.   Your health care provider uses a sterile technique to access the port.  Your health care provider must put  on a mask and sterile gloves.  The skin over your port is cleaned carefully with an antiseptic and allowed to dry.  The port is gently pinched between sterile gloves, and a needle is inserted into the port.  Only "non-coring" port needles should be used to access the port. Once the port is accessed, a blood return  should be checked. This helps ensure that the port is in the vein and is not clogged.   If your port needs to remain accessed for a constant infusion, a clear (transparent) bandage will be placed over the needle site. The bandage and needle will need to be changed every week, or as directed by your health care provider.   Keep the bandage covering the needle clean and dry. Do not get it wet. Follow your health care provider's instructions on how to take a shower or bath while the port is accessed.   If your port does not need to stay accessed, no bandage is needed over the port.  WHAT IS FLUSHING? Flushing helps keep the port from getting clogged. Follow your health care provider's instructions on how and when to flush the port. Ports are usually flushed with saline solution or a medicine called heparin. The need for flushing will depend on how the port is used.   If the port is used for intermittent medicines or blood draws, the port will need to be flushed:   After medicines have been given.   After blood has been drawn.   As part of routine maintenance.   If a constant infusion is running, the port may not need to be flushed.  HOW LONG WILL MY PORT STAY IMPLANTED? The port can stay in for as long as your health care provider thinks it is needed. When it is time for the port to come out, surgery will be done to remove it. The procedure is similar to the one performed when the port was put in.  WHEN SHOULD I SEEK IMMEDIATE MEDICAL CARE? When you have an implanted port, you should seek immediate medical care if:   You notice a bad smell coming from the incision site.    You have swelling, redness, or drainage at the incision site.   You have more swelling or pain at the port site or the surrounding area.   You have a fever that is not controlled with medicine.   This information is not intended to replace advice given to you by your health care provider. Make sure you discuss any questions you have with your health care provider.   Document Released: 12/02/2005 Document Revised: 09/22/2013 Document Reviewed: 08/09/2013 Elsevier Interactive Patient Education 2016 Elsevier Inc. External Beam Radiation Therapy External beam radiation therapy is a radiation treatment. It may be done to:  Treat cancer. The radiation may be used to:  Destroy cancer cells. Radiation delivered during the treatment damages cancer cells. It also damages normal cells, but normal cells have the DNA to repair themselves while cancer cells do not.  Help with symptoms of your cancer.  Stop the growth of any remaining cancer cells after surgery.  Prevent cancer cells from growing in areas that do not have evidence of cancer (prophylactic radiation therapy).  Treat or shrink a tumor.  Reduce pain (palliative therapy). The therapy delivers higher doses of radiation than X-rays, CT scans, and most other imaging tests. Compared with internal radiation therapy, external beam radiation therapy can deliver radiation to a fairly large area.  The amount of radiation you will receive and the length of therapy depends on your medical condition. You should not feel the radiation being delivered or any pain during your therapy. RISKS AND COMPLICATIONS Most people experience side effects from the therapy. Side effects depend on the amount of radiation and the part of your body exposed to radiation. For example:  Hair  loss may occur if the radiation therapy is directed to your head.  Coughing or difficulty swallowing may occur if the radiation therapy is directed to your head, neck, or  chest.  Nausea, vomiting, or diarrhea may occur if the radiation therapy is directed to your abdomen or pelvis.  Bladder problems, frequent urination, or sexual dysfunction may occur if the radiation therapy is directed to your bladder, kidney, or prostate. Regardless of the amount or location of the radiation, you will probably have fatigue. Other side effects may include:  Red, flaking skin in the affected area.  Hair loss in the affected area.  Itching in the affected area. Side effects may take 2-3 weeks to develop. Most side effects are temporary and can be controlled. Once the therapy is complete, side effects will not stop right away. It can take up to 3-4 weeks for you to regain your energy or for side effects to lessen. Your body does heal from the radiation. BEFORE THE PROCEDURE There will be a planning session (simulation). During the session:  Your health care provider will plan exactly where the radiation will be delivered (treatment field).  You will be positioned for your therapy. The goal is to have a position that can be reproduced for each therapy session.  Temporary marks may be drawn on your body. Permanent marks may also be drawn on your body in order for you to be positioned the same way for each therapy session. PROCEDURE  You will either lie on a table or sit in a chair in the position determined for your therapy.  The radiation machine (linear accelerator) will move around you to deliver the radiation in exact doses from many angles. AFTER THE PROCEDURE You may return to your normal schedule including diet, activities, and medicines, unless your health care provider tells you otherwise.   This information is not intended to replace advice given to you by your health care provider. Make sure you discuss any questions you have with your health care provider.   Document Released: 04/20/2009 Document Revised: 08/23/2015 Document Reviewed: 11/10/2013 Elsevier  Interactive Patient Education 2016 [employer]. Chemotherapy Chemotherapy is the use of medicines to stop or slow the growth of cancer cells. Depending on the type and stage of your cancer, you may have chemotherapy to:  Cure your cancer.  Slow the progression of your cancer.  Ease your cancer symptoms.  Improve the benefits of radiation treatment.  Shrink a tumor before surgery.  Rid the body of cancer cells that remain after a tumor is surgically removed. HOW IS CHEMOTHERAPY GIVEN? Chemotherapy may be given:  By mouth in liquid or pill form.  Through a thin tube that is inserted into a vein or artery.  By getting a shot.  By rubbing a cream or ointment on your skin.  Through liquids that are placed directly into various areas of the body, such as the abdomen, chest, or bladder. HOW OFTEN IS CHEMOTHERAPY GIVEN? Chemotherapy may be given continuously over time, or it may be given in cycles. For example, you may take the medicine for one week out of every month. FOR HOW LONG WILL I NEED CHEMOTHERAPY TREATMENTS? The length of treatment depends on many factors, including:  The type of cancer.  Whether the cancer has spread.  How you respond to the chemotherapy.  Whether you develop side effects. Some types of chemotherapy medicine are given only one time. Others are given for months, years, or for life. WHAT SAFETY PRECAUTIONS MUST  I TAKE WHILE ON CHEMOTHERAPY? Chemotherapy medicines are very strong. They will be in all of your bodily fluids, including your urine, stool, saliva, sweat, tears, vaginal secretions, and semen. You must carefully follow some safety precautions to prevent harm to others while you are using these medicines. Here are some recommended precautions:  Make sure that people who help care for you wear disposable gloves if they are going to come into contact with any of your bodily fluids. Women who are pregnant or breastfeeding should not handle any of  your bodily fluids.  Wash any clothes, towels, and linens that may have your bodily fluids on them twice in a washing machine using very hot water.  Dispose of adult diapers, tampons, and sanitary napkins by first sealing them in a plastic bag.  Use a condom when having sex for at least 2 weeks after receiving your chemotherapy.  Do not share beverages or food.  Keep your chemotherapy medicines in their original bottles. Keep them in a high, safe location, away from children. Do not expose them to heat or moisture. Do not put them in containers with other types of medicines.  Dispose of all wrappers for your chemotherapy medicines by sealing them in a separate plastic bag.  Do not throw away extra medicine, and do not flush it down the toilet. Take medicine that you are not going to use to your health care provider's office where it can be disposed of properly.  Follow your health care provider's directions for the proper disposal of needles, IV tubing, and other medical supplies that have come into contact with your chemotherapy medicines.  If you are issued a hazardous waste container, make sure you understand the directions for using it.  Wash your hands thoroughly with warm water and soap after using the bathroom. Dry your hands with disposable paper towels.  When using the toilet:  Flush it twice after each use, including after vomiting.  Close the lid of the toilet prior to flushing. This helps to avoid splashing.  Both men and women should sit to use the toilet. This helps avoid splashing. WHAT ARE THE SIDE EFFECTS OF CHEMOTHERAPY? Side effects depend on a variety of factors, including:  The specific type of chemotherapy medicine used.  The dosage.  How long the medicine is used for.  Your overall health. Some of the side effects you may experience include:  Fatigue and decreased energy.  Decreased appetite.  Changes in your sense of smell or  taste.  Nausea.  Vomiting.  Constipation or diarrhea.  Hair loss.  Increased susceptibility to infection.  Easy bleeding.  Mouth sores.  Burning or tingling in the hands or feet.  Memory problems.   This information is not intended to replace advice given to you by your health care provider. Make sure you discuss any questions you have with your health care provider.   Document Released: 09/29/2007 Document Revised: 12/23/2014 Document Reviewed: 05/10/2014 Elsevier Interactive Patient Education 2016 [employer]. Paclitaxel injection What is this medicine? PACLITAXEL (PAK li TAX el) is a chemotherapy drug. It targets fast dividing cells, like cancer cells, and causes these cells to die. This medicine is used to treat ovarian cancer, breast cancer, and other cancers. This medicine may be used for other purposes; ask your health care provider or pharmacist if you have questions. What should I tell my health care provider before I take this medicine? They need to know if you have any of these conditions: -blood disorders -irregular  heartbeat -infection (especially a virus infection such as chickenpox, cold sores, or herpes) -liver disease -previous or ongoing radiation therapy -an unusual or allergic reaction to paclitaxel, alcohol, polyoxyethylated castor oil, other chemotherapy agents, other medicines, foods, dyes, or preservatives -pregnant or trying to get pregnant -breast-feeding How should I use this medicine? This drug is given as an infusion into a vein. It is administered in a hospital or clinic by a specially trained health care professional. Talk to your pediatrician regarding the use of this medicine in children. Special care may be needed. Overdosage: If you think you have taken too much of this medicine contact a poison control center or emergency room at once. NOTE: This medicine is only for you. Do not share this medicine with others. What if I miss a  dose? It is important not to miss your dose. Call your doctor or health care professional if you are unable to keep an appointment. What may interact with this medicine? Do not take this medicine with any of the following medications: -disulfiram -metronidazole This medicine may also interact with the following medications: -cyclosporine -diazepam -ketoconazole -medicines to increase blood counts like filgrastim, pegfilgrastim, sargramostim -other chemotherapy drugs like cisplatin, doxorubicin, epirubicin, etoposide, teniposide, vincristine -quinidine -testosterone -vaccines -verapamil Talk to your doctor or health care professional before taking any of these medicines: -acetaminophen -aspirin -ibuprofen -ketoprofen -naproxen This list may not describe all possible interactions. Give your health care provider a list of all the medicines, herbs, non-prescription drugs, or dietary supplements you use. Also tell them if you smoke, drink alcohol, or use illegal drugs. Some items may interact with your medicine. What should I watch for while using this medicine? Your condition will be monitored carefully while you are receiving this medicine. You will need important blood work done while you are taking this medicine. This drug may make you feel generally unwell. This is not uncommon, as chemotherapy can affect healthy cells as well as cancer cells. Report any side effects. Continue your course of treatment even though you feel ill unless your doctor tells you to stop. This medicine can cause serious allergic reactions. To reduce your risk you will need to take other medicine(s) before treatment with this medicine. In some cases, you may be given additional medicines to help with side effects. Follow all directions for their use. Call your doctor or health care professional for advice if you get a fever, chills or sore throat, or other symptoms of a cold or flu. Do not treat yourself. This drug  decreases your body's ability to fight infections. Try to avoid being around people who are sick. This medicine may increase your risk to bruise or bleed. Call your doctor or health care professional if you notice any unusual bleeding. Be careful brushing and flossing your teeth or using a toothpick because you may get an infection or bleed more easily. If you have any dental work done, tell your dentist you are receiving this medicine. Avoid taking products that contain aspirin, acetaminophen, ibuprofen, naproxen, or ketoprofen unless instructed by your doctor. These medicines may hide a fever. Do not become pregnant while taking this medicine. Women should inform their doctor if they wish to become pregnant or think they might be pregnant. There is a potential for serious side effects to an unborn child. Talk to your health care professional or pharmacist for more information. Do not breast-feed an infant while taking this medicine. Men are advised not to father a child while receiving this medicine.  This product may contain alcohol. Ask your pharmacist or healthcare provider if this medicine contains alcohol. Be sure to tell all healthcare providers you are taking this medicine. Certain medicines, like metronidazole and disulfiram, can cause an unpleasant reaction when taken with alcohol. The reaction includes flushing, headache, nausea, vomiting, sweating, and increased thirst. The reaction can last from 30 minutes to several hours. What side effects may I notice from receiving this medicine? Side effects that you should report to your doctor or health care professional as soon as possible: -allergic reactions like skin rash, itching or hives, swelling of the face, lips, or tongue -low blood counts - This drug may decrease the number of white blood cells, red blood cells and platelets. You may be at increased risk for infections and bleeding. -signs of infection - fever or chills, cough, sore throat,  pain or difficulty passing urine -signs of decreased platelets or bleeding - bruising, pinpoint red spots on the skin, black, tarry stools, nosebleeds -signs of decreased red blood cells - unusually weak or tired, fainting spells, lightheadedness -breathing problems -chest pain -high or low blood pressure -mouth sores -nausea and vomiting -pain, swelling, redness or irritation at the injection site -pain, tingling, numbness in the hands or feet -slow or irregular heartbeat -swelling of the ankle, feet, hands Side effects that usually do not require medical attention (report to your doctor or health care professional if they continue or are bothersome): -bone pain -complete hair loss including hair on your head, underarms, pubic hair, eyebrows, and eyelashes -changes in the color of fingernails -diarrhea -loosening of the fingernails -loss of appetite -muscle or joint pain -red flush to skin -sweating This list may not describe all possible side effects. Call your doctor for medical advice about side effects. You may report side effects to FDA at 1-800-FDA-1088. Where should I keep my medicine? This drug is given in a hospital or clinic and will not be stored at home. NOTE: This sheet is a summary. It may not cover all possible information. If you have questions about this medicine, talk to your doctor, pharmacist, or health care provider.    2016, [employer]. (2015-07-20 13:02:56) Carboplatin injection What is this medicine? CARBOPLATIN (KAR boe pla tin) is a chemotherapy drug. It targets fast dividing cells, like cancer cells, and causes these cells to die. This medicine is used to treat ovarian cancer and many other cancers. This medicine may be used for other purposes; ask your health care provider or pharmacist if you have questions. What should I tell my health care provider before I take this medicine? They need to know if you have any of these conditions: -blood  disorders -hearing problems -kidney disease -recent or ongoing radiation therapy -an unusual or allergic reaction to carboplatin, cisplatin, other chemotherapy, other medicines, foods, dyes, or preservatives -pregnant or trying to get pregnant -breast-feeding How should I use this medicine? This drug is usually given as an infusion into a vein. It is administered in a hospital or clinic by a specially trained health care professional. Talk to your pediatrician regarding the use of this medicine in children. Special care may be needed. Overdosage: If you think you have taken too much of this medicine contact a poison control center or emergency room at once. NOTE: This medicine is only for you. Do not share this medicine with others. What if I miss a dose? It is important not to miss a dose. Call your doctor or health care professional if you are  unable to keep an appointment. What may interact with this medicine? -medicines for seizures -medicines to increase blood counts like filgrastim, pegfilgrastim, sargramostim -some antibiotics like amikacin, gentamicin, neomycin, streptomycin, tobramycin -vaccines Talk to your doctor or health care professional before taking any of these medicines: -acetaminophen -aspirin -ibuprofen -ketoprofen -naproxen This list may not describe all possible interactions. Give your health care provider a list of all the medicines, herbs, non-prescription drugs, or dietary supplements you use. Also tell them if you smoke, drink alcohol, or use illegal drugs. Some items may interact with your medicine. What should I watch for while using this medicine? Your condition will be monitored carefully while you are receiving this medicine. You will need important blood work done while you are taking this medicine. This drug may make you feel generally unwell. This is not uncommon, as chemotherapy can affect healthy cells as well as cancer cells. Report any side effects.  Continue your course of treatment even though you feel ill unless your doctor tells you to stop. In some cases, you may be given additional medicines to help with side effects. Follow all directions for their use. Call your doctor or health care professional for advice if you get a fever, chills or sore throat, or other symptoms of a cold or flu. Do not treat yourself. This drug decreases your body's ability to fight infections. Try to avoid being around people who are sick. This medicine may increase your risk to bruise or bleed. Call your doctor or health care professional if you notice any unusual bleeding. Be careful brushing and flossing your teeth or using a toothpick because you may get an infection or bleed more easily. If you have any dental work done, tell your dentist you are receiving this medicine. Avoid taking products that contain aspirin, acetaminophen, ibuprofen, naproxen, or ketoprofen unless instructed by your doctor. These medicines may hide a fever. Do not become pregnant while taking this medicine. Women should inform their doctor if they wish to become pregnant or think they might be pregnant. There is a potential for serious side effects to an unborn child. Talk to your health care professional or pharmacist for more information. Do not breast-feed an infant while taking this medicine. What side effects may I notice from receiving this medicine? Side effects that you should report to your doctor or health care professional as soon as possible: -allergic reactions like skin rash, itching or hives, swelling of the face, lips, or tongue -signs of infection - fever or chills, cough, sore throat, pain or difficulty passing urine -signs of decreased platelets or bleeding - bruising, pinpoint red spots on the skin, black, tarry stools, nosebleeds -signs of decreased red blood cells - unusually weak or tired, fainting spells, lightheadedness -breathing problems -changes in  hearing -changes in vision -chest pain -high blood pressure -low blood counts - This drug may decrease the number of white blood cells, red blood cells and platelets. You may be at increased risk for infections and bleeding. -nausea and vomiting -pain, swelling, redness or irritation at the injection site -pain, tingling, numbness in the hands or feet -problems with balance, talking, walking -trouble passing urine or change in the amount of urine Side effects that usually do not require medical attention (report to your doctor or health care professional if they continue or are bothersome): -hair loss -loss of appetite -metallic taste in the mouth or changes in taste This list may not describe all possible side effects. Call your doctor for medical  advice about side effects. You may report side effects to FDA at 1-800-FDA-1088. Where should I keep my medicine? This drug is given in a hospital or clinic and will not be stored at home. NOTE: This sheet is a summary. It may not cover all possible information. If you have questions about this medicine, talk to your doctor, pharmacist, or health care provider.    2016, [employer] Standard. (2008-03-08 14:38:05)

## 2016-05-16 ENCOUNTER — Other Ambulatory Visit: Payer: Self-pay | Admitting: Radiology

## 2016-05-16 ENCOUNTER — Ambulatory Visit
Admission: RE | Admit: 2016-05-16 | Discharge: 2016-05-16 | Disposition: A | Discharge status: Home / Self Care | Payer: Medicare Other | Source: Ambulatory Visit | Attending: Radiation Oncology | Admitting: Radiation Oncology

## 2016-05-16 DIAGNOSIS — Z510 Encounter for antineoplastic radiation therapy: Secondary | ICD-10-CM | POA: Diagnosis not present

## 2016-05-16 DIAGNOSIS — C3411 Malignant neoplasm of upper lobe, right bronchus or lung: Secondary | ICD-10-CM

## 2016-05-16 DIAGNOSIS — R918 Other nonspecific abnormal finding of lung field: Secondary | ICD-10-CM

## 2016-05-16 NOTE — Progress Notes (Signed)
  Radiation Oncology         [phone] ________________________________  Name: George Pollard MRN: [id number]  Date: 05/16/2016  DOB: [birth year]  SIMULATION AND TREATMENT PLANNING NOTE    ICD-9-CM ICD-10-CM   1. 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  Identity was confirmed.  All relevant records and images related to the planned course of therapy were reviewed.  The patient freely provided informed written consent to proceed with treatment after reviewing the details related to the planned course of therapy. The consent form was witnessed and verified by the simulation staff.  Then, the patient was set-up in a stable reproducible  supine position for radiation therapy.  CT images were obtained.  Surface markings were placed.  The CT images were loaded into the planning software.  Then the target and avoidance structures were contoured.  Treatment planning then occurred.  The radiation prescription was entered and confirmed.  Then, I designed and supervised the construction of a total of one medically necessary complex treatment device, including a BodyFix immobilization mold custom fitted to the patient.    Intensity Modulated Radiotherapy (IMRT) is medically necessary for this case for the following reason:  Large stage IIIB target adjacent to spinal cord.  I have requested a DVH of the following structures: Left lung, right lung, spinal cord, heart, esophagus, and target.  I have ordered:Nutrition Consult  SPECIAL TREATMENT PROCEDURE:  The planned course of therapy using radiation constitutes a special treatment procedure. Special care is required in the management of this patient for the following reasons.  The patient will be receiving  concurrent chemotherapy requiring careful monitoring for increased toxicities of treatment including periodic laboratory values.  The special nature of the planned course of radiotherapy will require increased physician supervision and oversight to ensure patient's safety with optimal treatment outcomes.  PLAN:  The patient will receive 66 Gy in 33 fractions, depending on biopsy.  ________________________________  Sheral Apley Tammi Klippel, M.D.  This document serves as a record of services personally performed by Tyler Pita, MD. It was created on his behalf by Arlyce Harman, a trained medical scribe. The creation of this record is based on the scribe's personal observations and the provider's statements to them. This document has been checked and approved by the attending provider.

## 2016-05-17 ENCOUNTER — Ambulatory Visit (HOSPITAL_COMMUNITY): Admission: RE | Admit: 2016-05-17 | Payer: Medicare Other | Source: Ambulatory Visit

## 2016-05-17 ENCOUNTER — Encounter (HOSPITAL_COMMUNITY): Payer: Self-pay | Admitting: Hematology & Oncology

## 2016-05-17 ENCOUNTER — Encounter (HOSPITAL_COMMUNITY): Payer: Self-pay

## 2016-05-17 ENCOUNTER — Ambulatory Visit (HOSPITAL_COMMUNITY)
Admission: RE | Admit: 2016-05-17 | Discharge: 2016-05-17 | Disposition: A | Discharge status: Home / Self Care | Payer: Medicare Other | Source: Ambulatory Visit | Attending: Radiation Oncology | Admitting: Radiation Oncology

## 2016-05-17 DIAGNOSIS — Z955 Presence of coronary angioplasty implant and graft: Secondary | ICD-10-CM | POA: Insufficient documentation

## 2016-05-17 DIAGNOSIS — M25511 Pain in right shoulder: Secondary | ICD-10-CM | POA: Diagnosis not present

## 2016-05-17 DIAGNOSIS — C3411 Malignant neoplasm of upper lobe, right bronchus or lung: Secondary | ICD-10-CM | POA: Insufficient documentation

## 2016-05-17 DIAGNOSIS — Z9889 Other specified postprocedural states: Secondary | ICD-10-CM

## 2016-05-17 DIAGNOSIS — I252 Old myocardial infarction: Secondary | ICD-10-CM | POA: Insufficient documentation

## 2016-05-17 DIAGNOSIS — E119 Type 2 diabetes mellitus without complications: Secondary | ICD-10-CM | POA: Insufficient documentation

## 2016-05-17 DIAGNOSIS — Z7982 Long term (current) use of aspirin: Secondary | ICD-10-CM | POA: Diagnosis not present

## 2016-05-17 DIAGNOSIS — Z7984 Long term (current) use of oral hypoglycemic drugs: Secondary | ICD-10-CM | POA: Diagnosis not present

## 2016-05-17 DIAGNOSIS — R918 Other nonspecific abnormal finding of lung field: Secondary | ICD-10-CM

## 2016-05-17 DIAGNOSIS — Z79899 Other long term (current) drug therapy: Secondary | ICD-10-CM | POA: Insufficient documentation

## 2016-05-17 DIAGNOSIS — I2510 Atherosclerotic heart disease of native coronary artery without angina pectoris: Secondary | ICD-10-CM | POA: Diagnosis not present

## 2016-05-17 DIAGNOSIS — Z951 Presence of aortocoronary bypass graft: Secondary | ICD-10-CM | POA: Insufficient documentation

## 2016-05-17 DIAGNOSIS — Z7901 Long term (current) use of anticoagulants: Secondary | ICD-10-CM | POA: Diagnosis not present

## 2016-05-17 DIAGNOSIS — Z87891 Personal history of nicotine dependence: Secondary | ICD-10-CM | POA: Diagnosis not present

## 2016-05-17 DIAGNOSIS — Z95810 Presence of automatic (implantable) cardiac defibrillator: Secondary | ICD-10-CM | POA: Diagnosis not present

## 2016-05-17 DIAGNOSIS — I4891 Unspecified atrial fibrillation: Secondary | ICD-10-CM | POA: Diagnosis not present

## 2016-05-17 DIAGNOSIS — I10 Essential (primary) hypertension: Secondary | ICD-10-CM | POA: Diagnosis not present

## 2016-05-17 HISTORY — DX: Malignant neoplasm of upper lobe, right bronchus or lung: C34.11

## 2016-05-17 LAB — PROTIME-INR
INR: 1.36 (ref 0.00–1.49)
PROTHROMBIN TIME: 16.9 s — AB (ref 11.6–15.2)

## 2016-05-17 LAB — GLUCOSE, CAPILLARY
GLUCOSE-CAPILLARY: 183 mg/dL — AB (ref 65–99)
Glucose-Capillary: 167 mg/dL — ABNORMAL HIGH (ref 65–99)

## 2016-05-17 LAB — CBC
HCT: 38.7 % — ABNORMAL LOW (ref 39.0–52.0)
Hemoglobin: 11.8 g/dL — ABNORMAL LOW (ref 13.0–17.0)
MCH: 28.2 pg (ref 26.0–34.0)
MCHC: 30.5 g/dL (ref 30.0–36.0)
MCV: 92.4 fL (ref 78.0–100.0)
PLATELETS: 203 10*3/uL (ref 150–400)
RBC: 4.19 MIL/uL — ABNORMAL LOW (ref 4.22–5.81)
RDW: 17.1 % — AB (ref 11.5–15.5)
WBC: 12.2 10*3/uL — ABNORMAL HIGH (ref 4.0–10.5)

## 2016-05-17 LAB — APTT: aPTT: 28 seconds (ref 24–37)

## 2016-05-17 MED ORDER — MIDAZOLAM HCL 2 MG/2ML IJ SOLN
INTRAMUSCULAR | Status: AC | PRN
  Administered 2016-05-17: 1 mg via INTRAVENOUS

## 2016-05-17 MED ORDER — FENTANYL CITRATE (PF) 100 MCG/2ML IJ SOLN
INTRAMUSCULAR | Status: AC | PRN
  Administered 2016-05-17: 25 ug via INTRAVENOUS
  Administered 2016-05-17: 50 ug via INTRAVENOUS

## 2016-05-17 MED ORDER — SODIUM CHLORIDE 0.9 % IV SOLN: INTRAVENOUS | Status: DC

## 2016-05-17 MED ORDER — LIDOCAINE HCL 1 % IJ SOLN
INTRAMUSCULAR | Status: AC
  Filled 2016-05-17: qty 20

## 2016-05-17 MED ORDER — MIDAZOLAM HCL 2 MG/2ML IJ SOLN
INTRAMUSCULAR | Status: AC
  Filled 2016-05-17: qty 4

## 2016-05-17 MED ORDER — FENTANYL CITRATE (PF) 100 MCG/2ML IJ SOLN
INTRAMUSCULAR | Status: AC
  Filled 2016-05-17: qty 4

## 2016-05-17 MED ORDER — SODIUM CHLORIDE 0.9 % IV SOLN
INTRAVENOUS | Status: AC | PRN
  Administered 2016-05-17: 10 mL/h via INTRAVENOUS

## 2016-05-17 NOTE — Progress Notes (Signed)
Pt arrived to [facility] P2 with bandage to right upper back saturated, bandage removed and clean 2x2 guaze with tegaderm placed. Will continue to monitor

## 2016-05-17 NOTE — Discharge Instructions (Signed)

## 2016-05-17 NOTE — Sedation Documentation (Signed)
Per Dr. Earleen Newport pt can take his own home pain medication while in room. This RN visibly seen pt take 1 Norco 10/325 mg tablet at bedside.  Babs Bertin RN

## 2016-05-17 NOTE — Sedation Documentation (Signed)
Patient is resting comfortably. Additional medication given as discussed with DR. Earleen Newport

## 2016-05-17 NOTE — H&P (Signed)
Chief Complaint: Patient was seen in consultation today for right lung mass biopsy at the request of Manning,Matthew  Referring Physician(s): Manning,Matthew  Supervising Physician: Corrie Mckusick  Patient Status: Out-pt  History of Present Illness: George Pollard is a 82 y.o. 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 Pancoast tumor extending from the medial right upper lobe into the right posterior superior mediastinum. Mass measures 6.1 x 4.7 x 4.9 cm. Mass abuts and may invade the esophagus and posterior trachea. Mass abuts the central right subclavian artery. The mass causes erosion of the right anterior aspects of the C7, T1 and T2 vertebra. 2. There is an 11 mm nodule in the left upper lobe consistent with contralateral metastatic disease. A more ground-glass type area of opacity in the left upper lobe is noted that is likely scarring. No other convincing lung metastatic disease or metastatic disease in the chest. 3. Bilateral adrenal masses are similar to the prior CT which suggests bilateral adenomas. Metastatic disease is not excluded but felt less likely. No other evidence of metastatic disease in the abdomen or pelvis.  Lymph node bx  And bronchoscopy 05/03/16 negative  PET 5/26 + IMPRESSION: 1. Intensely hypermetabolic RIGHT upper lobe mass invading the mediastinum consistent with malignant Pancoast tumor. Metabolic activity is uniform throughout the lesion. 2. No evidence of metastatic adenopathy in the mediastinum. 3. Two LEFT upper lobe pulmonary nodules are concerning for metastatic disease. 4. New gas and semi-solid tissue collection in the RIGHT lower paratracheal location is presumed sequelae of the bronchoscopy and lymph node sampling. This collection is rather large at 3.5 cm but no significant metabolic activity which  suggests a sterile collection. No pneumothorax. 5. Bilateral adrenal adenomas.  Request now for biopsy of pancoast tumor for tissue diagnosis Hi wbc secondary Decadron Last dose Coumadin 5/26  Past Medical History  Diagnosis Date  . CAD (coronary artery disease)     a. CABG 1997. b. NSTEMI (troponin >20) s/p PTCA/DES to LCx 10/21/12, overlapping DES to mid & distal RCA 10/26/12; c. 02/2016 MV: EF 37%, large fixed defect - apex, inf, septal, lat wall, no ischemia.  . Atrial fibrillation ([place])   . Hypertension   . BPH (benign prostatic hyperplasia)   . PVD (peripheral vascular disease) ([place])     a. AAA repair 2001.  Marland Kitchen Systolic CHF ([place])     a. 10/2012: acute on chronic systolic CHF / pulmonary edema secondary to severe mixed cardiomyopathy;  b. 02/2016 Echo: EF 30-35%, sev dil RV, RV dysfxn, massively dil RA, PASP 52mHg.  . Diabetes mellitus ([place]     a. Noted 10/2012 (prior hx of DM resolved with weight loss).  . [place]cardia [place]     a. 02/2016 4 ICD shocks for VT-->Amio added.  .Marland KitchenAICD (automatic cardioverter/defibrillator) present   . Shortness of breath dyspnea   . Anxiety   . Lung cancer ([place]     pancoast tumor right lung apex invading mediastinum and C7, T1, T2 with extension into the neural foramina and into the spinal canal    Past Surgical History  Procedure Laterality Date  . Coronary artery bypass graft      1997  . Abdominal aortic aneurysm repair      08/26/2000  . Cataract extraction    . Tonsillectomy    . Left heart catheterization with coronary/graft angiogram  N/A 10/21/2012    Procedure: LEFT HEART CATHETERIZATION WITH Beatrix Fetters;  Surgeon: Burnell Blanks, MD;  Location: [facility] CATH LAB;  Service: Cardiovascular;  Laterality: N/A;  . Percutaneous coronary stent intervention (pci-s) N/A 10/26/2012    Procedure: PERCUTANEOUS CORONARY STENT INTERVENTION (PCI-S);  Surgeon: Sherren Mocha, MD;  Location: [facility] CATH LAB;  Service:  Cardiovascular;  Laterality: N/A;  . Cardiac catheterization    . Mediastinoscopy N/A 05/03/2016    Procedure: MEDIASTINOSCOPY;  Surgeon: Ivin Poot, MD;  Location: [facility] OR;  Service: Thoracic;  Laterality: N/A;  . Video bronchoscopy with endobronchial ultrasound N/A 05/03/2016    Procedure: VIDEO BRONCHOSCOPY WITH ENDOBRONCHIAL ULTRASOUND;  Surgeon: Ivin Poot, MD;  Location: [facility] OR;  Service: Thoracic;  Laterality: N/A;    Allergies: Review of patient's allergies indicates no known allergies.  Medications: Prior to Admission medications   Medication Sig Start Date End Date Taking? Authorizing Provider  amiodarone (PACERONE) 200 MG tablet Take 1 tablet by mouth 2 (two) times daily. 03/26/16  Yes Historical Provider, MD  aspirin EC 81 MG tablet Take 81 mg by mouth daily.   Yes Historical Provider, MD  atorvastatin (LIPITOR) 80 MG tablet take 1 tablet by mouth once daily 12/18/12  Yes Jolaine Artist, MD  dexamethasone (DECADRON) 2 MG tablet Take 1 tablet (2 mg total) by mouth 2 (two) times daily with a meal. 04/26/16  Yes Patrici Ranks, MD  fentaNYL (DURAGESIC - DOSED MCG/HR) 12 MCG/HR Place 1 patch (12.5 mcg total) onto the skin every 3 (three) days. 05/15/16  Yes Patrici Ranks, MD  furosemide (LASIX) 40 MG tablet Take 1 tablet (40 mg total) by mouth 2 (two) times daily. 04/05/16  Yes Evans Lance, MD  HYDROcodone-acetaminophen (NORCO) 10-325 MG tablet Take 1 tablet by mouth every 4 (four) hours as needed. 05/15/16  Yes Patrici Ranks, MD  lisinopril (PRINIVIL,ZESTRIL) 2.5 MG tablet take 1 tablet by mouth once daily 12/18/12  Yes Jolaine Artist, MD  metFORMIN (GLUCOPHAGE) 500 MG tablet Take 500 mg by mouth daily.  01/22/16  Yes Historical Provider, MD  Metoprolol Tartrate (LOPRESSOR) 50 MG tablet Take 1.5 tablets (75 mg total) by mouth 2 (two) times daily. 03/01/16  Yes Rogelia Mire, NP  oxyCODONE (OXY IR/ROXICODONE) 5 MG immediate release tablet Take 1-2 tablets (5-10 mg  total) by mouth every 6 (six) hours as needed for severe pain. 05/04/16  Yes Wayne E Gold, PA-C  potassium chloride SA (K-DUR,KLOR-CON) 20 MEQ tablet Take 1 tablet by mouth daily. 01/10/16  Yes Historical Provider, MD  nitroGLYCERIN (NITROSTAT) 0.4 MG SL tablet Place 1 tablet (0.4 mg total) under the tongue every 5 (five) minutes as needed for chest pain (up to 3 doses). 10/29/12   Dayna N Dunn, PA-C  ondansetron (ZOFRAN) 8 MG tablet Take 1 tablet (8 mg total) by mouth every 8 (eight) hours as needed for nausea or vomiting. 05/15/16   Patrici Ranks, MD  prochlorperazine (COMPAZINE) 10 MG tablet Take 1 tablet (10 mg total) by mouth every 6 (six) hours as needed for nausea or vomiting. 05/15/16   Patrici Ranks, MD  warfarin (COUMADIN) 5 MG tablet Take 1 tablet (5 mg total) by mouth daily at 6 PM. 03/01/16   Rogelia Mire, NP     Family History  Problem Relation Age of Onset  . Valvular heart disease Daughter     MVP repair    Social History   Social History  .  Marital Status: Married    Spouse Name: N/A  . Number of Children: 4  . Years of Education: N/A   Social History Main Topics  . Smoking status: Former Smoker -- 2.00 packs/day for 20 years    Types: Cigarettes    Quit date: 12/17/1995  . Smokeless tobacco: Never Used     Comment: Quit 1997  . Alcohol Use: No  . Drug Use: No  . Sexual Activity: No   Other Topics Concern  . None   Social History Narrative   Lives in [place].  Lives with wife.       Review of Systems: A 12 point ROS discussed and pertinent positives are indicated in the HPI above.  All other systems are negative.  Review of Systems  Constitutional: Positive for activity change. Negative for fever.  Respiratory: Negative for cough and shortness of breath.   Cardiovascular: Positive for chest pain.  Gastrointestinal: Negative for abdominal pain.  Musculoskeletal: Positive for back pain and neck pain.  Neurological: Positive for weakness.    Psychiatric/Behavioral: Negative for behavioral problems and confusion.    Vital Signs: BP 119/67 mmHg  Pulse 73  Temp(Src) 97.6 F (36.4 C) (Oral)  Resp 16  Ht '5\' 10"'$  (1.778 m)  Wt 212 lb (96.163 kg)  BMI 30.42 kg/m2  SpO2 95%  Physical Exam  Constitutional: He is oriented to person, place, and time.  HENT:  Speaks with hoarse voice  Cardiovascular: Normal rate and regular rhythm.   No murmur heard. Pulmonary/Chest: Effort normal and breath sounds normal. He has no wheezes.  Abdominal: Soft. Bowel sounds are normal. There is no tenderness.  Musculoskeletal: Normal range of motion.  Rt shoulder pain  Neurological: He is alert and oriented to person, place, and time.  Skin: Skin is warm and dry.  Psychiatric: He has a normal mood and affect. His behavior is normal. Judgment and thought content normal.  Nursing note and vitals reviewed.   Mallampati Score:  MD Evaluation Airway: WNL Heart: WNL Abdomen: WNL Chest/ Lungs: WNL ASA  Classification: 3 Mallampati/Airway Score: One  Imaging: Dg Chest 2 View  05/02/2016  CLINICAL DATA:  History of coronary artery disease and mediastinoscopy, CABG EXAM: CHEST  2 VIEW COMPARISON:  CT chest of 04/29/2016 and chest x-ray of 02/28/2016 FINDINGS: The medial right apical lung lesion is again noted consistent with superior sulcus tumor. In view of the CT the left upper and lingular lesions noted by CT are not visible by chest x-ray. No parenchymal infiltrate or effusion is seen. The heart is mildly enlarged. AICD lead is noted and median sternotomy sutures are present. There are degenerative changes in the lower thoracic spine with compression of T12 vertebral body, unchanged compared to the sagittal CT images. IMPRESSION: 1. No change in right apical lung lesion consistent with superior sulcus tumor. 2. No infiltrate or effusion. 3. Stable mild cardiomegaly with AICD lead. 4. Stable compression of T12 vertebral body. Electronically Signed    By: Ivar Drape M.D.   On: 05/02/2016 15:42   Ct Chest W Contrast  04/29/2016  CLINICAL DATA:  Pancoast tumor of right lung [facility]) history of aneurysm repair. EXAM: CT CHEST, ABDOMEN, AND PELVIS WITH CONTRAST TECHNIQUE: Multidetector CT imaging of the chest, abdomen and pelvis was performed following the standard protocol during bolus administration of intravenous contrast. CONTRAST:  167m ISOVUE-300 IOPAMIDOL (ISOVUE-300) INJECTION 61% COMPARISON:  CTA, 11/21/2015 FINDINGS: CT CHEST Neck base and axilla: There is abnormal soft tissue with associated bone  resorption on the right at the cervical thoracic junction involving C7, T1 and T2. The soft tissue component at the cervical thoracic junction on the right contiguous with the involved vertebra, measures 3.4 x 1.6 cm. No other neck base abnormalities. Mediastinum and hila: Right posterior mediastinal mass, which may reflect a right upper lobe mass invading the mediastinum, measures 6.1 cm superior inferior by 4.7 x 4.9 cm transversely. The mass is contiguous with the right margin of the esophagus, which is displaced to the left. It is also contiguous with the posterior aspect of the trachea that has a mild anterior convex bulge. The mass is contiguous with the posterior margin of the central right subclavian artery. Lungs and pleura: There is irregular ground-glass opacity in a peribronchovascular distribution in the left upper lobe, centered on image 29, series 6, measuring 16 mm greatest transverse dimension. There is a solid lobulated nodule in the left upper lobe, image 73, measuring 11 mm. Reticular type opacity is seen adjacent to the right medial upper lobe/mediastinal mass. There is linear/discoid type opacity in the lower lungs most consistent with atelectasis/ scarring. Mild to moderate changes of centrilobular emphysema are noted most evident in the upper lobes. No pleural effusion. No pneumothorax. There are changes from previous CABG surgery. CT  ABDOMEN AND PELVIS Hepatobiliary: No liver mass or lesion. Small dependent gallstones. Gallbladder otherwise unremarkable. No bile duct dilation. Spleen and pancreas:  Unremarkable. Adrenal glands: Bilateral adrenal masses, larger on the right measuring 2.5 cm, similar to the prior CT. Kidneys, ureters, bladder: 11 cm midpole right renal cyst without change from the prior study. No other renal masses. No hydronephrosis. Ureters are normal in course and in caliber. Bladder is unremarkable. Prostate gland:  Enlarged but stable. Lymph nodes:  No adenopathy. Vascular: Diffuse aortic ectasia with atherosclerotic calcifications. No change from the prior study. Ascites:  None. Gastrointestinal:  Unremarkable.  Normal appendix visualized. MUSCULOSKELETAL As described on the chest, there is resorption of portions of the right C7, T1 and T2 vertebrae via the above described mass. No other osteolytic lesions. There is a stable moderate compression fracture of T12. IMPRESSION: 1. Pancoast tumor extending from the medial right upper lobe into the right posterior superior mediastinum. Mass measures 6.1 x 4.7 x 4.9 cm. Mass abuts and may invade the esophagus and posterior trachea. Mass abuts the central right subclavian artery. The mass causes erosion of the right anterior aspects of the C7, T1 and T2 vertebra. 2. There is an 11 mm nodule in the left upper lobe consistent with contralateral metastatic disease. A more ground-glass type area of opacity in the left upper lobe is noted that is likely scarring. No other convincing lung metastatic disease or metastatic disease in the chest. 3. Bilateral adrenal masses are similar to the prior CT which suggests bilateral adenomas. Metastatic disease is not excluded but felt less likely. No other evidence of metastatic disease in the abdomen or pelvis. Electronically Signed   By: Lajean Manes M.D.   On: 04/29/2016 17:17   Ct Cervical Spine Wo Contrast  04/22/2016  CLINICAL DATA:  Neck  pain for 6 months. Right shoulder pain for 2 weeks. EXAM: CT CERVICAL SPINE WITHOUT CONTRAST TECHNIQUE: Multidetector CT imaging of the cervical spine was performed without intravenous contrast. Multiplanar CT image reconstructions were also generated. COMPARISON:  Radiographs dated 04/15/2016 FINDINGS: There is a Pancoast tumor at the right lung apex medially measuring approximately 5.5 x 3.6 cm in the axial plane. The inferior extent of the  tumor is not apparent on this cervical CT scan. The mass extends into the mediastinum and deviates the trachea anteriorly and deviates the esophagus to the left. Fat planes between the mass and the trachea and the esophagus are obliterated. The tumor invades the C7, T1 and T2 vertebra with destruction portions of those vertebra. There is an area of lucency in the posterior were cortex is C6 which may also represent de tumor extension. Tumor extends into the right neural foramen at C7-T1 and at T1-2 and extends into the spinal canal at T1-2. There is tumor invasion of the posterior medial aspects of the right first and second ribs with destruction of a portion of the right transverse process of T1. There is abnormal soft tissue and some bone destruction in left mastoid air cells with disruption of the cortex just posterior to the external auditory canal. I suspect this represents metastatic disease. The left internal and external auditory canals and middle ear cavity are clear. There is degenerative disc disease primarily at C5-6 through T1-2 with bilateral foraminal stenosis at C5-6 and right foraminal stenosis at C6-7. No supraclavicular adenopathy. IMPRESSION: 1. 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 2. Possible metastatic lesion in the left mastoid air cells. This is less likely a cholesteatoma. Electronically Signed   By: Lorriane Shire M.D.   On:  04/22/2016 13:17   Ct Abdomen Pelvis W Contrast  04/29/2016  CLINICAL DATA:  Pancoast tumor of right lung [facility]) history of aneurysm repair. EXAM: CT CHEST, ABDOMEN, AND PELVIS WITH CONTRAST TECHNIQUE: Multidetector CT imaging of the chest, abdomen and pelvis was performed following the standard protocol during bolus administration of intravenous contrast. CONTRAST:  170m ISOVUE-300 IOPAMIDOL (ISOVUE-300) INJECTION 61% COMPARISON:  CTA, 11/21/2015 FINDINGS: CT CHEST Neck base and axilla: There is abnormal soft tissue with associated bone resorption on the right at the cervical thoracic junction involving C7, T1 and T2. The soft tissue component at the cervical thoracic junction on the right contiguous with the involved vertebra, measures 3.4 x 1.6 cm. No other neck base abnormalities. Mediastinum and hila: Right posterior mediastinal mass, which may reflect a right upper lobe mass invading the mediastinum, measures 6.1 cm superior inferior by 4.7 x 4.9 cm transversely. The mass is contiguous with the right margin of the esophagus, which is displaced to the left. It is also contiguous with the posterior aspect of the trachea that has a mild anterior convex bulge. The mass is contiguous with the posterior margin of the central right subclavian artery. Lungs and pleura: There is irregular ground-glass opacity in a peribronchovascular distribution in the left upper lobe, centered on image 29, series 6, measuring 16 mm greatest transverse dimension. There is a solid lobulated nodule in the left upper lobe, image 73, measuring 11 mm. Reticular type opacity is seen adjacent to the right medial upper lobe/mediastinal mass. There is linear/discoid type opacity in the lower lungs most consistent with atelectasis/ scarring. Mild to moderate changes of centrilobular emphysema are noted most evident in the upper lobes. No pleural effusion. No pneumothorax. There are changes from previous CABG surgery. CT ABDOMEN AND PELVIS  Hepatobiliary: No liver mass or lesion. Small dependent gallstones. Gallbladder otherwise unremarkable. No bile duct dilation. Spleen and pancreas:  Unremarkable. Adrenal glands: Bilateral adrenal masses, larger on the right measuring 2.5 cm, similar to the prior CT. Kidneys, ureters, bladder: 11 cm  midpole right renal cyst without change from the prior study. No other renal masses. No hydronephrosis. Ureters are normal in course and in caliber. Bladder is unremarkable. Prostate gland:  Enlarged but stable. Lymph nodes:  No adenopathy. Vascular: Diffuse aortic ectasia with atherosclerotic calcifications. No change from the prior study. Ascites:  None. Gastrointestinal:  Unremarkable.  Normal appendix visualized. MUSCULOSKELETAL As described on the chest, there is resorption of portions of the right C7, T1 and T2 vertebrae via the above described mass. No other osteolytic lesions. There is a stable moderate compression fracture of T12. IMPRESSION: 1. Pancoast tumor extending from the medial right upper lobe into the right posterior superior mediastinum. Mass measures 6.1 x 4.7 x 4.9 cm. Mass abuts and may invade the esophagus and posterior trachea. Mass abuts the central right subclavian artery. The mass causes erosion of the right anterior aspects of the C7, T1 and T2 vertebra. 2. There is an 11 mm nodule in the left upper lobe consistent with contralateral metastatic disease. A more ground-glass type area of opacity in the left upper lobe is noted that is likely scarring. No other convincing lung metastatic disease or metastatic disease in the chest. 3. Bilateral adrenal masses are similar to the prior CT which suggests bilateral adenomas. Metastatic disease is not excluded but felt less likely. No other evidence of metastatic disease in the abdomen or pelvis. Electronically Signed   By: Lajean Manes M.D.   On: 04/29/2016 17:17   Nm Pet Image Initial (pi) Skull Base To Thigh  05/10/2016  CLINICAL DATA:  Initial  treatment strategy for RIGHT upper lobe and the mediastinal mass. EXAM: NUCLEAR MEDICINE PET SKULL BASE TO THIGH TECHNIQUE: 9.5 mCi F-18 FDG was injected intravenously. Full-ring PET imaging was performed from the skull base to thigh after the radiotracer. CT data was obtained and used for attenuation correction and anatomic localization. FASTING BLOOD GLUCOSE:  Value: 211 mg/dl COMPARISON:  CT thorax 04/29/2016 FINDINGS: NECK No hypermetabolic lymph nodes in the neck. CHEST RIGHT upper lobe mass invading the mediastinum is intensely metabolic with SUV max equals 16.4. The metabolic activity is relatively homogeneous throughout the mass lesion. The lesion measures 4.8 by a 4.1 cm and axial dimension. The mass deviates the trachea and esophagus leftward. There are no hypermetabolic mediastinal lymph nodes. Lobular nodule in the LEFT upper lobe measuring 10 mm (image 76, series 4) with SUV max equal 5.8. Ground-glass nodule in the LEFT upper lobe measuring 15 mm also has associated metabolic activity with SUV max of 2.7 which is high for the low degree of cellularity of this lesion. Of note, there is a well-circumscribed new lesion in the RIGHT lower paratracheal station measuring 3.5 x 2.4 cm with thick rim and internal gas and semi-solid material (image 67, series 4). This presumably represents a post procedural collection following a bronchoscopy and lymph node sampling. There is no significant metabolic activity associated with this lesion which would indicate that lesion is not superinfected. ABDOMEN/PELVIS There is thickening of LEFT and RIGHT adrenal gland. Adrenal glands have low-attenuation consists with adenomas. Neither of these enlarged gland significant metabolic activity above background liver activity. The RIGHT gland does have a focus nodularity with higher density. No abnormal metabolic activity liver. No hypermetabolic abdominal pelvic lymph nodes. Atherosclerotic calcification aorta. SKELETON No  focal hypermetabolic activity to suggest skeletal metastasis. IMPRESSION: 1. Intensely hypermetabolic RIGHT upper lobe mass invading the mediastinum consistent with malignant Pancoast tumor. Metabolic activity is uniform throughout the lesion. 2. No evidence  of metastatic adenopathy in the mediastinum. 3. Two LEFT upper lobe pulmonary nodules are concerning for metastatic disease. 4. New gas and semi-solid tissue collection in the RIGHT lower paratracheal location is presumed sequelae of the bronchoscopy and lymph node sampling. This collection is rather large at 3.5 cm but no significant metabolic activity which suggests a sterile collection. No pneumothorax. 5. Bilateral adrenal adenomas. These results will be called to the ordering clinician or representative by the Radiologist Assistant, and communication documented in the PACS or zVision Dashboard. Electronically Signed   By: Suzy Bouchard M.D.   On: 05/10/2016 14:50   Dg Chest Port 1 View  05/04/2016  CLINICAL DATA:  SOB. Hx of CAD, HTN, AND DM. Pt is a former smoker. Subsequent encounter. EXAM: PORTABLE CHEST 1 VIEW COMPARISON:  05/03/2016 FINDINGS: Cardiac silhouette is mildly enlarged. Stable changes from CABG surgery are noted. No mediastinal or hilar masses. Medial right upper lobe mass is less well visualized currently due to rotation to the right. There is no significant change. Lungs otherwise clear. No convincing pleural effusion or pneumothorax. IMPRESSION: 1. No convincing acute cardiopulmonary disease. No change from previous day's study. 2. Medial right upper lobe mass consistent with a Pancoast tumor. 3. State cardiomegaly. Electronically Signed   By: Lajean Manes M.D.   On: 05/04/2016 11:13   Dg Chest Portable 1 View  05/03/2016  CLINICAL DATA:  Post surgery, possible pneumothorax EXAM: PORTABLE CHEST 1 VIEW COMPARISON:  05/02/2016 FINDINGS: Cardiomediastinal silhouette is stable. Persistent soft tissue density in right apex medially.  Status post CABG. Single lead cardiac pacemaker is unchanged in position. Mild basilar atelectasis. There is no evidence of pneumothorax. IMPRESSION: Persistent soft tissue density in right apex medially. Status post CABG. Single lead cardiac pacemaker is unchanged in position. Mild basilar atelectasis. There is no evidence of pneumothorax. Electronically Signed   By: Lahoma Crocker M.D.   On: 05/03/2016 14:27    Labs:  CBC:  Recent Labs  02/29/16 0519 05/02/16 1214 05/03/16 1255 05/04/16 0537 05/17/16 0930  WBC 8.1 11.2*  --  10.1 12.2*  HGB 11.0* 12.2* 11.6* 10.5* 11.8*  HCT 36.2* 39.4 34.0* 35.1* 38.7*  PLT 184 281  --  207 203    COAGS:  Recent Labs  02/29/16 0519 03/01/16 0530 05/02/16 1214 05/17/16 0930  INR 2.99* 2.87* 1.33 1.36  APTT  --   --  28 28    BMP:  Recent Labs  02/29/16 0519 04/22/16 0846 05/02/16 1214 05/03/16 1255 05/04/16 0537  NA 139 139 138 137 134*  K 4.4 4.2 4.5 4.4 4.2  CL 105 101 96*  --  98*  CO2 27 29 32  --  29  GLUCOSE 155* 152* 247* 223* 125*  BUN 21* 24* 35*  --  22*  CALCIUM 9.2 9.7 9.5  --  9.1  CREATININE 1.15 1.18 1.44*  --  0.94  GFRNONAA 58* 56* 44*  --  >60  GFRAA >60 >60 51*  --  >60    LIVER FUNCTION TESTS:  Recent Labs  02/28/16 1137 05/02/16 1214  BILITOT 0.7 1.4*  AST 28 27  ALT 25 25  ALKPHOS 68 94  PROT 6.8 6.9  ALBUMIN 3.2* 3.3*    TUMOR MARKERS: No results for input(s): AFPTM, CEA, CA199, CHROMGRNA in the last 8760 hours.  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 All of the patient's questions were answered, patient is agreeable to proceed. Consent signed and in chart.   Thank you for this interesting  consult.  I greatly enjoyed meeting George Pollard and look forward to participating in their care.  A copy of this report was sent to the requesting provider on this date.  Electronically Signed: Monia Sabal A 05/17/2016, 10:42 AM   I spent a total of  30 Minutes   in face to face in clinical consultation, greater than 50% of which was counseling/coordinating care for Right lung mass bx

## 2016-05-17 NOTE — Sedation Documentation (Signed)
Patient denies pain and is resting comfortably.  

## 2016-05-17 NOTE — Progress Notes (Signed)
Dr Jacqualyn Posey contacted, notified pt will have PICC placed Monday and wants to know when to resume coumadin, ok for pt to resume coumadin today

## 2016-05-17 NOTE — Procedures (Signed)
Interventional Radiology Procedure Note  Procedure: CT guided biopsy of right pancoast tumor  Complications: None Recommendations: - Bedrest until CXR cleared.  Minimize talking, coughing or otherwise straining.  - Follow up 1 hr CXR pending   Signed,  Todd Argabright S. Earleen Newport, DO

## 2016-05-18 NOTE — Progress Notes (Signed)
Chemo teaching to be done by chemo nurse from [place]. Chemo teaching sheets given to patient and include: Constipation Sheet, Diarrhea Sheet, Nausea Sheet, What to Know During Chemo Sheet, What to Know After Chemo, Dr. Donald Pore advice, & Self Care Activities While on Chemo sheet. The below was also given to patient. Calendar given to patient.    Your Chemotherapy Regimen: Carboplatin (Paraplatin) and Paclitaxel (Taxol) Your Oncologist: Dr. [facility] Contact Information: [phone]  Description of your regimen: You will be receiving 2 drugs as part of your chemotherapy regimen.  These drugs include carboplatin and paclitaxel.  Paclitaxel (PAK li TAXS el) is a chemotherapy drug. It is used to treat breast cancer, lung cancer, and other cancers. Carboplatin (KAR boe pla tin) is a chemotherapy drug. It targets fast dividing cells, like cancer cells, and causes these cells to die. This medicine is used to treat lung cancer, ovarian cancer and many other cancers. These medicines may be used for other purposes; ask your health care provider, pharmacist, or nurse if you have questions.   Regimen Schedule: Your doctor may use the word 'regimen' (eg the carbo/taxol regimen) when talking about your chemotherapy. This means the whole plan or schedule of your particular chemotherapy treatment.   Carboplatin and Paclitaxel every 7 days through the completion of Radiation.   How you will receive your regimen:  These drugs are usually given as an infusion into a vein. You will receive these drugs through your IV line/port-a-cath.  These drugs may be administered in a hospital or at the outpatient infusion clinic by a specially trained health care professional.   Common side effects of your regimen:  Each person's reaction to chemotherapy is different. Some people have very few side effects, while others may experience more. The side effects described here won't affect everyone having  carboplatin and paclitaxel chemotherapy.       . Allergic Reaction o Paclitaxel may cause an allergic reaction while being given o Signs of an allergic reaction may include shortness of breath, chills, headaches, fevers, feeling sick, skin rashes, itching, and/or pain in your back, stomach or chest o You will be given medications to decrease the chances of a reaction occurring o Please notify your nurse or doctor if you experience any of these symptoms during or after the infusion  . Low blood cell counts  o Low red blood cell counts - You may also hear this referred to as "anemia" - Red blood cells carry oxygen around the body - Low red blood cell counts may make you feel tired or breathless - If your red blood cell count becomes too low, you may have to receive a blood transfusion   o Low white blood cell counts - You may also hear this referred to as "neutropenia" - White blood cells help fight infection - Low white blood cell counts may make you more prone to infection - Your white blood cell count is usually at its lowest 10-14 days after chemotherapy  o Low platelet counts - You may also hear this referred to as "thrombocytopenia" - Platelets help your blood clot - Low platelet counts may make you more prone to bruising and bleeding   . Nausea and vomiting o Your doctor has likely already prescribed very effective drugs to help prevent nausea and vomiting from occurring while you are receiving chemotherapy. If you experience nausea and/or vomiting, let your doctor or nurse know.  . Tiredness/fatigue  o Feeling tired is a common  side effect of chemotherapy especially towards the end of treatment and for some weeks after it's over.   . Sore Mouth o Your mouth may become sore or dry; small ulcers may also form in your mouth o Drink plenty of fluids and clean your teeth regularly  o Notify your nurse or doctor of any mouth sores  . Hair loss  o This typically occurs 2-3  weeks after chemotherapy begins. Hair usually falls completely out. Almost always, your hair will grow back 2-3 months after chemotherapy is over.   . Numbness or tingling of the hands and feet o Due to the effects paclitaxel has on your nerves o You may notice that you have difficulty buttoning up buttons or doing other similar tasks o This usually improves slowly months after treatment is finished, but in some people it may never go away o Notify your doctor if you experience numbness or tingling of your hands and feet  . Nail Changes o Your nails may become brittle, discolored or break easily  o These changes are usually not permanent and usually grow out over several months after treatment finishes  * This is NOT a comprehensive list of side effects. We have outlined some of the most common side effects but haven't included other less common side effects. If you notice any side effects that aren't listed here, discuss them with your doctor, chemotherapy nurse or pharmacist. You may report side effects to FDA at 1-800-FDA-1088.   Other medications that you may be receiving to help support you while you are receiving chemotherapy: . Medications to decrease the chance of an allergic reaction . Medications to prevent nausea and vomiting . Fluids to help prevent dehydration . Medications to help your white blood cell count from going too low              Please alert [place] if: . You have any blood disorders  . You have hearing problems  . You have kidney disease  . You have recently received or receiving ongoing radiation therapy  . You have any allergies (including foods, drugs, preservatives, etc.)  . You are pregnant or trying to get pregnant  . You are breast-feeding . You are taking any medications (including all herbals, topicals, eye drops, etc.) In the hospital, a pharmacy medication reconciliation technician has likely already been by to obtain a list of medications you  are taking. If this has not occurred or you remember something else you are taking, please notify your nurse.     When you leave the [place]: The situations listed below are considered emergencies. If you experience any of these you should contact your doctor immediately. . Fevers of greater than 100.5 or higher . Nausea and vomiting of coffee colored material or large volumes of blood . Black, or very bloody bowel movements . Severe and persistent chest pain . Shortness of breath (when you cannot catch your breath) . Nausea or vomiting that prevents you from keeping any fluids or medication in your stomach . Severe headache, severe confusion, seizures or symptoms of a stroke such as weakness on one side of your body or weakness of both legs . Difficulty walking  . Difficulty urinating               NOTE: This sheet is a summary. It may not cover all possible information. If you have questions about your medicines, talk to your doctor, pharmacist, or nurse.

## 2016-05-18 NOTE — Patient Instructions (Addendum)
Chemo teaching to be done by chemo nurse from [place]. Chemo teaching sheets given to patient and include: Constipation Sheet, Diarrhea Sheet, Nausea Sheet, What to Know During Chemo Sheet, What to Know After Chemo, Dr. Donald Pore advice, & Self Care Activities While on Chemo sheet. The below was also given to patient. Calendar given to patient.    Your Chemotherapy Regimen: Carboplatin (Paraplatin) and Paclitaxel (Taxol) Your Oncologist: Dr. [facility] Contact Information: [phone]  Description of your regimen: You will be receiving 2 drugs as part of your chemotherapy regimen.  These drugs include carboplatin and paclitaxel.  Paclitaxel (PAK li TAXS el) is a chemotherapy drug. It is used to treat breast cancer, lung cancer, and other cancers. Carboplatin (KAR boe pla tin) is a chemotherapy drug. It targets fast dividing cells, like cancer cells, and causes these cells to die. This medicine is used to treat lung cancer, ovarian cancer and many other cancers. These medicines may be used for other purposes; ask your health care provider, pharmacist, or nurse if you have questions.   Regimen Schedule: Your doctor may use the word 'regimen' (eg the carbo/taxol regimen) when talking about your chemotherapy. This means the whole plan or schedule of your particular chemotherapy treatment.   Carboplatin and Paclitaxel every 7 days through the completion of Radiation.   How you will receive your regimen:  These drugs are usually given as an infusion into a vein. You will receive these drugs through your IV line/port-a-cath.  These drugs may be administered in a hospital or at the outpatient infusion clinic by a specially trained health care professional.   Common side effects of your regimen:  Each person's reaction to chemotherapy is different. Some people have very few side effects, while others may experience more. The side effects described here won't affect everyone having  carboplatin and paclitaxel chemotherapy.       . Allergic Reaction o Paclitaxel may cause an allergic reaction while being given o Signs of an allergic reaction may include shortness of breath, chills, headaches, fevers, feeling sick, skin rashes, itching, and/or pain in your back, stomach or chest o You will be given medications to decrease the chances of a reaction occurring o Please notify your nurse or doctor if you experience any of these symptoms during or after the infusion  . Low blood cell counts  o Low red blood cell counts - You may also hear this referred to as "anemia" - Red blood cells carry oxygen around the body - Low red blood cell counts may make you feel tired or breathless - If your red blood cell count becomes too low, you may have to receive a blood transfusion   o Low white blood cell counts - You may also hear this referred to as "neutropenia" - White blood cells help fight infection - Low white blood cell counts may make you more prone to infection - Your white blood cell count is usually at its lowest 10-14 days after chemotherapy  o Low platelet counts - You may also hear this referred to as "thrombocytopenia" - Platelets help your blood clot - Low platelet counts may make you more prone to bruising and bleeding   . Nausea and vomiting o Your doctor has likely already prescribed very effective drugs to help prevent nausea and vomiting from occurring while you are receiving chemotherapy. If you experience nausea and/or vomiting, let your doctor or nurse know.  . Tiredness/fatigue  o Feeling tired is a common  side effect of chemotherapy especially towards the end of treatment and for some weeks after it's over.   . Sore Mouth o Your mouth may become sore or dry; small ulcers may also form in your mouth o Drink plenty of fluids and clean your teeth regularly  o Notify your nurse or doctor of any mouth sores  . Hair loss  o This typically occurs 2-3  weeks after chemotherapy begins. Hair usually falls completely out. Almost always, your hair will grow back 2-3 months after chemotherapy is over.   . Numbness or tingling of the hands and feet o Due to the effects paclitaxel has on your nerves o You may notice that you have difficulty buttoning up buttons or doing other similar tasks o This usually improves slowly months after treatment is finished, but in some people it may never go away o Notify your doctor if you experience numbness or tingling of your hands and feet  . Nail Changes o Your nails may become brittle, discolored or break easily  o These changes are usually not permanent and usually grow out over several months after treatment finishes  * This is NOT a comprehensive list of side effects. We have outlined some of the most common side effects but haven't included other less common side effects. If you notice any side effects that aren't listed here, discuss them with your doctor, chemotherapy nurse or pharmacist. You may report side effects to FDA at 1-800-FDA-1088.   Other medications that you may be receiving to help support you while you are receiving chemotherapy: . Medications to decrease the chance of an allergic reaction . Medications to prevent nausea and vomiting . Fluids to help prevent dehydration . Medications to help your white blood cell count from going too low    Please alert [place] if: . You have any blood disorders  . You have hearing problems  . You have kidney disease  . You have recently received or receiving ongoing radiation therapy  . You have any allergies (including foods, drugs, preservatives, etc.)  . You are pregnant or trying to get pregnant  . You are breast-feeding . You are taking any medications (including all herbals, topicals, eye drops, etc.) In the hospital, a pharmacy medication reconciliation technician has likely already been by to obtain a list of medications you are taking. If this  has not occurred or you remember something else you are taking, please notify your nurse.     When you leave the [place]: The situations listed below are considered emergencies. If you experience any of these you should contact your doctor immediately. . Fevers of greater than 100.5 or higher . Nausea and vomiting of coffee colored material or large volumes of blood . Black, or very bloody bowel movements . Severe and persistent chest pain . Shortness of breath (when you cannot catch your breath) . Nausea or vomiting that prevents you from keeping any fluids or medication in your stomach . Severe headache, severe confusion, seizures or symptoms of a stroke such as weakness on one side of your body or weakness of both legs . Difficulty walking  . Difficulty urinating         NOTE: This sheet is a summary. It may not cover all possible information. If you have questions about your medicines, talk to your doctor, pharmacist, or nurse.

## 2016-05-19 NOTE — Addendum Note (Signed)
Encounter addended by: Tyler Pita, MD on: 05/19/2016  1:43 PM<BR>     Documentation filed: Follow-up Section, LOS Section, Visit Diagnoses, Problem List

## 2016-05-20 ENCOUNTER — Other Ambulatory Visit (HOSPITAL_COMMUNITY): Payer: Self-pay | Admitting: Hematology & Oncology

## 2016-05-20 ENCOUNTER — Other Ambulatory Visit (HOSPITAL_COMMUNITY): Payer: Self-pay | Admitting: *Deleted

## 2016-05-20 ENCOUNTER — Ambulatory Visit (HOSPITAL_COMMUNITY)
Admission: RE | Admit: 2016-05-20 | Discharge: 2016-05-20 | Disposition: A | Discharge status: Home / Self Care | Payer: Medicare Other | Source: Ambulatory Visit | Attending: Hematology & Oncology | Admitting: Hematology & Oncology

## 2016-05-20 ENCOUNTER — Ambulatory Visit: Payer: Medicare Other | Admitting: Radiation Oncology

## 2016-05-20 ENCOUNTER — Other Ambulatory Visit (HOSPITAL_COMMUNITY): Payer: Self-pay | Admitting: Oncology

## 2016-05-20 ENCOUNTER — Ambulatory Visit
Admission: RE | Admit: 2016-05-20 | Discharge: 2016-05-20 | Disposition: A | Discharge status: Home / Self Care | Payer: Medicare Other | Source: Ambulatory Visit | Attending: Radiation Oncology | Admitting: Radiation Oncology

## 2016-05-20 DIAGNOSIS — C3411 Malignant neoplasm of upper lobe, right bronchus or lung: Secondary | ICD-10-CM | POA: Diagnosis present

## 2016-05-20 DIAGNOSIS — Z7189 Other specified counseling: Secondary | ICD-10-CM | POA: Insufficient documentation

## 2016-05-20 MED ORDER — SODIUM CHLORIDE 0.9% FLUSH: 10.0000 mL | INTRAVENOUS | Status: DC | PRN

## 2016-05-20 MED ORDER — NORMAL SALINE FLUSH 0.9 % IV SOLN: INTRAVENOUS | Status: DC

## 2016-05-20 MED ORDER — HEPARIN LOCK FLUSH 100 UNIT/ML IV SOLN: INTRAVENOUS | Status: DC

## 2016-05-20 MED ORDER — SODIUM CHLORIDE 0.9% FLUSH
10.0000 mL | Freq: Two times a day (BID) | INTRAVENOUS | Status: DC
Start: 2016-05-20 — End: 2016-05-21

## 2016-05-20 NOTE — Progress Notes (Signed)
Peripherally Inserted Central Catheter/Midline Placement  The IV Nurse has discussed with the patient and/or persons authorized to consent for the patient, the purpose of this procedure and the potential benefits and risks involved with this procedure.  The benefits include less needle sticks, lab draws from the catheter and patient may be discharged home with the catheter.  Risks include, but not limited to, infection, bleeding, blood clot (thrombus formation), and puncture of an artery; nerve damage and irregular heat beat.  Alternatives to this procedure were also discussed.  PICC/Midline Placement Documentation  PICC Single Lumen 00/51/10 PICC Right Basilic 39 cm 0 cm (Active)  Indication for Insertion or Continuance of Line Prolonged intravenous therapies 05/20/2016  2:47 PM  Exposed Catheter (cm) 0 cm 05/20/2016  2:47 PM  Site Assessment Clean;Dry;Intact 05/20/2016  2:47 PM  Line Status Flushed;Blood return noted 05/20/2016  2:47 PM  Dressing Type Transparent;Securing device 05/20/2016  2:47 PM  Dressing Status Clean;Dry;Intact;Antimicrobial disc in place 05/20/2016  2:47 PM  Line Care Connections checked and tightened 05/20/2016  2:47 PM  Dressing Intervention New dressing 05/20/2016  2:47 PM  Dressing Change Due 05/27/16 05/20/2016  2:47 PM       Hillery Jacks 05/20/2016, 2:49 PM

## 2016-05-20 NOTE — Discharge Instructions (Signed)
PICC Insertion, Care After Refer to this sheet in the next few weeks. These instructions provide you with information on caring for yourself after your procedure. Your health care provider may also give you more specific instructions. Your treatment has been planned according to current medical practices, but problems sometimes occur. Call your health care provider if you have any problems or questions after your procedure. WHAT TO EXPECT AFTER THE PROCEDURE After your procedure, it is typical to have the following:  Mild discomfort at the insertion site. This should not last more than a day. HOME CARE INSTRUCTIONS  Rest at home for the remainder of the day after the procedure.  You may bend your arm and move it freely. If your PICC is near or at the bend of your elbow, avoid activity with repeated motion at the elbow.  Avoid lifting heavy objects as instructed by your health care provider.  Avoid using a crutch with the arm on the same side as your PICC. You may need to use a walker. Bandage Care  Keep your PICC bandage (dressing) clean and dry to prevent infection.  Ask your health care provider when you may shower. To keep the dressing dry, cover the PICC with plastic wrap and tape before showering. If the dressing does become wet, replace it right after the shower.  Do not soak in the bath, swim, or use hot tubs when you have a PICC.  Change the PICC dressing as instructed by your health care provider.  Change your PICC dressing if it becomes loose or wet. General PICC Care  Check the PICC insertion site daily for leakage, redness, swelling, or pain.  Flush the PICC as directed by your health care provider. Let your health care provider know right away if the PICC is difficult to flush or does not flush. Do not use force to flush the PICC.  Do not use a syringe that is less than 10 mL to flush the PICC.  Never pull or tug on the PICC.  Avoid blood pressure checks on the arm  with the PICC.  Keep your PICC identification card with you at all times.  Do not take the PICC out yourself. Only a trained health care professional should remove the PICC. SEEK MEDICAL CARE IF:  You have pain in your arm, ear, face, or teeth.  You have fever or chills.  You have drainage from the PICC insertion site.  You have redness or palpate a "cord" around the PICC insertion site.  You cannot flush the catheter. SEEK IMMEDIATE MEDICAL CARE IF:  You have swelling in the arm in which the PICC is inserted.   This information is not intended to replace advice given to you by your health care provider. Make sure you discuss any questions you have with your health care provider.   Document Released: 09/22/2013 Document Revised: 12/07/2013 Document Reviewed: 09/22/2013 Elsevier Interactive Patient Education [employer].

## 2016-05-21 ENCOUNTER — Ambulatory Visit
Admission: RE | Admit: 2016-05-21 | Discharge: 2016-05-21 | Disposition: A | Discharge status: Home / Self Care | Payer: Medicare Other | Source: Ambulatory Visit | Attending: Radiation Oncology | Admitting: Radiation Oncology

## 2016-05-21 ENCOUNTER — Other Ambulatory Visit (HOSPITAL_COMMUNITY): Payer: Self-pay | Admitting: Oncology

## 2016-05-21 ENCOUNTER — Ambulatory Visit: Payer: Medicare Other | Admitting: Radiation Oncology

## 2016-05-21 DIAGNOSIS — C3411 Malignant neoplasm of upper lobe, right bronchus or lung: Secondary | ICD-10-CM

## 2016-05-22 ENCOUNTER — Telehealth: Payer: Self-pay | Admitting: *Deleted

## 2016-05-22 ENCOUNTER — Encounter (HOSPITAL_COMMUNITY): Payer: Medicare Other | Attending: Hematology & Oncology | Admitting: Hematology & Oncology

## 2016-05-22 ENCOUNTER — Encounter (HOSPITAL_COMMUNITY): Payer: Medicare Other

## 2016-05-22 VITALS — BP 124/66 | HR 76 | Temp 98.5°F | Resp 18 | Wt 207.3 lb

## 2016-05-22 DIAGNOSIS — C3411 Malignant neoplasm of upper lobe, right bronchus or lung: Secondary | ICD-10-CM

## 2016-05-22 DIAGNOSIS — M25511 Pain in right shoulder: Secondary | ICD-10-CM

## 2016-05-22 DIAGNOSIS — R29898 Other symptoms and signs involving the musculoskeletal system: Secondary | ICD-10-CM

## 2016-05-22 DIAGNOSIS — M6289 Other specified disorders of muscle: Secondary | ICD-10-CM

## 2016-05-22 DIAGNOSIS — R918 Other nonspecific abnormal finding of lung field: Secondary | ICD-10-CM

## 2016-05-22 DIAGNOSIS — G63 Polyneuropathy in diseases classified elsewhere: Secondary | ICD-10-CM | POA: Insufficient documentation

## 2016-05-22 DIAGNOSIS — L8993 Pressure ulcer of unspecified site, stage 3: Secondary | ICD-10-CM | POA: Insufficient documentation

## 2016-05-22 DIAGNOSIS — Z452 Encounter for adjustment and management of vascular access device: Secondary | ICD-10-CM | POA: Insufficient documentation

## 2016-05-22 DIAGNOSIS — C801 Malignant (primary) neoplasm, unspecified: Secondary | ICD-10-CM | POA: Insufficient documentation

## 2016-05-22 MED ORDER — FENTANYL 25 MCG/HR TD PT72: MEDICATED_PATCH | TRANSDERMAL | Status: DC

## 2016-05-22 NOTE — Telephone Encounter (Signed)
Called patient, but per patient's wife, he cannot speak.  Patient's wife reports that the patient signed a DPR form at his most recent [place] appointment at [facility].  Advised that I will attempt to obtain a copy of his DPR form so that I can speak with her.  Patient's wife verbalizes understanding.

## 2016-05-22 NOTE — Telephone Encounter (Signed)
Spoke with Sharyn Lull at [facility].  Sharyn Lull states that there is a copy of patient's DPR that has not been scanned in yet at their office.  She will fax to our office.  HCPOA form received designating patient's wife as his HCPOA.  Called patient's wife to make her aware of Dr. Tanna Furry recommendations to send a remote transmission 2-3 weeks after patient's last radiation treatment appointment.  Patient's wife reports that patient's last treatment is scheduled for 07/04/16.  Patient is scheduled for an appointment with Dr. Lovena Le in [place] on 07/29/16 so we will check his device then.  Patient's wife is aware that he does not need to send transmission prior to this appointment unless his final radiation appointment date changes.  She is appreciative of call and denies additional questions at this time.

## 2016-05-22 NOTE — Progress Notes (Signed)
Pt attended chemo class in [facility] on 05/21/16.

## 2016-05-22 NOTE — Progress Notes (Signed)
[facility]  Progress Note  Patient Care Team: Monico Blitz, MD as PCP - General (Internal Medicine)  CHIEF COMPLAINTS:  CT Cervical Spine 04/22/2016 showed a pancoast tumor in the right lung apex invading the mediastinum and the C7, T1 and T2 vertebra with extension into the neural foramina and into the spinal canal.  Consistent Right shoulder pain for the last 6 months    Pancoast tumor of right lung ([place])   04/29/2016 Imaging Pancoast tumor extending from medial RUL into R posterior superior mediastinum. abuts and may invade esoph and post trachea, abuts central R subclavian artery, erosion of the R anterior aspects of the C7, T1 and T2 vertebra   05/03/2016 Pathology Results Not diagnostic   05/03/2016 Procedure Video bronchoscopy, Transbronchial ultrasound guided biopsy of 4R LN, mediastinoscopy with direct biopsy of 2R LN   05/10/2016 PET scan intensely hypermetabolic RUL mass invading mediastinum c/w pancoast tumor. 2 LUL pulmonary nodules. Bilateral adrenal adenomas   05/17/2016 Procedure CT guided biopsy of right pancoast tumor by IR   05/20/2016 Pathology Results Lung, needle/core biopsy(ies), Right Upper Lobe SQUAMOUS CELL CARCINOMA   05/23/2016 -  Radiation Therapy XRT Dr. Tyler Pita    HISTORY OF PRESENTING ILLNESS:  George Pollard 82 y.o. male is here for ongoing follow-up of R pancoast tumor. PET also suggested 2 LUL pulmonary nodules. He has been simulated by Dr. Tammi Klippel, plan is to initiated XRT tomorrow. Biopsy was obtained by IR with final pathology c/w squamous cell carcinoma.   George Pollard is accompanied by his wife and daughter. I personally reviewed and went over pathology results with the patient.  He currently rates his pain as a 4 on scale of 10. He has a 12 mcg fentanyl patch on. He often wakes up in pain to the point it is unbearable.   He has a sore throat. He does not have the appetite he used to have. He makes himself eat. His wife and daughter  actively remind him to eat throughout the day.  His right arm is sore at his access site. He would not like a port placed at this time. He notes however that he may be open to port placement in the future.   He is frustrated because he is unable to be active. He was extremely active prior to becoming sick. He had recently gutted and completely remodeled a home.   No nausea, vomiting. No diarrhea. Pain as detailed. He is open to increasing his fentanyl.   MEDICAL HISTORY:  Past Medical History  Diagnosis Date  . CAD (coronary artery disease)     a. CABG 1997. b. NSTEMI (troponin >20) s/p PTCA/DES to LCx 10/21/12, overlapping DES to mid & distal RCA 10/26/12; c. 02/2016 MV: EF 37%, large fixed defect - apex, inf, septal, lat wall, no ischemia.  . Atrial fibrillation ([place])   . Hypertension   . BPH (benign prostatic hyperplasia)   . PVD (peripheral vascular disease) ([place])     a. AAA repair 2001.  Marland Kitchen Systolic CHF ([place])     a. 10/2012: acute on chronic systolic CHF / pulmonary edema secondary to severe mixed cardiomyopathy;  b. 02/2016 Echo: EF 30-35%, sev dil RV, RV dysfxn, massively dil RA, PASP 10mHg.  . Diabetes mellitus ([place]     a. Noted 10/2012 (prior hx of DM resolved with weight loss).  . Ventricular tachycardia ([place]     a. 02/2016 4 ICD shocks for VT-->Amio added.  .Marland KitchenAICD (automatic cardioverter/defibrillator)  present   . Shortness of breath dyspnea   . Anxiety   . Lung cancer ([place])     pancoast tumor right lung apex invading mediastinum and C7, T1, T2 with extension into the neural foramina and into the spinal canal    SURGICAL HISTORY: Past Surgical History  Procedure Laterality Date  . Coronary artery bypass graft      1997  . Abdominal aortic aneurysm repair      08/26/2000  . Cataract extraction    . Tonsillectomy    . Left heart catheterization with coronary/graft angiogram N/A 10/21/2012    Procedure: LEFT HEART CATHETERIZATION WITH Beatrix Fetters;  Surgeon:  Burnell Blanks, MD;  Location: [facility] CATH LAB;  Service: Cardiovascular;  Laterality: N/A;  . Percutaneous coronary stent intervention (pci-s) N/A 10/26/2012    Procedure: PERCUTANEOUS CORONARY STENT INTERVENTION (PCI-S);  Surgeon: Sherren Mocha, MD;  Location[facility] C[facility] LAB;  Service: Cardiovascular;  Laterality: N/A;  . Cardiac catheterization    . Mediastinoscopy N/A 05/03/2016    Procedure: MEDIASTINOSCOPY;  Surgeon: Ivin Poot, MD;  Location: [facility];  Service: Thoracic;  Laterality: N/A;  . Video bronchoscopy with endobronchial ultrasound N/A 05/03/2016    Procedure: VIDEO BRONCHOSCOPY WITH ENDOBRONCHIAL ULTRASOUND;  Surgeon: Ivin Poot, MD;  Location: [facility] OR;  Service: Thoracic;  Laterality: N/A;    SOCIAL HISTORY: Social History   Social History  . Marital Status: Married    Spouse Name: N/A  . Number of Children: 4  . Years of Education: N/A   Occupational History  . Not on file.   Social History Main Topics  . Smoking status: Former Smoker -- 2.00 packs/day for 20 years    Types: Cigarettes    Quit date: 12/17/1995  . Smokeless tobacco: Never Used     Comment: Quit 1997  . Alcohol Use: No  . Drug Use: No  . Sexual Activity: No   Other Topics Concern  . Not on file   Social History Narrative   Lives in [place].  Lives with wife.     Married 34 years 4 children 8 grandchildren 73 great-grandchildren Ex smoker, quit in 1997. He was a heavy smoker, at least 2 ppd. Started at 27-16 yo. Worked most years at a [employer]. 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  He does carpentry as a hobby.   FAMILY HISTORY: Family History  Problem Relation Age of Onset  . Valvular heart disease Daughter     MVP repair   Father died at 65 yo. Received artifical knee replacement and fell after which broke it up. He had it replaced then fell again. At this point he just sat and gained weight. Alzheimer's. Had an  aneurysm. Mother died at 28 yo. She had a heart attack in her 5s. Congestive heart failure. 2 brothers and 2 sisters total siblings. All 4 siblings have had aneurysms. 3 had surgeries, and 1 was too unhealthy for surgery. All were smokers.  2 brothers are still living.  ALLERGIES:  has No Known Allergies.  MEDICATIONS:  Current Outpatient Prescriptions  Medication Sig Dispense Refill  . amiodarone (PACERONE) 200 MG tablet Take 1 tablet by mouth 2 (two) times daily.  0  . aspirin EC 81 MG tablet Take 81 mg by mouth daily.    Marland Kitchen atorvastatin (LIPITOR) 80 MG tablet take 1 tablet by mouth once daily 30 tablet 2  . CARBOPLATIN IV Inject into the  vein. To be given weekly with radiation    . dexamethasone (DECADRON) 2 MG tablet Take 1 tablet (2 mg total) by mouth 2 (two) times daily with a meal. 30 tablet 0  . fentaNYL (DURAGESIC - DOSED MCG/HR) 12 MCG/HR Place 1 patch (12.5 mcg total) onto the skin every 3 (three) days. 10 patch 0  . fentaNYL (DURAGESIC - DOSED MCG/HR) 25 MCG/HR patch Place one patch on skin and change every 72 hours combine with 12 mcg patch for a total of 37 mcg 10 patch 0  . furosemide (LASIX) 40 MG tablet Take 1 tablet (40 mg total) by mouth 2 (two) times daily. 180 tablet 3  . Heparin Lock Flush (HEPARIN FLUSH, PORCINE,) 100 UNIT/ML injection Flush PICC line twice a week. First flush with Saline. Then Flush PICC line with 2.58m of Heparin. 16 Syringe 0  . HYDROcodone-acetaminophen (NORCO) 10-325 MG tablet Take 1 tablet by mouth every 4 (four) hours as needed. 90 tablet 0  . lisinopril (PRINIVIL,ZESTRIL) 2.5 MG tablet take 1 tablet by mouth once daily 30 tablet 2  . metFORMIN (GLUCOPHAGE) 500 MG tablet Take 500 mg by mouth daily.   0  . Metoprolol Tartrate (LOPRESSOR) 50 MG tablet Take 1.5 tablets (75 mg total) by mouth 2 (two) times daily. 90 tablet 6  . nitroGLYCERIN (NITROSTAT) 0.4 MG SL tablet Place 1 tablet (0.4 mg total) under the tongue every 5 (five) minutes as needed  for chest pain (up to 3 doses). 25 tablet 2  . ondansetron (ZOFRAN) 8 MG tablet Take 1 tablet (8 mg total) by mouth every 8 (eight) hours as needed for nausea or vomiting. 30 tablet 2  . oxyCODONE (OXY IR/ROXICODONE) 5 MG immediate release tablet Take 1-2 tablets (5-10 mg total) by mouth every 6 (six) hours as needed for severe pain. 30 tablet 0  . PACLitaxel (TAXOL IV) Inject into the vein. To be given weekly with radiation    . potassium chloride SA (K-DUR,KLOR-CON) 20 MEQ tablet Take 1 tablet by mouth daily.  0  . prochlorperazine (COMPAZINE) 10 MG tablet Take 1 tablet (10 mg total) by mouth every 6 (six) hours as needed for nausea or vomiting. 30 tablet 2  . Sodium Chloride Flush (NORMAL SALINE FLUSH) 0.9 % SOLN Flush PICC line twice a week with 187mprior to flushing with Heparin. 16 Syringe 0  . warfarin (COUMADIN) 5 MG tablet Take 1 tablet (5 mg total) by mouth daily at 6 PM. 30 tablet 0   No current facility-administered medications for this visit.   Facility-Administered Medications Ordered in Other Visits  Medication Dose Route Frequency Provider Last Rate Last Dose  . CARBOplatin (PARAPLATIN) 170 mg in sodium chloride 0.9 % 100 mL chemo infusion  170 mg Intravenous Once ShPatrici RanksMD      . heparin lock flush 100 unit/mL  500 Units Intracatheter Once PRN ShPatrici RanksMD      . PACLitaxel (TAXOL) 96 mg in dextrose 5 % 250 mL chemo infusion (</= '80mg'$ /m2)  45 mg/m2 (Treatment Plan Actual) Intravenous Once ShPatrici RanksMD      . sodium chloride flush (NS) 0.9 % injection 10 mL  10 mL Intracatheter PRN ShPatrici RanksMD        Review of Systems  Constitutional: Positive for weight loss (5 lbs weight loss since 6/2) and malaise/fatigue.       Decreased appetite  HENT: Positive for sore throat.   Eyes: Negative.   Respiratory:  Negative.   Cardiovascular: Positive for leg swelling.       Occasional left leg swelling, managed with compression socks.   Gastrointestinal: Negative.   Genitourinary: Negative.   Musculoskeletal:       Consistent Right shoulder pain.  Skin: Negative.   Neurological: Positive for speech change, focal weakness and weakness.       Right arm and bilateral leg weakness. Hoarse voice  Endo/Heme/Allergies: Negative.   Psychiatric/Behavioral: Negative.   All other systems reviewed and are negative.  14 point ROS was done and is otherwise as detailed above or in HPI   PHYSICAL EXAMINATION: ECOG PERFORMANCE STATUS: 1 - Symptomatic but completely ambulatory  Filed Vitals:   05/22/16 0940  BP: 124/66  Pulse: 76  Temp: 98.5 F (36.9 C)  Resp: 18   Filed Weights   05/22/16 0940  Weight: 207 lb 4.8 oz (94.031 kg)    Physical Exam  Constitutional: He is oriented to person, place, and time and well-developed, well-nourished, and in no distress.  HENT:  Head: Normocephalic and atraumatic.  Mouth/Throat: Oropharynx is clear and moist.  Eyes: Conjunctivae and EOM are normal. Pupils are equal, round, and reactive to light. Right eye exhibits no discharge. Left eye exhibits no discharge. No scleral icterus.  Neck: Normal range of motion. Neck supple. No JVD present. No tracheal deviation present. No thyromegaly present.  Cardiovascular: Normal rate, regular rhythm and normal heart sounds.   Pulmonary/Chest: Effort normal and breath sounds normal. No respiratory distress. He has no wheezes.  Abdominal: Soft. Bowel sounds are normal. He exhibits no distension and no mass. There is no tenderness. There is no rebound and no guarding.  Musculoskeletal: Normal range of motion. He exhibits no edema.  Compression hose LE bilaterally, muscle atrophy R scapular region  Lymphadenopathy:    He has no cervical adenopathy.  Neurological: He is alert and oriented to person, place, and time. He displays normal reflexes. No cranial nerve deficit. Gait normal. Coordination normal.  Right sided weakness. Weak hip flexors.   Skin: Skin is warm and dry.  Psychiatric: Memory, affect and judgment normal.  Nursing note and vitals reviewed.   LABORATORY DATA:  I have reviewed the data as listed Lab Results  Component Value Date   WBC 7.3 05/23/2016   HGB 11.4* 05/23/2016   HCT 36.0* 05/23/2016   MCV 91.6 05/23/2016   PLT 190 05/23/2016   CMP     Component Value Date/Time   NA 136 05/23/2016 0930   K 3.8 05/23/2016 0930   CL 99* 05/23/2016 0930   CO2 30 05/23/2016 0930   GLUCOSE 228* 05/23/2016 0930   BUN 28* 05/23/2016 0930   CREATININE 1.29* 05/23/2016 0930   CALCIUM 8.9 05/23/2016 0930   PROT 6.7 05/23/2016 0930   ALBUMIN 3.0* 05/23/2016 0930   AST 30 05/23/2016 0930   ALT 38 05/23/2016 0930   ALKPHOS 104 05/23/2016 0930   BILITOT 1.2 05/23/2016 0930   GFRNONAA 51* 05/23/2016 0930   GFRAA 59* 05/23/2016 0930     RADIOGRAPHIC STUDIES: I have personally reviewed the radiological images as listed and agreed with the findings in the report. Dg Chest Port 1 View  05/20/2016  CLINICAL DATA:  PICC line placement. EXAM: PORTABLE CHEST 1 VIEW COMPARISON:  Chest x-ray 05/17/2016 FINDINGS: The right sided PICC line tip is in the mid distal SVC. No complicating features. The permanent left-sided pacemaker is stable. The heart is enlarged but unchanged. Stable surgical changes from bypass surgery. Stable right  apical lung mass. IMPRESSION: Right PICC line tip in good position in the mid distal SVC. No complicating features. Electronically Signed   By: Marijo Sanes M.D.   On: 05/20/2016 15:08    Study Result     CLINICAL DATA: Initial treatment strategy for RIGHT upper lobe and the mediastinal mass.  EXAM: NUCLEAR MEDICINE PET SKULL BASE TO THIGH  TECHNIQUE: 9.5 mCi F-18 FDG was injected intravenously. Full-ring PET imaging was performed from the skull base to thigh after the radiotracer. CT data was obtained and used for attenuation correction and anatomic localization.  FASTING BLOOD  GLUCOSE: Value: 211 mg/dl  COMPARISON: CT thorax 04/29/2016  FINDINGS: NECK  No hypermetabolic lymph nodes in the neck.  CHEST  RIGHT upper lobe mass invading the mediastinum is intensely metabolic with SUV max equals 16.4. The metabolic activity is relatively homogeneous throughout the mass lesion. The lesion measures 4.8 by a 4.1 cm and axial dimension. The mass deviates the trachea and esophagus leftward.  There are no hypermetabolic mediastinal lymph nodes.  Lobular nodule in the LEFT upper lobe measuring 10 mm (image 76, series 4) with SUV max equal 5.8. Ground-glass nodule in the LEFT upper lobe measuring 15 mm also has associated metabolic activity with SUV max of 2.7 which is high for the low degree of cellularity of this lesion.  Of note, there is a well-circumscribed new lesion in the RIGHT lower paratracheal station measuring 3.5 x 2.4 cm with thick rim and internal gas and semi-solid material (image 67, series 4). This presumably represents a post procedural collection following a bronchoscopy and lymph node sampling. There is no significant metabolic activity associated with this lesion which would indicate that lesion is not superinfected.  ABDOMEN/PELVIS  There is thickening of LEFT and RIGHT adrenal gland. Adrenal glands have low-attenuation consists with adenomas. Neither of these enlarged gland significant metabolic activity above background liver activity. The RIGHT gland does have a focus nodularity with higher density.  No abnormal metabolic activity liver. No hypermetabolic abdominal pelvic lymph nodes. Atherosclerotic calcification aorta.  SKELETON  No focal hypermetabolic activity to suggest skeletal metastasis.  IMPRESSION: 1. Intensely hypermetabolic RIGHT upper lobe mass invading the mediastinum consistent with malignant Pancoast tumor. Metabolic activity is uniform throughout the lesion. 2. No evidence of metastatic  adenopathy in the mediastinum. 3. Two LEFT upper lobe pulmonary nodules are concerning for metastatic disease. 4. New gas and semi-solid tissue collection in the RIGHT lower paratracheal location is presumed sequelae of the bronchoscopy and lymph node sampling. This collection is rather large at 3.5 cm but no significant metabolic activity which suggests a sterile collection. No pneumothorax. 5. Bilateral adrenal adenomas. These results will be called to the ordering clinician or representative by the Radiologist Assistant, and communication documented in the PACS or zVision Dashboard.   Electronically Signed  By: Suzy Bouchard M.D.  On: 05/10/2016 14:50   PATHOLOGY   ASSESSMENT & PLAN:  R Pancoast Tumor CT Cervical Spine 04/22/2016 showed a pancoast tumor in the right lung apex invading the mediastinum and the C7, T1 and T2 vertebra with extension into the neural foramina and into the spinal canal.  Consistent Right shoulder pain for the last 6 months R arm/hand weakness History of tobacco use  The patient is scheduled to begin Cycle #1 Carboplatin / Paclitaxel tomorrow. He will begin XRT tomorrow morning, as well. Plan is for weekly carbo/taxol during the duration of radiation. He has undergone chemotherapy teaching.  I personally reviewed and went over pathology  results with the patient.   He has poor pain management at this time. I will write the patient for 25 mcg fentanyl patches. I advised the patient he should add the 25 mcg patch in addition to his 12 mcg patch. We will adjust his pain medication accordingly. We may consider the addition of neurontin or lyrica as I suspect he has a neuropathic component to his pain.  He will return for follow up in one week with his next chemotherapy.  All questions were answered. The patient knows to call the clinic with any problems, questions or concerns.  This document serves as a record of services personally performed by  Ancil Linsey, MD. It was created on her behalf by Arlyce Harman, a trained medical scribe. The creation of this record is based on the scribe's personal observations and the provider's statements to them. This document has been checked and approved by the attending provider.  I have reviewed the above documentation for accuracy and completeness, and I agree with the above.  This note was electronically signed.    Molli Hazard, MD  05/23/2016 11:37 AM

## 2016-05-23 ENCOUNTER — Encounter (HOSPITAL_COMMUNITY): Payer: Self-pay

## 2016-05-23 ENCOUNTER — Ambulatory Visit: Admission: RE | Admit: 2016-05-23 | Payer: Medicare Other | Source: Ambulatory Visit | Admitting: Radiation Oncology

## 2016-05-23 ENCOUNTER — Ambulatory Visit
Admission: RE | Admit: 2016-05-23 | Discharge: 2016-05-23 | Disposition: A | Discharge status: Home / Self Care | Payer: Medicare Other | Source: Ambulatory Visit | Attending: Radiation Oncology | Admitting: Radiation Oncology

## 2016-05-23 ENCOUNTER — Encounter (HOSPITAL_BASED_OUTPATIENT_CLINIC_OR_DEPARTMENT_OTHER): Payer: Medicare Other

## 2016-05-23 ENCOUNTER — Inpatient Hospital Stay (HOSPITAL_COMMUNITY): Payer: Medicare Other

## 2016-05-23 ENCOUNTER — Encounter (HOSPITAL_COMMUNITY): Payer: Self-pay | Admitting: Hematology & Oncology

## 2016-05-23 VITALS — BP 128/69 | HR 82 | Temp 97.6°F | Resp 16 | Wt 207.0 lb

## 2016-05-23 DIAGNOSIS — R918 Other nonspecific abnormal finding of lung field: Secondary | ICD-10-CM | POA: Diagnosis present

## 2016-05-23 DIAGNOSIS — Z5111 Encounter for antineoplastic chemotherapy: Secondary | ICD-10-CM

## 2016-05-23 DIAGNOSIS — C3411 Malignant neoplasm of upper lobe, right bronchus or lung: Secondary | ICD-10-CM | POA: Diagnosis present

## 2016-05-23 DIAGNOSIS — L8993 Pressure ulcer of unspecified site, stage 3: Secondary | ICD-10-CM | POA: Diagnosis present

## 2016-05-23 DIAGNOSIS — Z510 Encounter for antineoplastic radiation therapy: Secondary | ICD-10-CM | POA: Diagnosis not present

## 2016-05-23 DIAGNOSIS — Z452 Encounter for adjustment and management of vascular access device: Secondary | ICD-10-CM | POA: Diagnosis present

## 2016-05-23 DIAGNOSIS — G63 Polyneuropathy in diseases classified elsewhere: Secondary | ICD-10-CM | POA: Diagnosis present

## 2016-05-23 DIAGNOSIS — C801 Malignant (primary) neoplasm, unspecified: Secondary | ICD-10-CM | POA: Diagnosis present

## 2016-05-23 LAB — COMPREHENSIVE METABOLIC PANEL
ALK PHOS: 104 U/L (ref 38–126)
ALT: 38 U/L (ref 17–63)
AST: 30 U/L (ref 15–41)
Albumin: 3 g/dL — ABNORMAL LOW (ref 3.5–5.0)
Anion gap: 7 (ref 5–15)
BILIRUBIN TOTAL: 1.2 mg/dL (ref 0.3–1.2)
BUN: 28 mg/dL — ABNORMAL HIGH (ref 6–20)
CALCIUM: 8.9 mg/dL (ref 8.9–10.3)
CO2: 30 mmol/L (ref 22–32)
CREATININE: 1.29 mg/dL — AB (ref 0.61–1.24)
Chloride: 99 mmol/L — ABNORMAL LOW (ref 101–111)
GFR, EST AFRICAN AMERICAN: 59 mL/min — AB (ref >60)
GFR, EST NON AFRICAN AMERICAN: 51 mL/min — AB (ref >60)
Glucose, Bld: 228 mg/dL — ABNORMAL HIGH (ref 65–99)
Potassium: 3.8 mmol/L (ref 3.5–5.1)
Sodium: 136 mmol/L (ref 135–145)
TOTAL PROTEIN: 6.7 g/dL (ref 6.5–8.1)

## 2016-05-23 LAB — CBC WITH DIFFERENTIAL/PLATELET
BASOS PCT: 0 %
Basophils Absolute: 0 10*3/uL (ref 0.0–0.1)
EOS ABS: 0.1 10*3/uL (ref 0.0–0.7)
Eosinophils Relative: 1 %
HCT: 36 % — ABNORMAL LOW (ref 39.0–52.0)
Hemoglobin: 11.4 g/dL — ABNORMAL LOW (ref 13.0–17.0)
Lymphocytes Relative: 18 %
Lymphs Abs: 1.3 10*3/uL (ref 0.7–4.0)
MCH: 29 pg (ref 26.0–34.0)
MCHC: 31.7 g/dL (ref 30.0–36.0)
MCV: 91.6 fL (ref 78.0–100.0)
MONO ABS: 0.2 10*3/uL (ref 0.1–1.0)
MONOS PCT: 3 %
Neutro Abs: 5.7 10*3/uL (ref 1.7–7.7)
Neutrophils Relative %: 78 %
PLATELETS: 190 10*3/uL (ref 150–400)
RBC: 3.93 MIL/uL — ABNORMAL LOW (ref 4.22–5.81)
RDW: 17 % — ABNORMAL HIGH (ref 11.5–15.5)
WBC: 7.3 10*3/uL (ref 4.0–10.5)

## 2016-05-23 LAB — GLUCOSE, RANDOM: GLUCOSE: 181 mg/dL — AB (ref 65–99)

## 2016-05-23 MED ORDER — HEPARIN SOD (PORK) LOCK FLUSH 100 UNIT/ML IV SOLN
500.0000 [IU] | Freq: Once | INTRAVENOUS | Status: AC | PRN
  Administered 2016-05-23: 250 [IU]
  Filled 2016-05-23: qty 5

## 2016-05-23 MED ORDER — HYDROCODONE-ACETAMINOPHEN 10-325 MG PO TABS: 1.0000 | ORAL_TABLET | ORAL | Status: DC | PRN

## 2016-05-23 MED ORDER — SODIUM CHLORIDE 0.9 % IV SOLN
20.0000 mg | Freq: Once | INTRAVENOUS | Status: AC
  Administered 2016-05-23: 20 mg via INTRAVENOUS
  Filled 2016-05-23: qty 2

## 2016-05-23 MED ORDER — DIPHENHYDRAMINE HCL 50 MG/ML IJ SOLN
50.0000 mg | Freq: Once | INTRAMUSCULAR | Status: AC
  Administered 2016-05-23: 50 mg via INTRAVENOUS
  Filled 2016-05-23: qty 1

## 2016-05-23 MED ORDER — INSULIN ASPART 100 UNIT/ML ~~LOC~~ SOLN
4.0000 [IU] | Freq: Once | SUBCUTANEOUS | Status: AC
  Administered 2016-05-23: 4 [IU] via SUBCUTANEOUS
  Filled 2016-05-23 (×2): qty 0.04

## 2016-05-23 MED ORDER — SODIUM CHLORIDE 0.9 % IV SOLN
Freq: Once | INTRAVENOUS | Status: AC
  Administered 2016-05-23: 11:00:00 via INTRAVENOUS

## 2016-05-23 MED ORDER — PACLITAXEL CHEMO INJECTION 300 MG/50ML
45.0000 mg/m2 | Freq: Once | INTRAVENOUS | Status: AC
  Administered 2016-05-23: 96 mg via INTRAVENOUS
  Filled 2016-05-23: qty 16

## 2016-05-23 MED ORDER — SODIUM CHLORIDE 0.9 % IV SOLN
174.2000 mg | Freq: Once | INTRAVENOUS | Status: AC
  Administered 2016-05-23: 170 mg via INTRAVENOUS
  Filled 2016-05-23: qty 17

## 2016-05-23 MED ORDER — FAMOTIDINE IN NACL 20-0.9 MG/50ML-% IV SOLN
20.0000 mg | Freq: Once | INTRAVENOUS | Status: AC
  Administered 2016-05-23: 20 mg via INTRAVENOUS
  Filled 2016-05-23: qty 50

## 2016-05-23 MED ORDER — SODIUM CHLORIDE 0.9% FLUSH
10.0000 mL | INTRAVENOUS | Status: DC | PRN
Start: 2016-05-23 — End: 2016-05-23
  Administered 2016-05-23: 10 mL
  Filled 2016-05-23: qty 10

## 2016-05-23 MED ORDER — PALONOSETRON HCL INJECTION 0.25 MG/5ML
0.2500 mg | Freq: Once | INTRAVENOUS | Status: AC
  Administered 2016-05-23: 0.25 mg via INTRAVENOUS
  Filled 2016-05-23: qty 5

## 2016-05-23 NOTE — Patient Instructions (Signed)
[facility] Discharge Instructions for Patients Receiving Chemotherapy   Beginning January 23rd 2017 lab work for the [facility] will be done in the  Main lab at [facility] on 1st floor. If you have a lab appointment with the [facility] please come in thru the  Main Entrance and check in at the main information desk   Today you received the following chemotherapy agents taxol and carboplatin  PICC line dressing will be changed weekly (we will do that) Flush PICC line with 73m of normal saline then 2.5 mL of heparin  Taxol and cNorma Fredricksonwill be given weekly    Paclitaxel injection What is this medicine? PACLITAXEL (PAK li TAX el) is a chemotherapy drug. It targets fast dividing cells, like cancer cells, and causes these cells to die. This medicine is used to treat ovarian cancer, breast cancer, and other cancers. This medicine may be used for other purposes; ask your health care provider or pharmacist if you have questions. What should I tell my health care provider before I take this medicine? They need to know if you have any of these conditions: -blood disorders -irregular heartbeat -infection (especially a virus infection such as chickenpox, cold sores, or herpes) -liver disease -previous or ongoing radiation therapy -an unusual or allergic reaction to paclitaxel, alcohol, polyoxyethylated castor oil, other chemotherapy agents, other medicines, foods, dyes, or preservatives -pregnant or trying to get pregnant -breast-feeding How should I use this medicine? This drug is given as an infusion into a vein. It is administered in a hospital or clinic by a specially trained health care professional. Talk to your pediatrician regarding the use of this medicine in children. Special care may be needed. Overdosage: If you think you have taken too much of this medicine contact a poison control center or emergency room at once. NOTE: This medicine is only for you. Do not  share this medicine with others. What if I miss a dose? It is important not to miss your dose. Call your doctor or health care professional if you are unable to keep an appointment. What may interact with this medicine? Do not take this medicine with any of the following medications: -disulfiram -metronidazole This medicine may also interact with the following medications: -cyclosporine -diazepam -ketoconazole -medicines to increase blood counts like filgrastim, pegfilgrastim, sargramostim -other chemotherapy drugs like cisplatin, doxorubicin, epirubicin, etoposide, teniposide, vincristine -quinidine -testosterone -vaccines -verapamil Talk to your doctor or health care professional before taking any of these medicines: -acetaminophen -aspirin -ibuprofen -ketoprofen -naproxen This list may not describe all possible interactions. Give your health care provider a list of all the medicines, herbs, non-prescription drugs, or dietary supplements you use. Also tell them if you smoke, drink alcohol, or use illegal drugs. Some items may interact with your medicine. What should I watch for while using this medicine? Your condition will be monitored carefully while you are receiving this medicine. You will need important blood work done while you are taking this medicine. This drug may make you feel generally unwell. This is not uncommon, as chemotherapy can affect healthy cells as well as cancer cells. Report any side effects. Continue your course of treatment even though you feel ill unless your doctor tells you to stop. This medicine can cause serious allergic reactions. To reduce your risk you will need to take other medicine(s) before treatment with this medicine. In some cases, you may be given additional medicines to help with side effects. Follow all directions for their use. Call  your doctor or health care professional for advice if you get a fever, chills or sore throat, or other symptoms  of a cold or flu. Do not treat yourself. This drug decreases your body's ability to fight infections. Try to avoid being around people who are sick. This medicine may increase your risk to bruise or bleed. Call your doctor or health care professional if you notice any unusual bleeding. Be careful brushing and flossing your teeth or using a toothpick because you may get an infection or bleed more easily. If you have any dental work done, tell your dentist you are receiving this medicine. Avoid taking products that contain aspirin, acetaminophen, ibuprofen, naproxen, or ketoprofen unless instructed by your doctor. These medicines may hide a fever. Do not become pregnant while taking this medicine. Women should inform their doctor if they wish to become pregnant or think they might be pregnant. There is a potential for serious side effects to an unborn child. Talk to your health care professional or pharmacist for more information. Do not breast-feed an infant while taking this medicine. Men are advised not to father a child while receiving this medicine. This product may contain alcohol. Ask your pharmacist or healthcare provider if this medicine contains alcohol. Be sure to tell all healthcare providers you are taking this medicine. Certain medicines, like metronidazole and disulfiram, can cause an unpleasant reaction when taken with alcohol. The reaction includes flushing, headache, nausea, vomiting, sweating, and increased thirst. The reaction can last from 30 minutes to several hours. What side effects may I notice from receiving this medicine? Side effects that you should report to your doctor or health care professional as soon as possible: -allergic reactions like skin rash, itching or hives, swelling of the face, lips, or tongue -low blood counts - This drug may decrease the number of white blood cells, red blood cells and platelets. You may be at increased risk for infections and bleeding. -signs  of infection - fever or chills, cough, sore throat, pain or difficulty passing urine -signs of decreased platelets or bleeding - bruising, pinpoint red spots on the skin, black, tarry stools, nosebleeds -signs of decreased red blood cells - unusually weak or tired, fainting spells, lightheadedness -breathing problems -chest pain -high or low blood pressure -mouth sores -nausea and vomiting -pain, swelling, redness or irritation at the injection site -pain, tingling, numbness in the hands or feet -slow or irregular heartbeat -swelling of the ankle, feet, hands Side effects that usually do not require medical attention (report to your doctor or health care professional if they continue or are bothersome): -bone pain -complete hair loss including hair on your head, underarms, pubic hair, eyebrows, and eyelashes -changes in the color of fingernails -diarrhea -loosening of the fingernails -loss of appetite -muscle or joint pain -red flush to skin -sweating This list may not describe all possible side effects. Call your doctor for medical advice about side effects. You may report side effects to FDA at 1-800-FDA-1088. Where should I keep my medicine? This drug is given in a hospital or clinic and will not be stored at home. NOTE: This sheet is a summary. It may not cover all possible information. If you have questions about this medicine, talk to your doctor, pharmacist, or health care provider.    2016, Elsevier/Gold Standard. (2015-07-20 13:02:56)   To help prevent nausea and vomiting after your treatment, we encourage you to take your nausea medication  If you develop nausea and vomiting, or diarrhea that is  not controlled by your medication, call the clinic.    The clinic phone number is [phone]. Office hours are Monday-Friday 8:30am-5:00pm.  BELOW ARE SYMPTOMS THAT SHOULD BE REPORTED IMMEDIATELY:  *FEVER GREATER THAN 101.0 F  *CHILLS WITH OR WITHOUT FEVER  NAUSEA AND  VOMITING THAT IS NOT CONTROLLED WITH YOUR NAUSEA MEDICATION  *UNUSUAL SHORTNESS OF BREATH  *UNUSUAL BRUISING OR BLEEDING  TENDERNESS IN MOUTH AND THROAT WITH OR WITHOUT PRESENCE OF ULCERS  *URINARY PROBLEMS  *BOWEL PROBLEMS  UNUSUAL RASH Items with * indicate a potential emergency and should be followed up as soon as possible. If you have an emergency after office hours please contact your primary care physician or go to the nearest emergency department.  Please call the clinic during office hours if you have any questions or concerns.   You may also contact the Patient Navigator at [phone] should you have any questions or need assistance in obtaining follow up care.     Carboplatin injection What is this medicine? CARBOPLATIN (KAR boe pla tin) is a chemotherapy drug. It targets fast dividing cells, like cancer cells, and causes these cells to die. This medicine is used to treat ovarian cancer and many other cancers. This medicine may be used for other purposes; ask your health care provider or pharmacist if you have questions. What should I tell my health care provider before I take this medicine? They need to know if you have any of these conditions: -blood disorders -hearing problems -kidney disease -recent or ongoing radiation therapy -an unusual or allergic reaction to carboplatin, cisplatin, other chemotherapy, other medicines, foods, dyes, or preservatives -pregnant or trying to get pregnant -breast-feeding How should I use this medicine? This drug is usually given as an infusion into a vein. It is administered in a hospital or clinic by a specially trained health care professional. Talk to your pediatrician regarding the use of this medicine in children. Special care may be needed. Overdosage: If you think you have taken too much of this medicine contact a poison control center or emergency room at once. NOTE: This medicine is only for you. Do not share this  medicine with others. What if I miss a dose? It is important not to miss a dose. Call your doctor or health care professional if you are unable to keep an appointment. What may interact with this medicine? -medicines for seizures -medicines to increase blood counts like filgrastim, pegfilgrastim, sargramostim -some antibiotics like amikacin, gentamicin, neomycin, streptomycin, tobramycin -vaccines Talk to your doctor or health care professional before taking any of these medicines: -acetaminophen -aspirin -ibuprofen -ketoprofen -naproxen This list may not describe all possible interactions. Give your health care provider a list of all the medicines, herbs, non-prescription drugs, or dietary supplements you use. Also tell them if you smoke, drink alcohol, or use illegal drugs. Some items may interact with your medicine. What should I watch for while using this medicine? Your condition will be monitored carefully while you are receiving this medicine. You will need important blood work done while you are taking this medicine. This drug may make you feel generally unwell. This is not uncommon, as chemotherapy can affect healthy cells as well as cancer cells. Report any side effects. Continue your course of treatment even though you feel ill unless your doctor tells you to stop. In some cases, you may be given additional medicines to help with side effects. Follow all directions for their use. Call your doctor or health care professional for  advice if you get a fever, chills or sore throat, or other symptoms of a cold or flu. Do not treat yourself. This drug decreases your body's ability to fight infections. Try to avoid being around people who are sick. This medicine may increase your risk to bruise or bleed. Call your doctor or health care professional if you notice any unusual bleeding. Be careful brushing and flossing your teeth or using a toothpick because you may get an infection or bleed more  easily. If you have any dental work done, tell your dentist you are receiving this medicine. Avoid taking products that contain aspirin, acetaminophen, ibuprofen, naproxen, or ketoprofen unless instructed by your doctor. These medicines may hide a fever. Do not become pregnant while taking this medicine. Women should inform their doctor if they wish to become pregnant or think they might be pregnant. There is a potential for serious side effects to an unborn child. Talk to your health care professional or pharmacist for more information. Do not breast-feed an infant while taking this medicine. What side effects may I notice from receiving this medicine? Side effects that you should report to your doctor or health care professional as soon as possible: -allergic reactions like skin rash, itching or hives, swelling of the face, lips, or tongue -signs of infection - fever or chills, cough, sore throat, pain or difficulty passing urine -signs of decreased platelets or bleeding - bruising, pinpoint red spots on the skin, black, tarry stools, nosebleeds -signs of decreased red blood cells - unusually weak or tired, fainting spells, lightheadedness -breathing problems -changes in hearing -changes in vision -chest pain -high blood pressure -low blood counts - This drug may decrease the number of white blood cells, red blood cells and platelets. You may be at increased risk for infections and bleeding. -nausea and vomiting -pain, swelling, redness or irritation at the injection site -pain, tingling, numbness in the hands or feet -problems with balance, talking, walking -trouble passing urine or change in the amount of urine Side effects that usually do not require medical attention (report to your doctor or health care professional if they continue or are bothersome): -hair loss -loss of appetite -metallic taste in the mouth or changes in taste This list may not describe all possible side effects.  Call your doctor for medical advice about side effects. You may report side effects to FDA at 1-800-FDA-1088. Where should I keep my medicine? This drug is given in a hospital or clinic and will not be stored at home. NOTE: This sheet is a summary. It may not cover all possible information. If you have questions about this medicine, talk to your doctor, pharmacist, or health care provider.    2016, Elsevier/Gold Standard. (2008-03-08 14:38:05)     Resources For Cancer Patients and their Caregivers ? American Cancer Society: Can assist with transportation, wigs, general needs, runs Look Good Feel Better.        361-350-9969 ? Cancer Care: Provides financial assistance, online support groups, medication/co-pay assistance.  1-800-813-HOPE (917)165-4610) ? [place] Assists [place] cancer patients and their families through emotional , educational and financial support.  (986)147-7279 ? [place] DSS Where to apply for food stamps, Medicaid and utility assistance. 484-874-2578 ? [facility]: Transportation to medical appointments. 254-071-8833 ? Social Security Administration: May apply for disability if have a Stage IV cancer. (972)375-6689 (502) 087-1707 ? [employer], Disability and Transit Services: Assists with nutrition, care and transit needs. 731-885-0008

## 2016-05-23 NOTE — Progress Notes (Signed)
George Pollard Tolerated chemotherapy well  Discharged via wheelchair  Daughter George Pollard taught how to flush PICC line.  With 42m  Saline and 2.5 mL heparin verbalized understanding.

## 2016-05-24 ENCOUNTER — Encounter: Payer: Self-pay | Admitting: Radiation Oncology

## 2016-05-24 ENCOUNTER — Ambulatory Visit
Admission: RE | Admit: 2016-05-24 | Discharge: 2016-05-24 | Disposition: A | Discharge status: Home / Self Care | Payer: Medicare Other | Source: Ambulatory Visit | Attending: Radiation Oncology | Admitting: Radiation Oncology

## 2016-05-24 ENCOUNTER — Encounter (HOSPITAL_COMMUNITY): Payer: Self-pay | Admitting: *Deleted

## 2016-05-24 ENCOUNTER — Other Ambulatory Visit (HOSPITAL_COMMUNITY): Payer: Self-pay | Admitting: Oncology

## 2016-05-24 VITALS — BP 117/65 | HR 86 | Resp 16 | Wt 210.3 lb

## 2016-05-24 DIAGNOSIS — Z510 Encounter for antineoplastic radiation therapy: Secondary | ICD-10-CM | POA: Diagnosis not present

## 2016-05-24 DIAGNOSIS — C3411 Malignant neoplasm of upper lobe, right bronchus or lung: Secondary | ICD-10-CM

## 2016-05-24 NOTE — Progress Notes (Signed)
  Radiation Oncology         [phone]   Name: George Pollard MRN: [id number]   Date: 05/24/2016  DOB: October 09, 1933   Weekly Radiation Therapy Management    ICD-9-CM ICD-10-CM   1. Pancoast tumor of right lung (HCC) 162.3 C34.11     Current Dose: 4 Gy  Planned Dose:  66 Gy  Narrative The patient presents for routine under treatment assessment.  Weight and vitals stable. Denies pain. Reports SOB with exertion. Reports hoarseness has been present since surgery but, isn't any worse. Reports difficulty swallowing some foods since surgery, as well. Reports a productive cough with yellow sputum in the more but, clear sputum the rest of the day. Skin tear right forearm. Applied neosporin to the site, 2x2 gauze, and secured this with ace wrap.   The patient is without complaint. Set-up films were reviewed. The chart was checked.  Physical Findings  weight is 210 lb 4.8 oz (95.391 kg). His blood pressure is 117/65 and his pulse is 86. His respiration is 16 and oxygen saturation is 100%. . Weight essentially stable.  No significant changes.  Impression The patient is tolerating radiation.  Plan Continue treatment as planned.    Sheral Apley Tammi Klippel, M.D.   This document serves as a record of services personally performed by Tyler Pita, MD. It was created on his behalf by Derek Mound, a trained medical scribe. The creation of this record is based on the scribe's personal observations and the provider's statements to them. This document has been checked and approved by the attending provider.

## 2016-05-24 NOTE — Discharge Summary (Signed)
[address]       [place] [address]             [phone]      Physician Discharge Summary  Patient ID: George Pollard MRN: [id number] DOB/AGE: 1933-09-10 82 y.o.  Admit date: 05/03/2016 Discharge date: 05/04/2016 Admission Diagnoses: RIGHT PANCOAST TUMOR  Discharge Diagnoses:  Active Problems:   Surgery, elective  Patient Active Problem List   Diagnosis Date Noted  . PICC (peripherally inserted central catheter) flush 05/20/2016  . Pancoast tumor of right lung ([facility]) 05/17/2016  . Lung mass 05/10/2016  . Surgery, elective 05/03/2016  . Chronic atrial fibrillation ([place]) 03/01/2016  . Ventricular tachycardia ([place]) 03/01/2016  . Defibrillator discharge 02/28/2016  . Cardiomyopathy, ischemic 10/20/2012  . CAD (coronary artery disease) of artery bypass graft 10/20/2012  . NSTEMI (non-ST elevated myocardial infarction) ([place]) 10/19/2012   Discharged Condition: stable    HPI:82 year old Caucasian male reformed smoker presents with recent diagnosed right upper lobe mass with invasion into the posterior mediastinum and destruction of elements of T1 and C7-C6 vertebral bodies-Pancoast tumor. The patient has had right shoulder and arm pain and right arm weakness for several months. He has had shoulder injections without improvement. He denies weight loss , headache or change in vision, hemoptysis, or abdominal pain.  The patient recently had CT scan of the neck followed by CT scan of the chest showing a 6 cm mass at the right lung apex extending posteriorly against the trachea esophagus and vertebral bodies of C6-T1. Pain has had significant pain has been on narcotics. The patient presents for discussion of biopsy to establish tissue diagnosis before radiation therapy, probably palliative, can be initiated. The patient states he can no longer use his right arm [dominant] to feed himself.  Patient has a long history cardiac disease status post CABG 20 years  ago. He now has ejection fraction of 30-35% with stents placed in 2014. The patient has chronic atrial fibrillation and has been on Coumadin for several years. He stopped his Coumadin 3 days ago. He denies stroke from his atrial fib.   He was admitted for biopsies  Hospital Course: The patient was admitted for the below described procedure. He was tolerated well and he went to the postanesthesia care unit in stable condition  Postoperatively the patient has been stable. He did have some hoarseness which was new however having no difficulties with swallowing or breathing. His chest x-ray postoperatively was stable in appearance and he was overall felt to be stable for discharge on postoperative day #1.  Consults: None  Significant Diagnostic Studies: CXR  Treatments: surgery:  DATE OF PROCEDURE: 05/03/2016 DATE OF DISCHARGE:   OPERATIVE REPORT   PREOPERATIVE DIAGNOSIS: Right Pancoast tumor with severe nerve root pain from involvement of T1, C7, and C6 nerve roots.  POSTOPERATIVE DIAGNOSIS: Right Pancoast tumor with severe nerve root pain from involvement of T1, C7, and C6 nerve roots.  OPERATION: 1. Video bronchoscopy. 2. Transbronchial ultrasound guided biopsy of 4R lymph node. 3. Mediastinoscopy with direct biopsy of 2R lymph nodes.  ANESTHESIA: General. Discharge Exam: Blood pressure 133/68, pulse 85, temperature 97.7 F (36.5 C), temperature source Oral, resp. rate 15, height '5\' 10"'$  (1.778 m), weight 212 lb (96.163 kg), SpO2 94 %.   General: alert, no distress Lungs: dim in right upper fields Cor: irregular Dressing CDI, no hematoma  Disposition: 01-Home or Self Care     Medication List    STOP taking these  medications        oxyCODONE-acetaminophen 7.5-325 MG tablet  Commonly known as:  PERCOCET      TAKE these medications        amiodarone 200 MG tablet  Commonly known as:  PACERONE  Take 1 tablet by mouth 2 (two)  times daily.     aspirin EC 81 MG tablet  Take 81 mg by mouth daily.     atorvastatin 80 MG tablet  Commonly known as:  LIPITOR  take 1 tablet by mouth once daily     dexamethasone 2 MG tablet  Commonly known as:  DECADRON  Take 1 tablet (2 mg total) by mouth 2 (two) times daily with a meal.     furosemide 40 MG tablet  Commonly known as:  LASIX  Take 1 tablet (40 mg total) by mouth 2 (two) times daily.     lisinopril 2.5 MG tablet  Commonly known as:  PRINIVIL,ZESTRIL  take 1 tablet by mouth once daily     metFORMIN 500 MG tablet  Commonly known as:  GLUCOPHAGE  Take 500 mg by mouth daily.     metoprolol 50 MG tablet  Commonly known as:  LOPRESSOR  Take 1.5 tablets (75 mg total) by mouth 2 (two) times daily.     nitroGLYCERIN 0.4 MG SL tablet  Commonly known as:  NITROSTAT  Place 1 tablet (0.4 mg total) under the tongue every 5 (five) minutes as needed for chest pain (up to 3 doses).     oxyCODONE 5 MG immediate release tablet  Commonly known as:  Oxy IR/ROXICODONE  Take 1-2 tablets (5-10 mg total) by mouth every 6 (six) hours as needed for severe pain.     potassium chloride SA 20 MEQ tablet  Commonly known as:  K-DUR,KLOR-CON  Take 1 tablet by mouth daily.     warfarin 5 MG tablet  Commonly known as:  COUMADIN  Take 1 tablet (5 mg total) by mouth daily at 6 PM.           Follow-up Information    Follow up with Len Childs, MD.   Specialty:  Cardiothoracic Surgery   Why:  office will contact   Contact information:   [address] [place] [address] [phone]       Signed: John Giovanni 05/24/2016, 10:27 AM

## 2016-05-24 NOTE — Progress Notes (Signed)
Weight and vitals stable. Denies pain. Reports SOB with exertion. Reports hoarseness has been present since surgery but, isn't any worse. Reports difficulty swallowing some foods since surgery, as well. Reports a productive cough with yellow sputum in the more but, clear sputum the rest of the day. Skin tear right forearm. Applied neosporin to the site, 2x2 gauze, and secured this with ace wrap.   BP 117/65 mmHg  Pulse 86  Resp 16  Wt 210 lb 4.8 oz (95.391 kg)  SpO2 100% Wt Readings from Last 3 Encounters:  05/24/16 210 lb 4.8 oz (95.391 kg)  05/23/16 207 lb (93.895 kg)  05/22/16 207 lb 4.8 oz (94.031 kg)

## 2016-05-24 NOTE — Progress Notes (Signed)
Contacted pt for 24 hr f/u post first chemotherapy tx. Unable to reach pt and his voicemail box is full, so unable to leave message.

## 2016-05-27 ENCOUNTER — Ambulatory Visit
Admission: RE | Admit: 2016-05-27 | Discharge: 2016-05-27 | Disposition: A | Discharge status: Home / Self Care | Payer: Medicare Other | Source: Ambulatory Visit | Attending: Radiation Oncology | Admitting: Radiation Oncology

## 2016-05-27 DIAGNOSIS — R918 Other nonspecific abnormal finding of lung field: Secondary | ICD-10-CM

## 2016-05-27 DIAGNOSIS — Z510 Encounter for antineoplastic radiation therapy: Secondary | ICD-10-CM | POA: Diagnosis not present

## 2016-05-27 MED ORDER — RADIAPLEXRX EX GEL
Freq: Once | CUTANEOUS | Status: AC
  Administered 2016-05-27: 15:00:00 via TOPICAL

## 2016-05-28 ENCOUNTER — Ambulatory Visit
Admission: RE | Admit: 2016-05-28 | Discharge: 2016-05-28 | Disposition: A | Discharge status: Home / Self Care | Payer: Medicare Other | Source: Ambulatory Visit | Attending: Radiation Oncology | Admitting: Radiation Oncology

## 2016-05-28 ENCOUNTER — Other Ambulatory Visit (HOSPITAL_COMMUNITY): Payer: Self-pay | Admitting: *Deleted

## 2016-05-28 DIAGNOSIS — Z452 Encounter for adjustment and management of vascular access device: Secondary | ICD-10-CM

## 2016-05-28 DIAGNOSIS — R918 Other nonspecific abnormal finding of lung field: Secondary | ICD-10-CM

## 2016-05-28 DIAGNOSIS — C3411 Malignant neoplasm of upper lobe, right bronchus or lung: Secondary | ICD-10-CM

## 2016-05-28 DIAGNOSIS — Z510 Encounter for antineoplastic radiation therapy: Secondary | ICD-10-CM | POA: Diagnosis not present

## 2016-05-28 MED ORDER — HEPARIN LOCK FLUSH 100 UNIT/ML IV SOLN: INTRAVENOUS | Status: DC

## 2016-05-29 ENCOUNTER — Ambulatory Visit
Admission: RE | Admit: 2016-05-29 | Discharge: 2016-05-29 | Disposition: A | Discharge status: Home / Self Care | Payer: Medicare Other | Source: Ambulatory Visit | Attending: Radiation Oncology | Admitting: Radiation Oncology

## 2016-05-29 DIAGNOSIS — Z510 Encounter for antineoplastic radiation therapy: Secondary | ICD-10-CM | POA: Diagnosis not present

## 2016-05-30 ENCOUNTER — Encounter (HOSPITAL_BASED_OUTPATIENT_CLINIC_OR_DEPARTMENT_OTHER): Payer: Medicare Other

## 2016-05-30 ENCOUNTER — Encounter (HOSPITAL_BASED_OUTPATIENT_CLINIC_OR_DEPARTMENT_OTHER): Payer: Medicare Other | Admitting: Hematology & Oncology

## 2016-05-30 ENCOUNTER — Ambulatory Visit
Admission: RE | Admit: 2016-05-30 | Discharge: 2016-05-30 | Disposition: A | Discharge status: Home / Self Care | Payer: Medicare Other | Source: Ambulatory Visit | Attending: Radiation Oncology | Admitting: Radiation Oncology

## 2016-05-30 ENCOUNTER — Encounter (HOSPITAL_COMMUNITY): Payer: Self-pay | Admitting: Hematology & Oncology

## 2016-05-30 VITALS — BP 103/53 | HR 93 | Temp 98.0°F | Resp 20 | Wt 208.1 lb

## 2016-05-30 VITALS — BP 107/69 | HR 81 | Temp 98.0°F | Resp 20

## 2016-05-30 DIAGNOSIS — G63 Polyneuropathy in diseases classified elsewhere: Secondary | ICD-10-CM

## 2016-05-30 DIAGNOSIS — C7951 Secondary malignant neoplasm of bone: Secondary | ICD-10-CM

## 2016-05-30 DIAGNOSIS — C801 Malignant (primary) neoplasm, unspecified: Secondary | ICD-10-CM

## 2016-05-30 DIAGNOSIS — Z5111 Encounter for antineoplastic chemotherapy: Secondary | ICD-10-CM

## 2016-05-30 DIAGNOSIS — C3411 Malignant neoplasm of upper lobe, right bronchus or lung: Secondary | ICD-10-CM | POA: Diagnosis not present

## 2016-05-30 DIAGNOSIS — Z510 Encounter for antineoplastic radiation therapy: Secondary | ICD-10-CM | POA: Diagnosis not present

## 2016-05-30 DIAGNOSIS — G893 Neoplasm related pain (acute) (chronic): Secondary | ICD-10-CM

## 2016-05-30 DIAGNOSIS — D649 Anemia, unspecified: Secondary | ICD-10-CM | POA: Diagnosis not present

## 2016-05-30 DIAGNOSIS — R29898 Other symptoms and signs involving the musculoskeletal system: Secondary | ICD-10-CM

## 2016-05-30 LAB — COMPREHENSIVE METABOLIC PANEL
ALK PHOS: 90 U/L (ref 38–126)
ALT: 35 U/L (ref 17–63)
ANION GAP: 8 (ref 5–15)
AST: 27 U/L (ref 15–41)
Albumin: 2.8 g/dL — ABNORMAL LOW (ref 3.5–5.0)
BILIRUBIN TOTAL: 1.3 mg/dL — AB (ref 0.3–1.2)
BUN: 29 mg/dL — ABNORMAL HIGH (ref 6–20)
CALCIUM: 9.1 mg/dL (ref 8.9–10.3)
CO2: 30 mmol/L (ref 22–32)
CREATININE: 1.2 mg/dL (ref 0.61–1.24)
Chloride: 97 mmol/L — ABNORMAL LOW (ref 101–111)
GFR calc non Af Amer: 55 mL/min — ABNORMAL LOW (ref >60)
GLUCOSE: 234 mg/dL — AB (ref 65–99)
Potassium: 3.9 mmol/L (ref 3.5–5.1)
Sodium: 135 mmol/L (ref 135–145)
TOTAL PROTEIN: 6.3 g/dL — AB (ref 6.5–8.1)

## 2016-05-30 LAB — CBC WITH DIFFERENTIAL/PLATELET
Basophils Absolute: 0 10*3/uL (ref 0.0–0.1)
Basophils Relative: 0 %
Eosinophils Absolute: 0.1 10*3/uL (ref 0.0–0.7)
Eosinophils Relative: 1 %
HEMATOCRIT: 33.7 % — AB (ref 39.0–52.0)
HEMOGLOBIN: 11.2 g/dL — AB (ref 13.0–17.0)
LYMPHS ABS: 0.6 10*3/uL — AB (ref 0.7–4.0)
LYMPHS PCT: 9 %
MCH: 30 pg (ref 26.0–34.0)
MCHC: 33.2 g/dL (ref 30.0–36.0)
MCV: 90.3 fL (ref 78.0–100.0)
MONOS PCT: 9 %
Monocytes Absolute: 0.7 10*3/uL (ref 0.1–1.0)
NEUTROS ABS: 6 10*3/uL (ref 1.7–7.7)
NEUTROS PCT: 81 %
Platelets: 250 10*3/uL (ref 150–400)
RBC: 3.73 MIL/uL — AB (ref 4.22–5.81)
RDW: 16.7 % — ABNORMAL HIGH (ref 11.5–15.5)
WBC: 7.3 10*3/uL (ref 4.0–10.5)

## 2016-05-30 MED ORDER — HEPARIN SOD (PORK) LOCK FLUSH 100 UNIT/ML IV SOLN
500.0000 [IU] | Freq: Once | INTRAVENOUS | Status: AC | PRN
  Administered 2016-05-30: 250 [IU]
  Filled 2016-05-30: qty 5

## 2016-05-30 MED ORDER — GABAPENTIN 300 MG PO CAPS: 300.0000 mg | ORAL_CAPSULE | Freq: Every day | ORAL | Status: DC

## 2016-05-30 MED ORDER — SODIUM CHLORIDE 0.9 % IV SOLN
20.0000 mg | Freq: Once | INTRAVENOUS | Status: AC
  Administered 2016-05-30: 20 mg via INTRAVENOUS
  Filled 2016-05-30: qty 2

## 2016-05-30 MED ORDER — FAMOTIDINE IN NACL 20-0.9 MG/50ML-% IV SOLN
20.0000 mg | Freq: Once | INTRAVENOUS | Status: AC
  Administered 2016-05-30: 20 mg via INTRAVENOUS
  Filled 2016-05-30: qty 50

## 2016-05-30 MED ORDER — INSULIN ASPART 100 UNIT/ML ~~LOC~~ SOLN
4.0000 [IU] | Freq: Once | SUBCUTANEOUS | Status: AC
  Administered 2016-05-30: 4 [IU] via SUBCUTANEOUS
  Filled 2016-05-30: qty 0.04

## 2016-05-30 MED ORDER — PACLITAXEL CHEMO INJECTION 300 MG/50ML
45.0000 mg/m2 | Freq: Once | INTRAVENOUS | Status: AC
  Administered 2016-05-30: 96 mg via INTRAVENOUS
  Filled 2016-05-30: qty 16

## 2016-05-30 MED ORDER — FENTANYL 50 MCG/HR TD PT72: 50.0000 ug | MEDICATED_PATCH | TRANSDERMAL | Status: DC

## 2016-05-30 MED ORDER — PALONOSETRON HCL INJECTION 0.25 MG/5ML
0.2500 mg | Freq: Once | INTRAVENOUS | Status: AC
  Administered 2016-05-30: 0.25 mg via INTRAVENOUS
  Filled 2016-05-30: qty 5

## 2016-05-30 MED ORDER — CARBOPLATIN CHEMO INJECTION 450 MG/45ML
183.6000 mg | Freq: Once | INTRAVENOUS | Status: AC
  Administered 2016-05-30: 180 mg via INTRAVENOUS
  Filled 2016-05-30: qty 18

## 2016-05-30 MED ORDER — DIPHENHYDRAMINE HCL 50 MG/ML IJ SOLN
50.0000 mg | Freq: Once | INTRAMUSCULAR | Status: AC
  Administered 2016-05-30: 50 mg via INTRAVENOUS
  Filled 2016-05-30: qty 1

## 2016-05-30 MED ORDER — SODIUM CHLORIDE 0.9% FLUSH: 10.0000 mL | INTRAVENOUS | Status: DC | PRN

## 2016-05-30 MED ORDER — SODIUM CHLORIDE 0.9 % IV SOLN
Freq: Once | INTRAVENOUS | Status: AC
  Administered 2016-05-30: 10:00:00 via INTRAVENOUS

## 2016-05-30 NOTE — Patient Instructions (Addendum)
[place] at [facility] Discharge Instructions  RECOMMENDATIONS MADE BY THE CONSULTANT AND ANY TEST RESULTS WILL BE SENT TO YOUR REFERRING PHYSICIAN.  Exam done and seen today by Dr. Whitney Muse Will start you on Neurotin today, sent to pharmacy. '300mg'$  for 3-4 days and then increase to '600mg'$  after that. Will increase you patch to 50 mcg total. Will refer to [place] for PT for you hip Work on your bowels. Try to find something that works everyday. Labs reviewed and chemo today  Return to see the doctor next week, will see Tom Please call the clinic if you have any questions or concerns  Thank you for choosing [place] at [facility] to provide your oncology and hematology care.  To afford each patient quality time with our provider, please arrive at least 15 minutes before your scheduled appointment time.   Beginning January 23rd 2017 lab work for the [facility] will be done in the  Main lab at [facility] on 1st floor. If you have a lab appointment with the [facility] please come in thru the  Main Entrance and check in at the main information desk  You need to re-schedule your appointment should you arrive 10 or more minutes late.  We strive to give you quality time with our providers, and arriving late affects you and other patients whose appointments are after yours.  Also, if you no show three or more times for appointments you may be dismissed from the clinic at the providers discretion.     Again, thank you for choosing [facility].  Our hope is that these requests will decrease the amount of time that you wait before being seen by our physicians.       _____________________________________________________________  Should you have questions after your visit to [facility], please contact our office at [phone] between the hours of 8:30 a.m. and 4:30 p.m.  Voicemails left after 4:30 p.m. will not be  returned until the following business day.  For prescription refill requests, have your pharmacy contact our office.         Resources For Cancer Patients and their Caregivers ? American Cancer Society: Can assist with transportation, wigs, general needs, runs Look Good Feel Better.        4406494594 ? Cancer Care: Provides financial assistance, online support groups, medication/co-pay assistance.  1-800-813-HOPE 785-613-0602) ? [facility] Assists [place] cancer patients and their families through emotional , educational and financial support.  808-844-4812 ? [place] DSS Where to apply for food stamps, Medicaid and utility assistance. 562-215-1958 ? [facility]: Transportation to medical appointments. 413-804-9574 ? Social Security Administration: May apply for disability if have a Stage IV cancer. 4370766841 262-512-4134 ? [employer], Disability and Transit Services: Assists with nutrition, care and transit needs. [place] Support Programs: '@10RELATIVEDAYS'$ @ > Cancer Support Group  2nd Tuesday of the month 1pm-2pm, Journey Room  > Creative Journey  3rd Tuesday of the month 1130am-1pm, Journey Room  > Look Good Feel Better  1st Wednesday of the month 10am-12 noon, Journey Room (Call [facility] to register [phone])

## 2016-05-30 NOTE — Progress Notes (Signed)
[place]  PROGRESS NOTE  Patient Care Team: Monico Blitz, MD as PCP - General (Internal Medicine)  CHIEF COMPLAINTS:  CT Cervical Spine 04/22/2016 showed a pancoast tumor in the right lung apex invading the mediastinum and the C7, T1 and T2 vertebra with extension into the neural foramina and into the spinal canal.  Consistent Right shoulder pain for the last 6 months    Pancoast tumor of right lung ([place])   04/29/2016 Imaging Pancoast tumor extending from medial RUL into R posterior superior mediastinum. abuts and may invade esoph and post trachea, abuts central R subclavian artery, erosion of the R anterior aspects of the C7, T1 and T2 vertebra   05/03/2016 Pathology Results Not diagnostic   05/03/2016 Procedure Video bronchoscopy, Transbronchial ultrasound guided biopsy of 4R LN, mediastinoscopy with direct biopsy of 2R LN   05/10/2016 PET scan intensely hypermetabolic RUL mass invading mediastinum c/w pancoast tumor. 2 LUL pulmonary nodules. Bilateral adrenal adenomas   05/17/2016 Procedure CT guided biopsy of right pancoast tumor by IR   05/20/2016 Pathology Results Lung, needle/core biopsy(ies), Right Upper Lobe SQUAMOUS CELL CARCINOMA   05/23/2016 -  Radiation Therapy XRT Dr. Tyler Pita    HISTORY OF PRESENTING ILLNESS:  George Pollard 82 y.o. male is here for ongoing follow-up of R pancoast tumor, squamous cell histology PET also suggested 2 LUL pulmonary nodules.  He is receiving XRT to the pancoast tumor and the 2 LUL lung nodules, he is receiving concurrent carbo/taxol.  George Pollard returns to the [facility]  He is in a wheelchair today and speaks in a whisper. He is accompanied today by his wife and daughter, who say that he uses a walker these days and has some trouble walking. George Pollard notes that, sometimes, if he turns around really fast, he gets dizzy. He adds it's "no more than I ever have been, really." He does confess that he's weak in his legs. His  wife notes "they get kind of bouncy." He says he can stand at a desk for 10-20 minutes, then when he turns around, his legs get a little weak, and he is wobbly.  He is able to get up out of his wheelchair and walk across the room after pushing himself up. He cannot stand without the assistance of his hands. He notes that, 3 months ago, his defibrillator was reset and his heart medication was changed. He notes that he thinks he remembers the new heart medication having a side-effect on walking. He also adds "when I first stand up, I've got to get my bearings." His wife comments that he can't lift his knees very well, indicating that his hip flexors may be a key weak point.  His wife confirms that he's drinking and eating. She says he's drinking a lot of water, ginger ale, sprite; he's eating "shakes," which they are mixing with Boost.  He notes today that his pain is probably at an average of 7, saying that it varies. He is not on neurontin. He notes an "immediate difference" when he takes a pain pill. He says he usually takes one when he wakes up, probably taking them three times a day and twice at night.  When asked about his bowels, he notes that he took Miralax and a suppository which, according to his wife and daughter, "caused a bomb."  He has not been on a regular bowel regimen. He notes that he understands he needs to do this.  No nausea, vomiting.  Constipation as detailed. Leg weakness as detailed. No headaches or blurry vision.   MEDICAL HISTORY:  Past Medical History  Diagnosis Date  . CAD (coronary artery disease)     a. CABG 1997. b. NSTEMI (troponin >20) s/p PTCA/DES to LCx 10/21/12, overlapping DES to mid & distal RCA 10/26/12; c. 02/2016 MV: EF 37%, large fixed defect - apex, inf, septal, lat wall, no ischemia.  . Atrial fibrillation ([place])   . Hypertension   . BPH (benign prostatic hyperplasia)   . PVD (peripheral vascular disease) ([place])     a. AAA repair 2001.  Marland Kitchen Systolic CHF  ([place])     a. 10/2012: acute on chronic systolic CHF / pulmonary edema secondary to severe mixed cardiomyopathy;  b. 02/2016 Echo: EF 30-35%, sev dil RV, RV dysfxn, massively dil RA, PASP 76mHg.  . Diabetes mellitus ([place]     a. Noted 10/2012 (prior hx of DM resolved with weight loss).  . Ventricular tachycardia ([place]     a. 02/2016 4 ICD shocks for VT-->Amio added.  .Marland KitchenAICD (automatic cardioverter/defibrillator) present   . Shortness of breath dyspnea   . Anxiety   . Lung cancer ([place]     pancoast tumor right lung apex invading mediastinum and C7, T1, T2 with extension into the neural foramina and into the spinal canal    SURGICAL HISTORY: Past Surgical History  Procedure Laterality Date  . Coronary artery bypass graft      1997  . Abdominal aortic aneurysm repair      08/26/2000  . Cataract extraction    . Tonsillectomy    . Left heart catheterization with coronary/graft angiogram N/A 10/21/2012    Procedure: LEFT HEART CATHETERIZATION WITH CBeatrix Fetters  Surgeon: CBurnell Blanks MD;  [facility];  Service: Cardiovascular;  Laterality: N/A;  . Percutaneous coronary stent intervention (pci-s) N/A 10/26/2012    Procedure: PERCUTANEOUS CORONARY STENT INTERVENTION (PCI-S);  Surgeon: MSherren Mocha MD;  [facility];  Service: Cardiovascular;  Laterality: N/A;  . Cardiac catheterization    . Mediastinoscopy N/A 05/03/2016    Procedure: MEDIASTINOSCOPY;  Surgeon: PIvin Poot MD;  [facility]  Service: Thoracic;  Laterality: N/A;  . Video bronchoscopy with endobronchial ultrasound N/A 05/03/2016    Procedure: VIDEO BRONCHOSCOPY WITH ENDOBRONCHIAL ULTRASOUND;  Surgeon: PIvin Poot MD;  [facility];  Service: Thoracic;  Laterality: N/A;    SOCIAL HISTORY: Social History   Social History  . Marital Status: Married    Spouse Name: N/A  . Number of Children: 4  . Years of Education: N/A   Occupational History  . Not on file.    Social History Main Topics  . Smoking status: Former Smoker -- 2.00 packs/day for 20 years    Types: Cigarettes    Quit date: 12/17/1995  . Smokeless tobacco: Never Used     Comment: Quit 1997  . Alcohol Use: No  . Drug Use: No  . Sexual Activity: No   Other Topics Concern  . Not on file   Social History Narrative   [place]  [place]ives [place] wife.     Married 673years 4 children 8 grandchildren 127great-grandchildren Ex smoker, quit in 1997. He was a heavy smoker, at least 2 ppd. Started at [age]yo. 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  He does carpentry as a hobby.  FAMILY HISTORY: Family History  Problem Relation Age of Onset  . Valvular heart disease Daughter     MVP repair   Father died at 88 yo. Received artifical knee replacement and fell after which broke it up. He had it replaced then fell again. At this point he just sat and gained weight. Alzheimer's. Had an aneurysm. Mother died at 71 yo. She had a heart attack in her 14s. Congestive heart failure. 2 brothers and 2 sisters total siblings. All 4 siblings have had aneurysms. 3 had surgeries, and 1 was too unhealthy for surgery. All were smokers.  2 brothers are still living.  ALLERGIES:  has No Known Allergies.  MEDICATIONS:  Current Outpatient Prescriptions  Medication Sig Dispense Refill  . amiodarone (PACERONE) 200 MG tablet Take 1 tablet by mouth 2 (two) times daily.  0  . aspirin EC 81 MG tablet Take 81 mg by mouth daily.    Marland Kitchen atorvastatin (LIPITOR) 80 MG tablet take 1 tablet by mouth once daily 30 tablet 2  . CARBOPLATIN IV Inject into the vein. To be given weekly with radiation    . dexamethasone (DECADRON) 2 MG tablet Take 1 tablet (2 mg total) by mouth 2 (two) times daily with a meal. 30 tablet 0  . fentaNYL (DURAGESIC - DOSED MCG/HR) 12 MCG/HR Place 1 patch (12.5 mcg total) onto the skin every 3 (three)  days. 10 patch 0  . fentaNYL (DURAGESIC - DOSED MCG/HR) 25 MCG/HR patch Place one patch on skin and change every 72 hours combine with 12 mcg patch for a total of 37 mcg 10 patch 0  . fentaNYL (DURAGESIC - DOSED MCG/HR) 50 MCG/HR Place 1 patch (50 mcg total) onto the skin every 3 (three) days. 10 patch 0  . furosemide (LASIX) 40 MG tablet Take 1 tablet (40 mg total) by mouth 2 (two) times daily. 180 tablet 3  . gabapentin (NEURONTIN) 300 MG capsule Take 1 capsule (300 mg total) by mouth at bedtime. 60 capsule 1  . Heparin Lock Flush (HEPARIN FLUSH, PORCINE,) 100 UNIT/ML injection Flush PICC line twice a week. First flush with Saline. Then Flush PICC line with 2.64m of Heparin. 16 Syringe 0  . HYDROcodone-acetaminophen (NORCO) 10-325 MG tablet Take 1 tablet by mouth every 4 (four) hours as needed. 90 tablet 0  . lisinopril (PRINIVIL,ZESTRIL) 2.5 MG tablet take 1 tablet by mouth once daily 30 tablet 2  . metFORMIN (GLUCOPHAGE) 500 MG tablet Take 500 mg by mouth daily.   0  . Metoprolol Tartrate (LOPRESSOR) 50 MG tablet Take 1.5 tablets (75 mg total) by mouth 2 (two) times daily. 90 tablet 6  . nitroGLYCERIN (NITROSTAT) 0.4 MG SL tablet Place 1 tablet (0.4 mg total) under the tongue every 5 (five) minutes as needed for chest pain (up to 3 doses). 25 tablet 2  . ondansetron (ZOFRAN) 8 MG tablet Take 1 tablet (8 mg total) by mouth every 8 (eight) hours as needed for nausea or vomiting. 30 tablet 2  . oxyCODONE (OXY IR/ROXICODONE) 5 MG immediate release tablet Take 1-2 tablets (5-10 mg total) by mouth every 6 (six) hours as needed for severe pain. 30 tablet 0  . PACLitaxel (TAXOL IV) Inject into the vein. To be given weekly with radiation    . potassium chloride SA (K-DUR,KLOR-CON) 20 MEQ tablet Take 1 tablet by mouth daily.  0  . prochlorperazine (COMPAZINE) 10 MG tablet Take 1 tablet (10 mg total) by mouth every 6 (six) hours as needed for  nausea or vomiting. 30 tablet 2  . Sodium Chloride Flush (NORMAL  SALINE FLUSH) 0.9 % SOLN Flush PICC line twice a week with 46m prior to flushing with Heparin. 16 Syringe 0  . warfarin (COUMADIN) 5 MG tablet Take 1 tablet (5 mg total) by mouth daily at 6 PM. 30 tablet 0   Current Facility-Administered Medications  Medication Dose Route Frequency Provider Last Rate Last Dose  . CARBOplatin (PARAPLATIN) 180 mg in sodium chloride 0.9 % 100 mL chemo infusion  180 mg Intravenous Once SPatrici Ranks MD      . PACLitaxel (TAXOL) 96 mg in dextrose 5 % 250 mL chemo infusion (</= '80mg'$ /m2)  45 mg/m2 (Treatment Plan Actual) Intravenous Once SPatrici Ranks MD       Facility-Administered Medications Ordered in Other Visits  Medication Dose Route Frequency Provider Last Rate Last Dose  . 0.9 %  sodium chloride infusion   Intravenous Once SPatrici Ranks MD      . dexamethasone (DECADRON) 20 mg in sodium chloride 0.9 % 50 mL IVPB  20 mg Intravenous Once SPatrici Ranks MD      . diphenhydrAMINE (BENADRYL) injection 50 mg  50 mg Intravenous Once SPatrici Ranks MD      . famotidine (PEPCID) IVPB 20 mg premix  20 mg Intravenous Once SPatrici Ranks MD      . heparin lock flush 100 unit/mL  500 Units Intracatheter Once PRN SPatrici Ranks MD      . palonosetron (ALOXI) injection 0.25 mg  0.25 mg Intravenous Once SPatrici Ranks MD      . sodium chloride flush (NS) 0.9 % injection 10 mL  10 mL Intracatheter PRN SPatrici Ranks MD        Review of Systems  Constitutional: Positive for weight loss (5 lbs weight loss since 6/2) and malaise/fatigue.       Decreased appetite  HENT: Positive for sore throat.   Eyes: Negative.   Respiratory: Negative.   Cardiovascular: Positive for leg swelling.       Occasional left leg swelling, managed with compression socks.  Gastrointestinal: Negative.   Genitourinary: Negative.   Musculoskeletal:       Consistent Right shoulder pain.  Skin: Negative.   Neurological: Positive for speech change, focal  weakness and weakness.       Right arm and bilateral leg weakness. Hoarse voice  Endo/Heme/Allergies: Negative.   Psychiatric/Behavioral: Negative.   All other systems reviewed and are negative.  14 point ROS was done and is otherwise as detailed above or in HPI    PHYSICAL EXAMINATION: ECOG PERFORMANCE STATUS: 2 - Symptomatic, <50% confined to bed  Filed Vitals:   05/30/16 0814  BP: 103/53  Pulse: 93  Temp: 98 F (36.7 C)  Resp: 20   Filed Weights   05/30/16 0814  Weight: 208 lb 1.6 oz (94.394 kg)    Physical Exam  Constitutional: He is oriented to person, place, and time and well-developed, well-nourished, and in no distress.  HENT:  Head: Normocephalic and atraumatic.  Mouth/Throat: Oropharynx is clear and moist.  Eyes: Conjunctivae and EOM are normal. Pupils are equal, round, and reactive to light. Right eye exhibits no discharge. Left eye exhibits no discharge. No scleral icterus.  Neck: Normal range of motion. Neck supple. No JVD present. No tracheal deviation present. No thyromegaly present.  Cardiovascular: Normal rate, regular rhythm and normal heart sounds.   Pulmonary/Chest: Effort normal and breath sounds normal. No  respiratory distress. He has no wheezes.  Abdominal: Soft. Bowel sounds are normal. He exhibits no distension and no mass. There is no tenderness. There is no rebound and no guarding.  Musculoskeletal: Normal range of motion. He exhibits no edema.  Compression hose LE bilaterally, muscle atrophy R scapular region  Lymphadenopathy:    He has no cervical adenopathy.  Neurological: He is alert and oriented to person, place, and time. No cranial nerve deficit.  Right UE weakness. Weak hip flexors. Cannot stand without pushing up on sides of chair with arms, difficulty lifting legs from hips  Skin: Skin is warm and dry. No erythema.  Psychiatric: Mood, memory, affect and judgment normal.  Nursing note and vitals reviewed.   LABORATORY DATA:  I have  reviewed the data as listed Lab Results  Component Value Date   WBC 7.3 05/30/2016   HGB 11.2* 05/30/2016   HCT 33.7* 05/30/2016   MCV 90.3 05/30/2016   PLT 250 05/30/2016   CMP     Component Value Date/Time   NA 135 05/30/2016 0815   K 3.9 05/30/2016 0815   CL 97* 05/30/2016 0815   CO2 30 05/30/2016 0815   GLUCOSE 234* 05/30/2016 0815   BUN 29* 05/30/2016 0815   CREATININE 1.20 05/30/2016 0815   CALCIUM 9.1 05/30/2016 0815   PROT 6.3* 05/30/2016 0815   ALBUMIN 2.8* 05/30/2016 0815   AST 27 05/30/2016 0815   ALT 35 05/30/2016 0815   ALKPHOS 90 05/30/2016 0815   BILITOT 1.3* 05/30/2016 0815   GFRNONAA 55* 05/30/2016 0815   GFRAA >60 05/30/2016 0815     RADIOGRAPHIC STUDIES: I have personally reviewed the radiological images as listed and agreed with the findings in the report. No results found.  Study Result     CLINICAL DATA: Initial treatment strategy for RIGHT upper lobe and the mediastinal mass.  EXAM: NUCLEAR MEDICINE PET SKULL BASE TO THIGH  TECHNIQUE: 9.5 mCi F-18 FDG was injected intravenously. Full-ring PET imaging was performed from the skull base to thigh after the radiotracer. CT data was obtained and used for attenuation correction and anatomic localization.  FASTING BLOOD GLUCOSE: Value: 211 mg/dl  COMPARISON: CT thorax 04/29/2016  FINDINGS: NECK  No hypermetabolic lymph nodes in the neck.  CHEST  RIGHT upper lobe mass invading the mediastinum is intensely metabolic with SUV max equals 16.4. The metabolic activity is relatively homogeneous throughout the mass lesion. The lesion measures 4.8 by a 4.1 cm and axial dimension. The mass deviates the trachea and esophagus leftward.  There are no hypermetabolic mediastinal lymph nodes.  Lobular nodule in the LEFT upper lobe measuring 10 mm (image 76, series 4) with SUV max equal 5.8. Ground-glass nodule in the LEFT upper lobe measuring 15 mm also has associated metabolic  activity with SUV max of 2.7 which is high for the low degree of cellularity of this lesion.  Of note, there is a well-circumscribed new lesion in the RIGHT lower paratracheal station measuring 3.5 x 2.4 cm with thick rim and internal gas and semi-solid material (image 67, series 4). This presumably represents a post procedural collection following a bronchoscopy and lymph node sampling. There is no significant metabolic activity associated with this lesion which would indicate that lesion is not superinfected.  ABDOMEN/PELVIS  There is thickening of LEFT and RIGHT adrenal gland. Adrenal glands have low-attenuation consists with adenomas. Neither of these enlarged gland significant metabolic activity above background liver activity. The RIGHT gland does have a focus nodularity with higher  density.  No abnormal metabolic activity liver. No hypermetabolic abdominal pelvic lymph nodes. Atherosclerotic calcification aorta.  SKELETON  No focal hypermetabolic activity to suggest skeletal metastasis.  IMPRESSION: 1. Intensely hypermetabolic RIGHT upper lobe mass invading the mediastinum consistent with malignant Pancoast tumor. Metabolic activity is uniform throughout the lesion. 2. No evidence of metastatic adenopathy in the mediastinum. 3. Two LEFT upper lobe pulmonary nodules are concerning for metastatic disease. 4. New gas and semi-solid tissue collection in the RIGHT lower paratracheal location is presumed sequelae of the bronchoscopy and lymph node sampling. This collection is rather large at 3.5 cm but no significant metabolic activity which suggests a sterile collection. No pneumothorax. 5. Bilateral adrenal adenomas. These results will be called to the ordering clinician or representative by the Radiologist Assistant, and communication documented in the PACS or zVision Dashboard.   Electronically Signed  By: Suzy Bouchard M.D.  On: 05/10/2016 14:50    PATHOLOGY   ASSESSMENT & PLAN:  R Pancoast Tumor CT Cervical Spine 04/22/2016 showed a pancoast tumor in the right lung apex invading the mediastinum and the C7, T1 and T2 vertebra with extension into the neural foramina and into the spinal canal.  Consistent Right shoulder pain for the last 6 months R arm/hand weakness History of tobacco use Hip flexor weakness Cancer related pain Anemia  He will continue with cycle #2 of weekly carboplatin/taxol. He will continue with XRT. I have increased his duragesic to 50 mcg given that he is still using 5 breakthrough pills significant pain.  I have also added neurontin at night.   We readdressed adequate management of his constipation. I advised the patient and his wife to read through our constipation sheet, he needs a daily regimen. Either Miralax daily, MOM daily. He could also use a stimulant laxative if needed.   I have referred home health for PT and strengthening.   He will return in one week for ongoing therapy and reassessment.   All questions were answered. The patient knows to call the clinic with any problems, questions or concerns.  This document serves as a record of services personally performed by Ancil Linsey, MD. It was created on her behalf by Toni Amend, a trained medical scribe. The creation of this record is based on the scribe's personal observations and the provider's statements to them. This document has been checked and approved by the attending provider.  I have reviewed the above documentation for accuracy and completeness, and I agree with the above.  This note was electronically signed.    Molli Hazard, MD  05/30/2016 9:12 AM

## 2016-05-30 NOTE — Progress Notes (Signed)
George Pollard Tolerated chemotherapy well today Discharged via wheelchair  George Pollard presented for PICC line dressing change.  See IV assessment in docflowsheets for PICC details.  Proper placement of PICC confirmed by CXR.  PICC located right arm.  Good blood return present. PICC flushed with 24m NS and 250U Heparin, see MAR for further details.  [name] tolerated procedure well and without incident.

## 2016-05-30 NOTE — Patient Instructions (Signed)
[place] at [facility] Discharge Instructions  RECOMMENDATIONS MADE BY THE CONSULTANT AND ANY TEST RESULTS WILL BE SENT TO YOUR REFERRING PHYSICIAN.   Chemotherapy today Flush on Monday, we flush your port on thursdays Dressing change today Chemotherapy next week Follow up as scheduled Please call the clinic if you have any questions or concerns    Thank you for choosing [place] at [facility] to provide your oncology and hematology care.  To afford each patient quality time with our provider, please arrive at least 15 minutes before your scheduled appointment time.   Beginning January 23rd 2017 lab work for the [facility] will be done in the  Main lab at [facility] on 1st floor. If you have a lab appointment with the [facility] please come in thru the  Main Entrance and check in at the main information desk  You need to re-schedule your appointment should you arrive 10 or more minutes late.  We strive to give you quality time with our providers, and arriving late affects you and other patients whose appointments are after yours.  Also, if you no show three or more times for appointments you may be dismissed from the clinic at the providers discretion.     Again, thank you for choosing [facility].  Our hope is that these requests will decrease the amount of time that you wait before being seen by our physicians.       _____________________________________________________________  Should you have questions after your visit to [facility], please contact our office at [phone] between the hours of 8:30 a.m. and 4:30 p.m.  Voicemails left after 4:30 p.m. will not be returned until the following business day.  For prescription refill requests, have your pharmacy contact our office.         Resources For Cancer Patients and their Caregivers ? American Cancer Society: Can assist with  transportation, wigs, general needs, runs Look Good Feel Better.        301-367-6025 ? Cancer Care: Provides financial assistance, online support groups, medication/co-pay assistance.  1-800-813-HOPE (416)259-4373) ? [place] Assists [place] cancer patients and their families through emotional , educational and financial support.  7797909335 ? [place] DSS Where to apply for food stamps, Medicaid and utility assistance. 269-331-8260 ? RCATS: Transportation to medical appointments. (949) 401-2538 ? Social Security Administration: May apply for disability if have a Stage IV cancer. 315-731-5056 [phone])054-3340 ? [employer], Disability and Transit Services: Assists with nutrition, care and transit needs. [place] Support Programs: '@10RELATIVEDAYS'$ @ > Cancer Support Group  2nd Tuesday of the month 1pm-2pm, Journey Room  > Creative Journey  3rd Tuesday of the month 1130am-1pm, Journey Room  > Look Good Feel Better  1st Wednesday of the month 10am-12 noon, Journey Room (Call [place] to register [phone])

## 2016-05-31 ENCOUNTER — Encounter: Payer: Self-pay | Admitting: Radiation Oncology

## 2016-05-31 ENCOUNTER — Ambulatory Visit
Admission: RE | Admit: 2016-05-31 | Discharge: 2016-05-31 | Disposition: A | Discharge status: Home / Self Care | Payer: Medicare Other | Source: Ambulatory Visit | Attending: Radiation Oncology | Admitting: Radiation Oncology

## 2016-05-31 VITALS — BP 95/72 | HR 85 | Resp 18 | Wt 210.9 lb

## 2016-05-31 DIAGNOSIS — Z510 Encounter for antineoplastic radiation therapy: Secondary | ICD-10-CM | POA: Diagnosis not present

## 2016-05-31 DIAGNOSIS — C3411 Malignant neoplasm of upper lobe, right bronchus or lung: Secondary | ICD-10-CM

## 2016-05-31 NOTE — Progress Notes (Signed)
Department of Radiation Oncology  Phone:  [phone] Fax:        [phone]  Weekly Treatment Note    Name: George Pollard Date: 06/01/2016 MRN: [id number] DOB: July 24, 1933   Diagnosis:     ICD-9-CM ICD-10-CM   1. 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 amiodarone (PACERONE) 200 MG tablet Take 1 tablet by mouth 2 (two) times daily.  0  . aspirin EC 81 MG tablet Take 81 mg by mouth daily.    Marland Kitchen atorvastatin (LIPITOR) 80 MG tablet take 1 tablet by mouth once daily 30 tablet 2  . CARBOPLATIN IV Inject into the vein. To be given weekly with radiation    . dexamethasone (DECADRON) 2 MG tablet Take 1 tablet (2 mg total) by mouth 2 (two) times daily with a meal. 30 tablet 0  . fentaNYL (DURAGESIC - DOSED MCG/HR) 50 MCG/HR Place 1 patch (50 mcg total) onto the skin every 3 (three) days. 10 patch 0  . furosemide (LASIX) 40 MG tablet Take 1 tablet (40 mg total) by mouth 2 (two) times daily. 180 tablet 3  . gabapentin (NEURONTIN) 300 MG capsule Take 1 capsule (300 mg total) by mouth at bedtime. 60 capsule 1  . Heparin Lock Flush (HEPARIN FLUSH, PORCINE,) 100 UNIT/ML injection Flush PICC line twice a week. First flush with Saline. Then Flush PICC line with 2.28m of Heparin. 16 Syringe 0  . lisinopril (PRINIVIL,ZESTRIL) 2.5 MG tablet take 1 tablet by mouth once daily 30 tablet 2  . metFORMIN (GLUCOPHAGE) 500 MG tablet Take 500 mg by mouth daily.   0  . Metoprolol Tartrate (LOPRESSOR) 50 MG tablet Take 1.5 tablets (75 mg total) by mouth 2 (two) times daily. 90 tablet 6  . nitroGLYCERIN (NITROSTAT) 0.4 MG SL tablet Place 1 tablet (0.4 mg total) under the tongue every 5 (five) minutes as needed for chest pain (up to 3 doses). 25 tablet 2  . ondansetron (ZOFRAN) 8 MG tablet Take 1 tablet (8 mg total) by mouth every 8 (eight) hours as needed for nausea  or vomiting. 30 tablet 2  . PACLitaxel (TAXOL IV) Inject into the vein. To be given weekly with radiation    . potassium chloride SA (K-DUR,KLOR-CON) 20 MEQ tablet Take 1 tablet by mouth daily.  0  . prochlorperazine (COMPAZINE) 10 MG tablet Take 1 tablet (10 mg total) by mouth every 6 (six) hours as needed for nausea or vomiting. 30 tablet 2  . Sodium Chloride Flush (NORMAL SALINE FLUSH) 0.9 % SOLN Flush PICC line twice a week with 165mprior to flushing with Heparin. 16 Syringe 0  . warfarin (COUMADIN) 5 MG tablet Take 1 tablet (5 mg total) by mouth daily at 6 PM. 30 tablet 0  . fentaNYL (DURAGESIC - DOSED MCG/HR) 12 MCG/HR Place 1 patch (12.5 mcg total) onto the skin every 3 (three) days. (Patient not taking: Reported on 05/31/2016) 10 patch 0  . fentaNYL (DURAGESIC - DOSED MCG/HR) 25 MCG/HR patch Place one patch on skin and change every 72 hours combine with 12 mcg patch for a total of 37 mcg (Patient not taking: Reported on 05/31/2016) 10 patch 0  . HYDROcodone-acetaminophen (NORCO) 10-325 MG tablet Take 1 tablet by mouth every 4 (four) hours as needed. (Patient not taking: Reported on 05/31/2016) 90 tablet 0  . oxyCODONE (OXY  IR/ROXICODONE) 5 MG immediate release tablet Take 1-2 tablets (5-10 mg total) by mouth every 6 (six) hours as needed for severe pain. (Patient not taking: Reported on 05/31/2016) 30 tablet 0   No current facility-administered medications for this encounter.     ALLERGIES: Review of patient's allergies indicates no known allergies.   LABORATORY DATA:  Lab Results  Component Value Date   WBC 7.3 05/30/2016   HGB 11.2* 05/30/2016   HCT 33.7* 05/30/2016   MCV 90.3 05/30/2016   PLT 250 05/30/2016   Lab Results  Component Value Date   NA 135 05/30/2016   K 3.9 05/30/2016   CL 97* 05/30/2016   CO2 30 05/30/2016   Lab Results  Component Value Date   ALT 35 05/30/2016   AST 27 05/30/2016   ALKPHOS 90 05/30/2016   BILITOT 1.3* 05/30/2016     NARRATIVE: George Pollard was seen today for weekly treatment management. The chart was checked and the patient's films were reviewed.  BP low. Reports right shoulder pain is better controlled with Fentanyl 50 and neurontin prescribed recently by Dr. Whitney Muse. Reports an occasionally dry cough. Reports occasional discomfort with swallowing, but no pain. Denies skin changes within the treatment field, but does report using radiaplex regularly. Reports SOB with exertion. Reports overall that he feels like he is breathing better. Reports fatigue.   PHYSICAL EXAMINATION: weight is 210 lb 14.4 oz (95.664 kg). His blood pressure is 95/72 and his pulse is 85. His respiration is 18 and oxygen saturation is 92%.   Presents to clinic in a wheelchair. Hoarse voice noted. Bilateral lower extremity edema.  ASSESSMENT: The patient is doing satisfactorily with treatment.  PLAN: We will continue with the patient's radiation treatment as planned.  ------------------------------------------------  Jodelle Gross, MD, PhD  This document serves as a record of services personally performed by Kyung Rudd, MD. It was created on his behalf by Darcus Austin, a trained medical scribe. The creation of this record is based on the scribe's personal observations and the provider's statements to them. This document has been checked and approved by the attending provider.

## 2016-05-31 NOTE — Progress Notes (Signed)
Weight stable. BP low. Reports right shoulder pain is better controlled with Fentanyl 50 and neurontin prescribed recently by [place]. Reports an occasionally dry cough. Reports occasional discomfort with swallowing but, no pain. Denies skin changes within treatment field but, does report using radiaplex regularly. Reports SOB with exertion. Reports overall he feels like he is breathing better. Reports fatigue.   BP 95/72 mmHg  Pulse 85  Resp 18  Wt 210 lb 14.4 oz (95.664 kg)  SpO2 92% Wt Readings from Last 3 Encounters:  05/31/16 210 lb 14.4 oz (95.664 kg)  05/30/16 208 lb 1.6 oz (94.394 kg)  05/24/16 210 lb 4.8 oz (95.391 kg)

## 2016-06-03 ENCOUNTER — Ambulatory Visit
Admission: RE | Admit: 2016-06-03 | Discharge: 2016-06-03 | Disposition: A | Discharge status: Home / Self Care | Payer: Medicare Other | Source: Ambulatory Visit | Attending: Radiation Oncology | Admitting: Radiation Oncology

## 2016-06-03 DIAGNOSIS — Z510 Encounter for antineoplastic radiation therapy: Secondary | ICD-10-CM | POA: Diagnosis not present

## 2016-06-04 ENCOUNTER — Ambulatory Visit
Admission: RE | Admit: 2016-06-04 | Discharge: 2016-06-04 | Disposition: A | Discharge status: Home / Self Care | Payer: Medicare Other | Source: Ambulatory Visit | Attending: Radiation Oncology | Admitting: Radiation Oncology

## 2016-06-05 ENCOUNTER — Ambulatory Visit
Admission: RE | Admit: 2016-06-05 | Discharge: 2016-06-05 | Disposition: A | Discharge status: Home / Self Care | Payer: Medicare Other | Source: Ambulatory Visit | Attending: Radiation Oncology | Admitting: Radiation Oncology

## 2016-06-05 DIAGNOSIS — Z510 Encounter for antineoplastic radiation therapy: Secondary | ICD-10-CM | POA: Diagnosis not present

## 2016-06-06 ENCOUNTER — Encounter (HOSPITAL_BASED_OUTPATIENT_CLINIC_OR_DEPARTMENT_OTHER): Payer: Medicare Other | Admitting: Oncology

## 2016-06-06 ENCOUNTER — Encounter (HOSPITAL_COMMUNITY): Payer: Self-pay

## 2016-06-06 ENCOUNTER — Encounter (HOSPITAL_BASED_OUTPATIENT_CLINIC_OR_DEPARTMENT_OTHER): Payer: Medicare Other

## 2016-06-06 ENCOUNTER — Ambulatory Visit
Admission: RE | Admit: 2016-06-06 | Discharge: 2016-06-06 | Disposition: A | Discharge status: Home / Self Care | Payer: Medicare Other | Source: Ambulatory Visit | Attending: Radiation Oncology | Admitting: Radiation Oncology

## 2016-06-06 ENCOUNTER — Encounter (HOSPITAL_COMMUNITY): Payer: Self-pay | Admitting: Oncology

## 2016-06-06 VITALS — BP 120/73 | HR 86 | Temp 98.0°F | Resp 18 | Wt 214.6 lb

## 2016-06-06 DIAGNOSIS — R600 Localized edema: Secondary | ICD-10-CM | POA: Diagnosis not present

## 2016-06-06 DIAGNOSIS — Z5111 Encounter for antineoplastic chemotherapy: Secondary | ICD-10-CM | POA: Diagnosis not present

## 2016-06-06 DIAGNOSIS — C3411 Malignant neoplasm of upper lobe, right bronchus or lung: Secondary | ICD-10-CM

## 2016-06-06 DIAGNOSIS — Z510 Encounter for antineoplastic radiation therapy: Secondary | ICD-10-CM | POA: Diagnosis not present

## 2016-06-06 DIAGNOSIS — R918 Other nonspecific abnormal finding of lung field: Secondary | ICD-10-CM

## 2016-06-06 LAB — CBC WITH DIFFERENTIAL/PLATELET
BASOS ABS: 0 10*3/uL (ref 0.0–0.1)
BASOS PCT: 0 %
Eosinophils Absolute: 0 10*3/uL (ref 0.0–0.7)
Eosinophils Relative: 0 %
HEMATOCRIT: 30.9 % — AB (ref 39.0–52.0)
HEMOGLOBIN: 10.1 g/dL — AB (ref 13.0–17.0)
LYMPHS PCT: 16 %
Lymphs Abs: 0.8 10*3/uL (ref 0.7–4.0)
MCH: 29.4 pg (ref 26.0–34.0)
MCHC: 32.7 g/dL (ref 30.0–36.0)
MCV: 90.1 fL (ref 78.0–100.0)
Monocytes Absolute: 0.3 10*3/uL (ref 0.1–1.0)
Monocytes Relative: 6 %
NEUTROS ABS: 3.8 10*3/uL (ref 1.7–7.7)
NEUTROS PCT: 78 %
Platelets: 244 10*3/uL (ref 150–400)
RBC: 3.43 MIL/uL — AB (ref 4.22–5.81)
RDW: 17 % — ABNORMAL HIGH (ref 11.5–15.5)
WBC: 4.9 10*3/uL (ref 4.0–10.5)

## 2016-06-06 LAB — COMPREHENSIVE METABOLIC PANEL
ALBUMIN: 2.7 g/dL — AB (ref 3.5–5.0)
ALT: 39 U/L (ref 17–63)
AST: 30 U/L (ref 15–41)
Alkaline Phosphatase: 72 U/L (ref 38–126)
Anion gap: 8 (ref 5–15)
BILIRUBIN TOTAL: 1.5 mg/dL — AB (ref 0.3–1.2)
BUN: 23 mg/dL — ABNORMAL HIGH (ref 6–20)
CHLORIDE: 98 mmol/L — AB (ref 101–111)
CO2: 28 mmol/L (ref 22–32)
CREATININE: 1.1 mg/dL (ref 0.61–1.24)
Calcium: 8.6 mg/dL — ABNORMAL LOW (ref 8.9–10.3)
GFR calc Af Amer: >60 mL/min (ref >60)
Glucose, Bld: 218 mg/dL — ABNORMAL HIGH (ref 65–99)
POTASSIUM: 3.7 mmol/L (ref 3.5–5.1)
Sodium: 134 mmol/L — ABNORMAL LOW (ref 135–145)
Total Protein: 6.1 g/dL — ABNORMAL LOW (ref 6.5–8.1)

## 2016-06-06 MED ORDER — HEPARIN SOD (PORK) LOCK FLUSH 100 UNIT/ML IV SOLN
500.0000 [IU] | Freq: Once | INTRAVENOUS | Status: AC | PRN
  Administered 2016-06-06: 250 [IU]
  Filled 2016-06-06: qty 5

## 2016-06-06 MED ORDER — PACLITAXEL CHEMO INJECTION 300 MG/50ML
45.0000 mg/m2 | Freq: Once | INTRAVENOUS | Status: AC
  Administered 2016-06-06: 96 mg via INTRAVENOUS
  Filled 2016-06-06: qty 16

## 2016-06-06 MED ORDER — DIPHENHYDRAMINE HCL 50 MG/ML IJ SOLN
50.0000 mg | Freq: Once | INTRAMUSCULAR | Status: AC
  Administered 2016-06-06: 50 mg via INTRAVENOUS
  Filled 2016-06-06: qty 1

## 2016-06-06 MED ORDER — SODIUM CHLORIDE 0.9 % IV SOLN
Freq: Once | INTRAVENOUS | Status: AC
  Administered 2016-06-06: 10:00:00 via INTRAVENOUS

## 2016-06-06 MED ORDER — SODIUM CHLORIDE 0.9 % IV SOLN
195.8000 mg | Freq: Once | INTRAVENOUS | Status: AC
  Administered 2016-06-06: 200 mg via INTRAVENOUS
  Filled 2016-06-06: qty 20

## 2016-06-06 MED ORDER — HYDROCODONE-ACETAMINOPHEN 10-325 MG PO TABS
1.0000 | ORAL_TABLET | ORAL | Status: DC | PRN
Start: 2016-06-06 — End: 2016-07-09

## 2016-06-06 MED ORDER — PALONOSETRON HCL INJECTION 0.25 MG/5ML
0.2500 mg | Freq: Once | INTRAVENOUS | Status: AC
  Administered 2016-06-06: 0.25 mg via INTRAVENOUS
  Filled 2016-06-06: qty 5

## 2016-06-06 MED ORDER — SODIUM CHLORIDE 0.9% FLUSH
10.0000 mL | INTRAVENOUS | Status: DC | PRN
  Administered 2016-06-06: 10 mL
  Filled 2016-06-06: qty 10

## 2016-06-06 MED ORDER — SODIUM CHLORIDE 0.9 % IV SOLN
20.0000 mg | Freq: Once | INTRAVENOUS | Status: AC
  Administered 2016-06-06: 20 mg via INTRAVENOUS
  Filled 2016-06-06: qty 2

## 2016-06-06 MED ORDER — FAMOTIDINE IN NACL 20-0.9 MG/50ML-% IV SOLN
20.0000 mg | Freq: Once | INTRAVENOUS | Status: AC
  Administered 2016-06-06: 20 mg via INTRAVENOUS
  Filled 2016-06-06: qty 50

## 2016-06-06 NOTE — Assessment & Plan Note (Addendum)
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  Currently undergoing concomitant chemoXRT consisting of weekly carboplatin/paclitaxel beginning on 05/23/2016.  Oncology history is updated.  Pre-treatment labs today: CBC diff, CMET.  I personally reviewed and went over laboratory results with the patient.  The results are noted within this dictation.  His labs satisfy treatment parameters today and therefore we will pursue chemotherapy as planned today.  His progressive anemia is noted at 10.1 g/dL and his mild hyperbilirubinemia is appreciated.  No dose changes are needed at this time.  Will need to monitor moving forward.  Last week, Dr. Whitney Muse increased the patient's Fentanyl patch to 50 mcg.  He notes that his pain may be a little improved, but he continues with 5 breakthrough pills in 24 hour period.  He does note increased drowsiness (which he described as "tiredness) since increase in Fentanyl, but this does not interfer with his ADL/IADL.  Therefore, I will not alter his long-acting pain medication at this time, but this may be considered in the future once he is more acclimated to the increase dose of Fentanyl.  "The pain is manageable." I have refilled his Hydrocodone.  He is on a QOD bowel regimen consisting of a 1/2 cap of MiraLax.  This is effective for him.   He notes increased LE edema.  According to the patient's family, it has progressed and is now proximal to his knee and is palpable on exam in his thigh.  He denies a change in his breathing.  He denies any chest pain.  He notes that the edema is tender and does not improve much in the AM.  It does worsen throughout the day.  He is wearing mid-tibial high compression stockings.  He is on a relatively high dose of Lasix by his cardiologist.  I will message Dr. Lovena Le about this as Mr. Perris may be demonstrating signs of extravascular fluid overload.  Fortunately, his  breathing is near baseline.  He will return next week for follow-up and next cycle of chemotherapy.

## 2016-06-06 NOTE — Patient Instructions (Signed)
[place] at [facility] Discharge Instructions  RECOMMENDATIONS MADE BY THE CONSULTANT AND ANY TEST RESULTS WILL BE SENT TO YOUR REFERRING PHYSICIAN.  You were seen by Darcella Gasman, PA today. Return to the clinic as scheduled for next appointment.  Recommend keeping your lower extremities elevated.   You will complete your treatment today as scheduled.   He has provided a refill prescription for your pain medication.   Call the clinic with any questions or concerns.   Thank you for choosing [place] at [facility] to provide your oncology and hematology care.  To afford each patient quality time with our provider, please arrive at least 15 minutes before your scheduled appointment time.   Beginning January 23rd 2017 lab work for the [facility] will be done in the  Main lab at [facility] on 1st floor. If you have a lab appointment with the [facility] please come in thru the  Main Entrance and check in at the main information desk  You need to re-schedule your appointment should you arrive 10 or more minutes late.  We strive to give you quality time with our providers, and arriving late affects you and other patients whose appointments are after yours.  Also, if you no show three or more times for appointments you may be dismissed from the clinic at the providers discretion.     Again, thank you for choosing [facility].  Our hope is that these requests will decrease the amount of time that you wait before being seen by our physicians.       _____________________________________________________________  Should you have questions after your visit to [facility], please contact our office at [phone] between the hours of 8:30 a.m. and 4:30 p.m.  Voicemails left after 4:30 p.m. will not be returned until the following business day.  For prescription refill requests, have your pharmacy contact our office.          Resources For Cancer Patients and their Caregivers ? American Cancer Society: Can assist with transportation, wigs, general needs, runs Look Good Feel Better.        603-373-4103 ? Cancer Care: Provides financial assistance, online support groups, medication/co-pay assistance.  1-800-813-HOPE 619-712-4358) ? [place] Assists [place] cancer patients and their families through emotional , educational and financial support.  432-118-7642 ? [place] DSS Where to apply for food stamps, Medicaid and utility assistance. 331-407-3111 ? RCATS: Transportation to medical appointments. 832-676-9482 ? Social Security Administration: May apply for disability if have a Stage IV cancer. [phone] 401-448-7087 ? [employer], Disability and Transit Services: Assists with nutrition, care and transit needs. [place] Support Programs: '@10RELATIVEDAYS'$ @ > Cancer Support Group  2nd Tuesday of the month 1pm-2pm, Journey Room  > Creative Journey  3rd Tuesday of the month 1130am-1pm, Journey Room  > Look Good Feel Better  1st Wednesday of the month 10am-12 noon, Journey Room (Call [place] to register [phone])

## 2016-06-06 NOTE — Patient Instructions (Signed)
[facility] Discharge Instructions for Patients Receiving Chemotherapy   Beginning January 23rd 2017 lab work for the Cancer Center will be done in the  Main lab at [place] on 1st floor. If you have a lab appointment with the Cancer Center please come in thru the  Main Entrance and check in at the main information desk   Today you received the following chemotherapy agents:  Taxol and carboplatin  If you develop nausea and vomiting, or diarrhea that is not controlled by your medication, call the clinic.  The clinic phone number is [phone]. Office hours are Monday-Friday 8:30am-5:00pm.  BELOW ARE SYMPTOMS THAT SHOULD BE REPORTED IMMEDIATELY:  *FEVER GREATER THAN 101.0 F  *CHILLS WITH OR WITHOUT FEVER  NAUSEA AND VOMITING THAT IS NOT CONTROLLED WITH YOUR NAUSEA MEDICATION  *UNUSUAL SHORTNESS OF BREATH  *UNUSUAL BRUISING OR BLEEDING  TENDERNESS IN MOUTH AND THROAT WITH OR WITHOUT PRESENCE OF ULCERS  *URINARY PROBLEMS  *BOWEL PROBLEMS  UNUSUAL RASH Items with * indicate a potential emergency and should be followed up as soon as possible. If you have an emergency after office hours please contact your primary care physician or go to the nearest emergency department.  Please call the clinic during office hours if you have any questions or concerns.   You may also contact the Patient Navigator at [phone] should you have any questions or need assistance in obtaining follow up care.      Resources For Cancer Patients and their Caregivers ? American Cancer Society: Can assist with transportation, wigs, general needs, runs Look Good Feel Better.        1-888-227-6333 ? Cancer Care: Provides financial assistance, online support groups, medication/co-pay assistance.  1-800-813-HOPE (4673) ? [facility] Assists [place] cancer patients and their families through emotional , educational and financial support.   336-427-4357 ? [place] DSS Where to apply for food stamps, Medicaid and utility assistance. 336-342-1394 ? RCATS: Transportation to medical appointments. 336-347-2287 ? Social Security Administration: May apply for disability if have a Stage IV cancer. 336-342-7796 1-800-772-1213 ? [place] A[employer], Disability and Transit Services: Assists with nutrition, care and transit needs. [phone]         

## 2016-06-06 NOTE — Progress Notes (Signed)
[facility], [place] [id number]  Pancoast tumor of right lung [facility])  Lung mass - Plan: HYDROcodone-acetaminophen (NORCO) 10-325 MG tablet  CURRENT THERAPY: Concurrent chemoXRT consisting of weekly carboplatin/paclitaxel beginning on 05/23/2016.  XRT is targeted at pancoast tumor and two LUL pulmonary nodules.  INTERVAL HISTORY: George Pollard 82 y.o. male returns for followup of right pancoast tumor, squamous cell histology with PET imaging on 05/10/2016 suggestive of two LUL pulmonary nodules.    Pancoast tumor of right lung ([place])   04/29/2016 Imaging Pancoast tumor extending from medial RUL into R posterior superior mediastinum. abuts and may invade esoph and post trachea, abuts central R subclavian artery, erosion of the R anterior aspects of the C7, T1 and T2 vertebra   05/03/2016 Pathology Results Not diagnostic   05/03/2016 Procedure Video bronchoscopy, Transbronchial ultrasound guided biopsy of 4R LN, mediastinoscopy with direct biopsy of 2R LN by Dr. Prescott Gum.   05/10/2016 PET scan intensely hypermetabolic RUL mass invading mediastinum c/w pancoast tumor. 2 LUL pulmonary nodules. Bilateral adrenal adenomas   05/17/2016 Procedure CT guided biopsy of right pancoast tumor by IR   05/20/2016 Pathology Results Lung, needle/core biopsy(ies), Right Upper Lobe SQUAMOUS CELL CARCINOMA   05/23/2016 -  Radiation Therapy XRT Dr. Tyler Pita.  Targeted at pancoast tumor and two LUL pulmonary nodules.   05/23/2016 -  Chemotherapy Weekly Carboplatin/Paclitaxel   He is tolerating chemotherapy at this point in time. He denies any nausea or vomiting.  Pain continues to be an issue. Since his increase in that now last week, he reports that his pain is minimally improved. "It is manageable." He continues with approximate 5-6 hydrocodone short acting tablets throughout the day. He notes increased tiredness since the increase in fentanyl.  He is on bowel regimen that effective for  him. He is taking half of a capful of MiraLAX every other day.  He is concerned about his lower extremity edema. The patient's family interjects that he has on 120 mg of Lasix daily by his cardiologist. He is utilizing compression stockings that are in place today. He has significant edema of his lower extremities moving proximally up to his mid thigh.  He notes that it is worse than it has been in the past. He denies any significant changes in his breathing. He denies any chest pain.  Review of Systems  Constitutional: Negative for fever, chills and weight loss.  HENT: Negative.   Eyes: Negative.   Respiratory: Positive for shortness of breath (at baseline). Negative for cough.   Cardiovascular: Positive for leg swelling (2-3+ pitting in lower extremities bilaterally). Negative for chest pain and claudication.  Gastrointestinal: Negative for nausea, vomiting, diarrhea and constipation.  Genitourinary: Negative.   Musculoskeletal: Negative.   Skin: Negative.   Neurological: Negative.   Endo/Heme/Allergies: Negative.   Psychiatric/Behavioral: Negative.     Past Medical History  Diagnosis Date  . CAD (coronary artery disease)     a. CABG 1997. b. NSTEMI (troponin >20) s/p PTCA/DES to LCx 10/21/12, overlapping DES to mid & distal RCA 10/26/12; c. 02/2016 MV: EF 37%, large fixed defect - apex, inf, septal, lat wall, no ischemia.  . Atrial fibrillation ([place])   . Hypertension   . BPH (benign prostatic hyperplasia)   . PVD (peripheral vascular disease) ([place])     a. AAA repair 2001.  Marland Kitchen Systolic CHF ([place])     a. 10/2012: acute on chronic systolic CHF / pulmonary edema secondary to  severe mixed cardiomyopathy;  b. 02/2016 Echo: EF 30-35%, sev dil RV, RV dysfxn, massively dil RA, PASP 26mHg.  . Diabetes mellitus ([place]     a. Noted 10/2012 (prior hx of DM resolved with weight loss).  . Ventricular tachycardia ([place]     a. 02/2016 4 ICD shocks for VT-->Amio added.  .Marland KitchenAICD (automatic  cardioverter/defibrillator) present   . Shortness of breath dyspnea   . Anxiety   . Lung cancer ([place]     pancoast tumor right lung apex invading mediastinum and C7, T1, T2 with extension into the neural foramina and into the spinal canal  . [place]ast tumor of right lung ([place] 05/17/2016    Past Surgical History  Procedure Laterality Date  . Coronary artery bypass graft      1997  . Abdominal aortic aneurysm repair      08/26/2000  . Cataract extraction    . Tonsillectomy    . Left heart catheterization with coronary/graft angiogram N/A 10/21/2012    Procedure: LEFT HEART CATHETERIZATION WITH CBeatrix Fetters  Surgeon: CBurnell Blanks MD;  [facility];  Service: Cardiovascular;  Laterality: N/A;  . Percutaneous coronary stent intervention (pci-s) N/A 10/26/2012    Procedure: PERCUTANEOUS CORONARY STENT INTERVENTION (PCI-S);  Surgeon: MSherren Mocha MD;  [facility];  Service: Cardiovascular;  Laterality: N/A;  . Cardiac catheterization    . Mediastinoscopy N/A 05/03/2016    Procedure: MEDIASTINOSCOPY;  Surgeon: PIvin Poot MD;  [facility];  Service: Thoracic;  Laterality: N/A;  . Video bronchoscopy with endobronchial ultrasound N/A 05/03/2016    Procedure: VIDEO BRONCHOSCOPY WITH ENDOBRONCHIAL ULTRASOUND;  Surgeon: PIvin Poot MD;  [facility];  Service: Thoracic;  Laterality: N/A;    Family History  Problem Relation Age of Onset  . Valvular heart disease Daughter     MVP repair    Social History   Social History  . Marital Status: Married    Spouse Name: N/A  . Number of Children: 4  . Years of Education: N/A   Social History Main Topics  . Smoking status: Former Smoker -- 2.00 packs/day for 20 years    Types: Cigarettes    Quit date: 12/17/1995  . Smokeless tobacco: Never Used     Comment: Quit 1997  . Alcohol Use: No  . Drug Use: No  . Sexual Activity: No   Other Topics Concern  . Not on file   Social History  Narrative   [place]  [place]ives with wife.       PHYSICAL EXAMINATION  ECOG PERFORMANCE STATUS: 1 - Symptomatic but completely ambulatory  There were no vitals filed for this visit.  Blood pressure 97/55 Pulse 87 Respirations 16 Temperature 51F Oxygen saturation 93% on room air.  GENERAL:alert, no distress, well nourished, well developed, comfortable, cooperative, obese, smiling and accompanied by his wife and daughter, in chemo-recliner. SKIN: skin color, texture, turgor are normal, no rashes or significant lesions HEAD: Normocephalic, No masses, lesions, tenderness or abnormalities EYES: normal, EOMI, Conjunctiva are pink and non-injected EARS: External ears normal OROPHARYNX:lips, buccal mucosa, and tongue normal and mucous membranes are moist  NECK: supple, trachea midline LYMPH:  not examined BREAST:not examined LUNGS: clear to auscultation  HEART: regular rate & rhythm ABDOMEN:abdomen soft, obese and normal bowel sounds BACK: Back symmetric, no curvature., No CVA tenderness EXTREMITIES:less then 2 second capillary refill, no joint deformities, effusion, or inflammation, no skin discoloration, no cyanosis, positive findings:  edema bilateral lower  extremity edema 2-3+ pitting ankle and pretibial and 1+ pitting proximal to knee NEURO: alert & oriented x 3 with fluent speech, no focal motor/sensory deficits  LABORATORY DATA: CBC    Component Value Date/Time   WBC 4.9 06/06/2016 0855   RBC 3.43* 06/06/2016 0855   HGB 10.1* 06/06/2016 0855   HCT 30.9* 06/06/2016 0855   PLT 244 06/06/2016 0855   MCV 90.1 06/06/2016 0855   MCH 29.4 06/06/2016 0855   MCHC 32.7 06/06/2016 0855   RDW 17.0* 06/06/2016 0855   LYMPHSABS 0.8 06/06/2016 0855   MONOABS 0.3 06/06/2016 0855   EOSABS 0.0 06/06/2016 0855   BASOSABS 0.0 06/06/2016 0855      Chemistry      Component Value Date/Time   NA 134* 06/06/2016 0855   K 3.7 06/06/2016 0855   CL 98* 06/06/2016 0855   CO2 28  06/06/2016 0855   BUN 23* 06/06/2016 0855   CREATININE 1.10 06/06/2016 0855      Component Value Date/Time   CALCIUM 8.6* 06/06/2016 0855   ALKPHOS 72 06/06/2016 0855   AST 30 06/06/2016 0855   ALT 39 06/06/2016 0855   BILITOT 1.5* 06/06/2016 0855        PENDING LABS:   RADIOGRAPHIC STUDIES:  Nm Pet Image Initial (pi) Skull Base To Thigh  05/10/2016  CLINICAL DATA:  Initial treatment strategy for RIGHT upper lobe and the mediastinal mass. EXAM: NUCLEAR MEDICINE PET SKULL BASE TO THIGH TECHNIQUE: 9.5 mCi F-18 FDG was injected intravenously. Full-ring PET imaging was performed from the skull base to thigh after the radiotracer. CT data was obtained and used for attenuation correction and anatomic localization. FASTING BLOOD GLUCOSE:  Value: 211 mg/dl COMPARISON:  CT thorax 04/29/2016 FINDINGS: NECK No hypermetabolic lymph nodes in the neck. CHEST RIGHT upper lobe mass invading the mediastinum is intensely metabolic with SUV max equals 16.4. The metabolic activity is relatively homogeneous throughout the mass lesion. The lesion measures 4.8 by a 4.1 cm and axial dimension. The mass deviates the trachea and esophagus leftward. There are no hypermetabolic mediastinal lymph nodes. Lobular nodule in the LEFT upper lobe measuring 10 mm (image 76, series 4) with SUV max equal 5.8. Ground-glass nodule in the LEFT upper lobe measuring 15 mm also has associated metabolic activity with SUV max of 2.7 which is high for the low degree of cellularity of this lesion. Of note, there is a well-circumscribed new lesion in the RIGHT lower paratracheal station measuring 3.5 x 2.4 cm with thick rim and internal gas and semi-solid material (image 67, series 4). This presumably represents a post procedural collection following a bronchoscopy and lymph node sampling. There is no significant metabolic activity associated with this lesion which would indicate that lesion is not superinfected. ABDOMEN/PELVIS There is  thickening of LEFT and RIGHT adrenal gland. Adrenal glands have low-attenuation consists with adenomas. Neither of these enlarged gland significant metabolic activity above background liver activity. The RIGHT gland does have a focus nodularity with higher density. No abnormal metabolic activity liver. No hypermetabolic abdominal pelvic lymph nodes. Atherosclerotic calcification aorta. SKELETON No focal hypermetabolic activity to suggest skeletal metastasis. IMPRESSION: 1. Intensely hypermetabolic RIGHT upper lobe mass invading the mediastinum consistent with malignant Pancoast tumor. Metabolic activity is uniform throughout the lesion. 2. No evidence of metastatic adenopathy in the mediastinum. 3. Two LEFT upper lobe pulmonary nodules are concerning for metastatic disease. 4. New gas and semi-solid tissue collection in the RIGHT lower paratracheal location is presumed sequelae of the bronchoscopy  and lymph node sampling. This collection is rather large at 3.5 cm but no significant metabolic activity which suggests a sterile collection. No pneumothorax. 5. Bilateral adrenal adenomas. These results will be called to the ordering clinician or representative by the Radiologist Assistant, and communication documented in the PACS or zVision Dashboard. Electronically Signed   By: Suzy Bouchard M.D.   On: 05/10/2016 14:50   Ct Biopsy  05/17/2016  INDICATION: 82 year old male with a history of right-sided Pancoast tumor. EXAM: CT BIOPSY MEDICATIONS: None. ANESTHESIA/SEDATION: Moderate (conscious) sedation was employed during this procedure. A total of Versed 1.0 mg and Fentanyl 75 mcg was administered intravenously. Moderate Sedation Time: 10 minutes. The patient's level of consciousness and vital signs were monitored continuously by radiology nursing throughout the procedure under my direct supervision. FLUOROSCOPY TIME:  CT COMPLICATIONS: None PROCEDURE: Informed written consent was obtained from the patient after a  thorough discussion of the procedural risks, benefits and alternatives. All questions were addressed. Maximal Sterile Barrier Technique was utilized including caps, mask, sterile gowns, sterile gloves, sterile drape, hand hygiene and skin antiseptic. A timeout was performed prior to the initiation of the procedure. Patient position prone position on CT gantry table and a scout CT was performed for planning purposes. Once a site of approach was selected, the patient is prepped and draped in usual sterile fashion. The skin and subcutaneous tissues were generously infiltrated 1% lidocaine for local anesthesia. A small stab incision was made with 11 blade scalpel, and a 17 gauge guide needle was advanced into the tumor at the right paraspinal region. Once the tip of the needle was confirmed within the lesion, the stylet was removed, and several core biopsy were achieved. Tissue specimens placed into formalin. Needle was removed and a final image was stored. The patient tolerated the procedure well and remained hemodynamically stable throughout. No complications were encountered and no significant blood loss was encountered. IMPRESSION: Status post CT-guided biopsy of right-sided Pancoast tumor. Tissue specimen sent to pathology for complete histopathologic analysis. Signed, Dulcy Fanny. Earleen Newport, DO [facility] Electronically Signed   By: Corrie Mckusick D.O.   On: 05/17/2016 18:20   Dg Chest Port 1 View  05/20/2016  CLINICAL DATA:  PICC line placement. EXAM: PORTABLE CHEST 1 VIEW COMPARISON:  Chest x-ray 05/17/2016 FINDINGS: The right sided PICC line tip is in the mid distal SVC. No complicating features. The permanent left-sided pacemaker is stable. The heart is enlarged but unchanged. Stable surgical changes from bypass surgery. Stable right apical lung mass. IMPRESSION: Right PICC line tip in good position in the mid distal SVC. No complicating features.  Electronically Signed   By: Marijo Sanes M.D.   On: 05/20/2016 15:08   Dg Chest Port 1 View  05/17/2016  CLINICAL DATA:  Post right lung biopsy EXAM: PORTABLE CHEST 1 VIEW COMPARISON:  05/04/2016 FINDINGS: Left pacer remains in place, unchanged. Prior CABG. No pneumothorax following right lung biopsy. Mild cardiomegaly. No confluent opacities or effusions. IMPRESSION: No pneumothorax following right lung biopsy. Electronically Signed   By: Rolm Baptise M.D.   On: 05/17/2016 13:40     PATHOLOGY:    ASSESSMENT AND PLAN:  Pancoast tumor of right lung ([place]) Right pancoast tumor, squamous cell histology with PET imaging on 05/10/2016 suggestive of two LUL pulmonary nodules.  Currently undergoing concomitant chemoXRT consisting of weekly carboplatin/paclitaxel beginning on 05/23/2016.  Oncology history is updated.  Pre-treatment labs today: CBC diff, CMET.  I personally reviewed and  went over laboratory results with the patient.  The results are noted within this dictation.  His labs satisfy treatment parameters today and therefore we will pursue chemotherapy as planned today.  His progressive anemia is noted at 10.1 g/dL and his mild hyperbilirubinemia is appreciated.  No dose changes are needed at this time.  Will need to monitor moving forward.  Last week, Dr. Whitney Muse increased the patient's Fentanyl patch to 50 mcg.  He notes that his pain may be a little improved, but he continues with 5 breakthrough pills in 24 hour period.  He does note increased drowsiness (which he described as "tiredness) since increase in Fentanyl, but this does not interfer with his ADL/IADL.  Therefore, I will not alter his long-acting pain medication at this time, but this may be considered in the future once he is more acclimated to the increase dose of Fentanyl.  "The pain is manageable." I have refilled his Hydrocodone.  He is on a QOD bowel regimen consisting of a 1/2 cap of MiraLax.  This is effective for him.   He  notes increased LE edema.  According to the patient's family, it has progressed and is now proximal to his knee and is palpable on exam in his thigh.  He denies a change in his breathing.  He denies any chest pain.  He notes that the edema is tender and does not improve much in the AM.  It does worsen throughout the day.  He is wearing mid-tibial high compression stockings.  He is on a relatively high dose of Lasix by his cardiologist.  I will message Dr. Lovena Le about this as Mr. Blaike may be demonstrating signs of extravascular fluid overload.  Fortunately, his breathing is near baseline.  He will return next week for follow-up and next cycle of chemotherapy.    ORDERS PLACED FOR THIS ENCOUNTER: No orders of the defined types were placed in this encounter.    MEDICATIONS PRESCRIBED THIS ENCOUNTER: Meds ordered this encounter  Medications  . HYDROcodone-acetaminophen (NORCO) 10-325 MG tablet    Sig: Take 1 tablet by mouth every 4 (four) hours as needed.    Dispense:  90 tablet    Refill:  0    Order Specific Question:  Supervising Provider    Answer:  Patrici Ranks [id number]    THERAPY PLAN:  Continue with treatment as planned.  All questions were answered. The patient knows to call the clinic with any problems, questions or concerns. We can certainly see the patient much sooner if necessary.  Patient and plan discussed with Dr. Ancil Linsey and she is in agreement with the aforementioned.   This note is electronically signed by: Doy Mince 06/06/2016 2:09 PM

## 2016-06-06 NOTE — Progress Notes (Signed)
1305:  Tolerated tx w/o adverse reaction.  A&Ox4, in no distress.  VSS.  Discharged via wheelchair in c/o family.

## 2016-06-07 ENCOUNTER — Ambulatory Visit
Admission: RE | Admit: 2016-06-07 | Discharge: 2016-06-07 | Disposition: A | Discharge status: Home / Self Care | Payer: Medicare Other | Source: Ambulatory Visit | Attending: Radiation Oncology | Admitting: Radiation Oncology

## 2016-06-07 ENCOUNTER — Encounter: Payer: Self-pay | Admitting: Radiation Oncology

## 2016-06-07 VITALS — BP 103/65 | HR 90 | Resp 16 | Wt 216.5 lb

## 2016-06-07 DIAGNOSIS — C3411 Malignant neoplasm of upper lobe, right bronchus or lung: Secondary | ICD-10-CM

## 2016-06-07 DIAGNOSIS — Z510 Encounter for antineoplastic radiation therapy: Secondary | ICD-10-CM | POA: Diagnosis not present

## 2016-06-07 MED ORDER — SUCRALFATE 1 G PO TABS: 1.0000 g | ORAL_TABLET | Freq: Three times a day (TID) | ORAL | Status: DC

## 2016-06-07 NOTE — Progress Notes (Signed)
Weight and vitals stable. Reports right shoulder pain 6 on a scale of 0-10 much better controlled with Fentanyl 50 and neurontin. Reports a rare occasional dry cough. Reports occasional discomfort with swallowing. Hoarseness continues. Denies skin changes within treatment field. Reports SOB with exertion. Report fatigue.   BP 103/65 mmHg  Pulse 90  Resp 16  Wt 216 lb 8 oz (98.204 kg)  SpO2 97% Wt Readings from Last 3 Encounters:  06/07/16 216 lb 8 oz (98.204 kg)  06/06/16 214 lb 9.6 oz (97.342 kg)  05/31/16 210 lb 14.4 oz (95.664 kg)

## 2016-06-07 NOTE — Progress Notes (Signed)
  Radiation Oncology         [phone]   Name: George Pollard MRN: [id number]   Date: 06/07/2016  DOB: 06-Oct-1933   Weekly Radiation Therapy Management    ICD-9-CM ICD-10-CM   1. Pancoast tumor of right lung ([facility]) 162.3 C34.11 sucralfate (CARAFATE) 1 g tablet    Current Dose: 22 Gy  Planned Dose:  66 Gy  Narrative The patient presents for routine under treatment assessment. Reports right shoulder pain as a 6/10. It is much better controlled with Fentanyl 3mg and neurontin. Reports an occasional dry cough. Reports occasional discomfort with swallowing. He has MVeterinary surgeon Hoarseness continues. Denies skin changes within the treatment field. Reports SOB with exertion. Report fatigue. He had constipation, but it is better now. Set-up films were reviewed. The chart was checked.  Physical Findings  weight is 216 lb 8 oz (98.204 kg). His blood pressure is 103/65 and his pulse is 90. His respiration is 16 and oxygen saturation is 97%. . Weight essentially stable.  No significant changes. The patient's voice continues to be hoarse. Presents to the clinic in a wheelchair. Edema noted in the bilateral lower extremities.  Impression The patient is tolerating radiation.  Plan Continue treatment as planned. The patient was prescribed Carafate to help with swallowing food. The patient should continue Lasix, this weekend to get his swelling down.         MSheral ApleyMTammi Klippel M.D.  This document serves as a record of services personally performed by MTyler Pita MD. It was created on his behalf by JDarcus Austin a trained medical scribe. The creation of this record is based on the scribe's personal observations and the provider's statements to them. This document has been checked and approved by the attending provider.

## 2016-06-10 ENCOUNTER — Ambulatory Visit
Admission: RE | Admit: 2016-06-10 | Discharge: 2016-06-10 | Disposition: A | Discharge status: Home / Self Care | Payer: Medicare Other | Source: Ambulatory Visit | Attending: Radiation Oncology | Admitting: Radiation Oncology

## 2016-06-10 DIAGNOSIS — Z510 Encounter for antineoplastic radiation therapy: Secondary | ICD-10-CM | POA: Diagnosis not present

## 2016-06-11 ENCOUNTER — Ambulatory Visit
Admission: RE | Admit: 2016-06-11 | Discharge: 2016-06-11 | Disposition: A | Discharge status: Home / Self Care | Payer: Medicare Other | Source: Ambulatory Visit | Attending: Radiation Oncology | Admitting: Radiation Oncology

## 2016-06-11 DIAGNOSIS — Z510 Encounter for antineoplastic radiation therapy: Secondary | ICD-10-CM | POA: Diagnosis not present

## 2016-06-12 ENCOUNTER — Telehealth (HOSPITAL_COMMUNITY): Payer: Self-pay

## 2016-06-12 ENCOUNTER — Emergency Department (HOSPITAL_COMMUNITY)
Admission: EM | Admit: 2016-06-12 | Discharge: 2016-06-12 | Disposition: A | Discharge status: Home / Self Care | Payer: Medicare Other | Attending: Emergency Medicine | Admitting: Emergency Medicine

## 2016-06-12 ENCOUNTER — Ambulatory Visit
Admission: RE | Admit: 2016-06-12 | Discharge: 2016-06-12 | Disposition: A | Discharge status: Home / Self Care | Payer: Medicare Other | Source: Ambulatory Visit | Attending: Radiation Oncology | Admitting: Radiation Oncology

## 2016-06-12 ENCOUNTER — Encounter (HOSPITAL_COMMUNITY): Payer: Self-pay | Admitting: Emergency Medicine

## 2016-06-12 ENCOUNTER — Emergency Department (HOSPITAL_COMMUNITY): Payer: Medicare Other

## 2016-06-12 DIAGNOSIS — Z7901 Long term (current) use of anticoagulants: Secondary | ICD-10-CM | POA: Insufficient documentation

## 2016-06-12 DIAGNOSIS — I5020 Unspecified systolic (congestive) heart failure: Secondary | ICD-10-CM | POA: Insufficient documentation

## 2016-06-12 DIAGNOSIS — R609 Edema, unspecified: Secondary | ICD-10-CM

## 2016-06-12 DIAGNOSIS — Z79891 Long term (current) use of opiate analgesic: Secondary | ICD-10-CM | POA: Insufficient documentation

## 2016-06-12 DIAGNOSIS — Z87891 Personal history of nicotine dependence: Secondary | ICD-10-CM | POA: Diagnosis not present

## 2016-06-12 DIAGNOSIS — I4891 Unspecified atrial fibrillation: Secondary | ICD-10-CM | POA: Diagnosis not present

## 2016-06-12 DIAGNOSIS — I110 Hypertensive heart disease with heart failure: Secondary | ICD-10-CM | POA: Diagnosis not present

## 2016-06-12 DIAGNOSIS — Z85110 Personal history of malignant carcinoid tumor of bronchus and lung: Secondary | ICD-10-CM | POA: Insufficient documentation

## 2016-06-12 DIAGNOSIS — R600 Localized edema: Secondary | ICD-10-CM | POA: Diagnosis not present

## 2016-06-12 DIAGNOSIS — Z79899 Other long term (current) drug therapy: Secondary | ICD-10-CM | POA: Insufficient documentation

## 2016-06-12 DIAGNOSIS — I2510 Atherosclerotic heart disease of native coronary artery without angina pectoris: Secondary | ICD-10-CM | POA: Diagnosis not present

## 2016-06-12 DIAGNOSIS — Z7982 Long term (current) use of aspirin: Secondary | ICD-10-CM | POA: Diagnosis not present

## 2016-06-12 DIAGNOSIS — Z510 Encounter for antineoplastic radiation therapy: Secondary | ICD-10-CM | POA: Diagnosis not present

## 2016-06-12 DIAGNOSIS — K5900 Constipation, unspecified: Secondary | ICD-10-CM | POA: Insufficient documentation

## 2016-06-12 DIAGNOSIS — E1151 Type 2 diabetes mellitus with diabetic peripheral angiopathy without gangrene: Secondary | ICD-10-CM | POA: Diagnosis not present

## 2016-06-12 DIAGNOSIS — Z7984 Long term (current) use of oral hypoglycemic drugs: Secondary | ICD-10-CM | POA: Diagnosis not present

## 2016-06-12 DIAGNOSIS — M7989 Other specified soft tissue disorders: Secondary | ICD-10-CM | POA: Diagnosis present

## 2016-06-12 HISTORY — DX: Cardiomyopathy, unspecified: I42.9

## 2016-06-12 HISTORY — DX: Pain in unspecified shoulder: M25.519

## 2016-06-12 HISTORY — DX: Personal history of other medical treatment: Z92.89

## 2016-06-12 LAB — URINALYSIS, ROUTINE W REFLEX MICROSCOPIC
BILIRUBIN URINE: NEGATIVE
Glucose, UA: 100 mg/dL — AB
HGB URINE DIPSTICK: NEGATIVE
Ketones, ur: NEGATIVE mg/dL
Leukocytes, UA: NEGATIVE
Nitrite: NEGATIVE
PROTEIN: NEGATIVE mg/dL
Specific Gravity, Urine: 1.015 (ref 1.005–1.030)
pH: 6 (ref 5.0–8.0)

## 2016-06-12 LAB — CBC WITH DIFFERENTIAL/PLATELET
BASOS ABS: 0 10*3/uL (ref 0.0–0.1)
Basophils Relative: 0 %
EOS PCT: 1 %
Eosinophils Absolute: 0 10*3/uL (ref 0.0–0.7)
HCT: 30.9 % — ABNORMAL LOW (ref 39.0–52.0)
Hemoglobin: 10 g/dL — ABNORMAL LOW (ref 13.0–17.0)
LYMPHS PCT: 15 %
Lymphs Abs: 0.7 10*3/uL (ref 0.7–4.0)
MCH: 29.3 pg (ref 26.0–34.0)
MCHC: 32.4 g/dL (ref 30.0–36.0)
MCV: 90.6 fL (ref 78.0–100.0)
Monocytes Absolute: 0.2 10*3/uL (ref 0.1–1.0)
Monocytes Relative: 4 %
Neutro Abs: 3.8 10*3/uL (ref 1.7–7.7)
Neutrophils Relative %: 80 %
PLATELETS: 243 10*3/uL (ref 150–400)
RBC: 3.41 MIL/uL — AB (ref 4.22–5.81)
RDW: 17.1 % — ABNORMAL HIGH (ref 11.5–15.5)
WBC: 4.8 10*3/uL (ref 4.0–10.5)

## 2016-06-12 LAB — COMPREHENSIVE METABOLIC PANEL
ALBUMIN: 2.7 g/dL — AB (ref 3.5–5.0)
ALT: 35 U/L (ref 17–63)
AST: 26 U/L (ref 15–41)
Alkaline Phosphatase: 65 U/L (ref 38–126)
Anion gap: 10 (ref 5–15)
BUN: 23 mg/dL — AB (ref 6–20)
CHLORIDE: 100 mmol/L — AB (ref 101–111)
CO2: 27 mmol/L (ref 22–32)
Calcium: 8.7 mg/dL — ABNORMAL LOW (ref 8.9–10.3)
Creatinine, Ser: 0.98 mg/dL (ref 0.61–1.24)
GFR calc Af Amer: >60 mL/min (ref >60)
Glucose, Bld: 176 mg/dL — ABNORMAL HIGH (ref 65–99)
Potassium: 3.5 mmol/L (ref 3.5–5.1)
SODIUM: 137 mmol/L (ref 135–145)
Total Bilirubin: 1.1 mg/dL (ref 0.3–1.2)
Total Protein: 6 g/dL — ABNORMAL LOW (ref 6.5–8.1)

## 2016-06-12 LAB — TROPONIN I

## 2016-06-12 LAB — BRAIN NATRIURETIC PEPTIDE: B NATRIURETIC PEPTIDE 5: 224 pg/mL — AB (ref 0.0–100.0)

## 2016-06-12 LAB — PROTIME-INR
INR: 4 — AB (ref 0.00–1.49)
Prothrombin Time: 38 seconds — ABNORMAL HIGH (ref 11.6–15.2)

## 2016-06-12 NOTE — Telephone Encounter (Signed)
Tried to call patient back and im not able to leave a voicemail at this time.

## 2016-06-12 NOTE — ED Provider Notes (Signed)
CSN: [id number]     Arrival date & time 06/12/16  1733 History   First MD Initiated Contact with Patient 06/12/16 1814     Chief Complaint  Patient presents with  . Leg Swelling      HPI  Pt was seen at 1815. Per pt and his family, c/o gradual onset and worsening of persistent "legs swelling" for the past 2 months, worse over the past 3 weeks. Pt states the swelling increased "after he took chemo" for dx lung CA. Has been associated with generalized weakness/fatigue. Pt's family states pt has been "soiling himself." Pt further describes this as he "went before I got to the bathroom." Endorses constipation and has been taking miralax for the past 3 weeks. Pt states he was "going a lot" so he "cut back on the miralax." Pt states he "feels like I just have to pass gas and stool comes out with it." States this has improved since he cut back his dose/dosing of miralax. Endorses feeling the sensation to urinate/stool. Pt states he was "called by my Oncologist and told to come to the hospital." Pt's family also states pt "hasn't walked in 3 or 4 days." Pt himself states he "gets my cane and walks outside" for the past several days. Denies back pain, no saddle anesthesia, no new focal motor weakness from baseline RUE weakness. Denies abd pain, no N/V, no black or blood in stools, no CP/SOB, no fevers, no rash.     Past Medical History  Diagnosis Date  . CAD (coronary artery disease)     a. CABG 1997. b. NSTEMI (troponin >20) s/p PTCA/DES to LCx 10/21/12, overlapping DES to mid & distal RCA 10/26/12; c. 02/2016 MV: EF 37%, large fixed defect - apex, inf, septal, lat wall, no ischemia.  . Atrial fibrillation ([place])   . Hypertension   . BPH (benign prostatic hyperplasia)   . PVD (peripheral vascular disease) ([place])     a. AAA repair 2001.  Marland Kitchen Systolic CHF ([place])     a. 10/2012: acute on chronic systolic CHF / pulmonary edema secondary to severe mixed cardiomyopathy;  b. 02/2016 Echo: EF 30-35%, sev dil RV, RV  dysfxn, massively dil RA, PASP 94mHg.  . Diabetes mellitus ([place]     a. Noted 10/2012 (prior hx of DM resolved with weight loss).  . Ventricular tachycardia ([place]     a. 02/2016 4 ICD shocks for VT-->Amio added.  .Marland KitchenAICD (automatic cardioverter/defibrillator) present   . Shortness of breath dyspnea   . Anxiety   . Lung cancer ([place]     pancoast tumor right lung apex invading mediastinum and C7, T1, T2 with extension into the neural foramina and into the spinal canal  . Pancoast tumor of right lung ([place] 05/17/2016  . Cardiomyopathy ([place]   . H/O echocardiogram 02/2016    EF 30-35%  . Shoulder pain    Past Surgical History  Procedure Laterality Date  . Coronary artery bypass graft      1997  . Abdominal aortic aneurysm repair      08/26/2000  . Cataract extraction    . Tonsillectomy    . Left heart catheterization with coronary/graft angiogram N/A 10/21/2012    Procedure: LEFT HEART CATHETERIZATION WITH CBeatrix Fetters  Surgeon: CBurnell Blanks MD;  [facility];  Service: Cardiovascular;  Laterality: N/A;  . Percutaneous coronary stent intervention (pci-s) N/A 10/26/2012    Procedure: PERCUTANEOUS CORONARY STENT INTERVENTION (PCI-S);  Surgeon: MSherren Mocha MD;  Location:  [facility] CATH LAB;  Service: Cardiovascular;  Laterality: N/A;  . Cardiac catheterization    . Mediastinoscopy N/A 05/03/2016    Procedure: MEDIASTINOSCOPY;  Surgeon: Ivin Poot, MD;  [facility];  Service: Thoracic;  Laterality: N/A;  . Video bronchoscopy with endobronchial ultrasound N/A 05/03/2016    Procedure: VIDEO BRONCHOSCOPY WITH ENDOBRONCHIAL ULTRASOUND;  Surgeon: Ivin Poot, MD;  Location: [facility] OR;  Service: Thoracic;  Laterality: N/A;   Family History  Problem Relation Age of Onset  . Valvular heart disease Daughter     MVP repair   Social History  Substance Use Topics  . Smoking status: Former Smoker -- 2.00 packs/day for 20 years    Types: Cigarettes    Quit date:  12/17/1995  . Smokeless tobacco: Never Used     Comment: Quit 1997  . Alcohol Use: No    Review of Systems ROS: Statement: All systems negative except as marked or noted in the HPI; Constitutional: +generalized fatigue. Negative for fever and chills. ; ; Eyes: Negative for eye pain, redness and discharge. ; ; ENMT: Negative for ear pain, hoarseness, nasal congestion, sinus pressure and sore throat. ; ; Cardiovascular: Negative for chest pain, palpitations, diaphoresis, dyspnea and +peripheral edema. ; ; Respiratory: Negative for cough, wheezing and stridor. ; ; Gastrointestinal: +abd "swelling." Negative for nausea, vomiting, diarrhea, abdominal pain, blood in stool, hematemesis, jaundice and rectal bleeding. . ; ; Genitourinary: Negative for dysuria, flank pain and hematuria. ; ; Musculoskeletal: Negative for back pain and neck pain. Negative for trauma.; ; Skin: Negative for pruritus, rash, abrasions, blisters, bruising and skin lesion.; ; Neuro: Negative for headache, lightheadedness and neck stiffness. Negative for altered level of consciousness, altered mental status, extremity weakness, paresthesias, involuntary movement, seizure and syncope.      Allergies  Review of patient's allergies indicates no known allergies.  Home Medications   Prior to Admission medications   Medication Sig Start Date End Date Taking? Authorizing Provider  amiodarone (PACERONE) 200 MG tablet Take 1 tablet by mouth 2 (two) times daily. 03/26/16  Yes Historical Provider, MD  aspirin EC 81 MG tablet Take 81 mg by mouth daily.   Yes Historical Provider, MD  atorvastatin (LIPITOR) 80 MG tablet take 1 tablet by mouth once daily 12/18/12  Yes Jolaine Artist, MD  CARBOPLATIN IV Inject into the vein. To be given weekly with radiation   Yes Historical Provider, MD  fentaNYL (DURAGESIC - DOSED MCG/HR) 50 MCG/HR Place 1 patch (50 mcg total) onto the skin every 3 (three) days. 05/30/16  Yes Patrici Ranks, MD  furosemide  (LASIX) 40 MG tablet Take 1 tablet (40 mg total) by mouth 2 (two) times daily. Patient taking differently: Take 80 mg by mouth 2 (two) times daily.  04/05/16  Yes Evans Lance, MD  gabapentin (NEURONTIN) 300 MG capsule Take 1 capsule (300 mg total) by mouth at bedtime. 05/30/16  Yes Patrici Ranks, MD  Heparin Lock Flush (HEPARIN FLUSH, PORCINE,) 100 UNIT/ML injection Flush PICC line twice a week. First flush with Saline. Then Flush PICC line with 2.69m of Heparin. 05/28/16  Yes SPatrici Ranks MD  HYDROcodone-acetaminophen (NORCO) 10-325 MG tablet Take 1 tablet by mouth every 4 (four) hours as needed. Patient taking differently: Take 1 tablet by mouth every 4 (four) hours as needed for moderate pain.  06/06/16  Yes TBaird Cancer PA-C  lisinopril (PRINIVIL,ZESTRIL) 2.5 MG tablet take 1 tablet by mouth once daily 12/18/12  Yes DQuillian Quince  R Bensimhon, MD  metFORMIN (GLUCOPHAGE) 500 MG tablet Take 500 mg by mouth daily.  01/22/16  Yes Historical Provider, MD  Metoprolol Tartrate (LOPRESSOR) 50 MG tablet Take 1.5 tablets (75 mg total) by mouth 2 (two) times daily. 03/01/16  Yes Rogelia Mire, NP  nitroGLYCERIN (NITROSTAT) 0.4 MG SL tablet Place 1 tablet (0.4 mg total) under the tongue every 5 (five) minutes as needed for chest pain (up to 3 doses). 10/29/12  Yes Dayna N Dunn, PA-C  ondansetron (ZOFRAN) 8 MG tablet Take 1 tablet (8 mg total) by mouth every 8 (eight) hours as needed for nausea or vomiting. 05/15/16  Yes Patrici Ranks, MD  oxyCODONE (OXY IR/ROXICODONE) 5 MG immediate release tablet TAKE ONE TO TWO TABLETS BY MOUTH EVERY SIX HOURS AS NEEDED FOR SEVERE PAIN 05/04/16  Yes Historical Provider, MD  PACLitaxel (TAXOL IV) Inject into the vein. To be given weekly with radiation   Yes Historical Provider, MD  potassium chloride SA (K-DUR,KLOR-CON) 20 MEQ tablet Take 1 tablet by mouth daily. 01/10/16  Yes Historical Provider, MD  prochlorperazine (COMPAZINE) 10 MG tablet Take 1 tablet (10 mg  total) by mouth every 6 (six) hours as needed for nausea or vomiting. 05/15/16  Yes Patrici Ranks, MD  Sodium Chloride Flush (NORMAL SALINE FLUSH) 0.9 % SOLN Flush PICC line twice a week with 52m prior to flushing with Heparin. 05/20/16  Yes TManon HildingKefalas, PA-C  sucralfate (CARAFATE) 1 g tablet Take 1 tablet (1 g total) by mouth 4 (four) times daily -  with meals and at bedtime. 5 min before meals for radiation induced esophagitis 06/07/16  Yes MTyler Pita MD  warfarin (COUMADIN) 10 MG tablet Take 5 mg by mouth every evening. 05/13/16  Yes Historical Provider, MD  warfarin (COUMADIN) 5 MG tablet Take 1 tablet (5 mg total) by mouth daily at 6 PM. Patient not taking: Reported on 06/12/2016 03/01/16   CRogelia Mire NP   BP 96/49 mmHg  Pulse 88  Temp(Src) 98.3 F (36.8 C) (Oral)  Resp 18  Ht '5\' 10"'$  (1.778 m)  Wt 215 lb (97.523 kg)  BMI 30.85 kg/m2  SpO2 99%   18:32:42 Orthostatic Vital Signs MW  Orthostatic Lying  - BP- Lying: 101/62 mmHg ; Pulse- Lying: 91  Orthostatic Sitting - BP- Sitting: 90/52 mmHg ; Pulse- Sitting: 83  Orthostatic Standing at 0 minutes - BP- Standing at 0 minutes: 91/55 mmHg ; Pulse- Standing at 0 minutes: 90       Physical Exam  1820: Physical examination:  Nursing notes reviewed; Vital signs and O2 SAT reviewed;  Constitutional: Well developed, Well nourished, Well hydrated, In no acute distress; Head:  Normocephalic, atraumatic; Eyes: EOMI, PERRL, No scleral icterus; ENMT: Mouth and pharynx normal, Mucous membranes moist; Neck: Supple, Full range of motion, No lymphadenopathy; Cardiovascular: Irregular rate and rhythm, No gallop; Respiratory: Breath sounds clear & equal bilaterally, No wheezes.  Speaking full sentences with ease, Normal respiratory effort/excursion; Chest: Nontender, Movement normal; Abdomen: Nontender, +tympanitic, softly distended, Normal bowel sounds. Rectal exam performed w/permission of pt and ED RN chaperone present.  Anal tone  normal. No fecal leakage. Superficial areas of skin breakdown noted to each buttock.; Genitourinary: No CVA tenderness; Extremities: Pulses normal, No tenderness, +2 pedal edema bilat, No calf tenderness or asymmetry.; Neuro: AA&Ox3, Major CN grossly intact.  Speech clear. Strength 5/5 equal LUE and bilat LE's, 4/5 strength RUE per baseline. No gross focal motor or sensory deficits in extremities.; Skin:  Color normal, Warm, Dry.   ED Course  Procedures (including critical care time) Labs Review  Imaging Review  I have personally reviewed and evaluated these images and lab results as part of my medical decision-making.   EKG Interpretation   Date/Time:  Wednesday June 12 2016 18:13:17 EDT Ventricular Rate:  88 PR Interval:    QRS Duration: 168 QT Interval:  409 QTC Calculation: 501 R Axis:   -69 Text Interpretation:  Atrial fibrillation Ventricular premature complex  Left axis deviation Right bundle branch block Baseline wander When  compared with ECG of 02/28/2016 No significant change was found Confirmed  by [facility]  MD, Nunzio Cory [phone]) on 06/12/2016 6:39:40 PM      MDM  MDM Reviewed: previous chart, nursing note and vitals Reviewed previous: labs and ECG Interpretation: labs, ECG and x-ray     Results for orders placed or performed during the hospital encounter of 06/12/16  CBC with Differential  Result Value Ref Range   WBC 4.8 4.0 - 10.5 K/uL   RBC 3.41 (L) 4.22 - 5.81 MIL/uL   Hemoglobin 10.0 (L) 13.0 - 17.0 g/dL   HCT 30.9 (L) 39.0 - 52.0 %   MCV 90.6 78.0 - 100.0 fL   MCH 29.3 26.0 - 34.0 pg   MCHC 32.4 30.0 - 36.0 g/dL   RDW 17.1 (H) 11.5 - 15.5 %   Platelets 243 150 - 400 K/uL   Neutrophils Relative % 80 %   Neutro Abs 3.8 1.7 - 7.7 K/uL   Lymphocytes Relative 15 %   Lymphs Abs 0.7 0.7 - 4.0 K/uL   Monocytes Relative 4 %   Monocytes Absolute 0.2 0.1 - 1.0 K/uL   Eosinophils Relative 1 %   Eosinophils Absolute 0.0 0.0 - 0.7 K/uL   Basophils Relative 0 %    Basophils Absolute 0.0 0.0 - 0.1 K/uL  Comprehensive metabolic panel  Result Value Ref Range   Sodium 137 135 - 145 mmol/L   Potassium 3.5 3.5 - 5.1 mmol/L   Chloride 100 (L) 101 - 111 mmol/L   CO2 27 22 - 32 mmol/L   Glucose, Bld 176 (H) 65 - 99 mg/dL   BUN 23 (H) 6 - 20 mg/dL   Creatinine, Ser 0.98 0.61 - 1.24 mg/dL   Calcium 8.7 (L) 8.9 - 10.3 mg/dL   Total Protein 6.0 (L) 6.5 - 8.1 g/dL   Albumin 2.7 (L) 3.5 - 5.0 g/dL   AST 26 15 - 41 U/L   ALT 35 17 - 63 U/L   Alkaline Phosphatase 65 38 - 126 U/L   Total Bilirubin 1.1 0.3 - 1.2 mg/dL   GFR calc non Af Amer >60 >60 mL/min   GFR calc Af Amer >60 >60 mL/min   Anion gap 10 5 - 15  Brain natriuretic peptide  Result Value Ref Range   B Natriuretic Peptide 224.0 (H) 0.0 - 100.0 pg/mL  Troponin I  Result Value Ref Range   Troponin I <0.03 <0.03 ng/mL  Protime-INR  Result Value Ref Range   Prothrombin Time 38.0 (H) 11.6 - 15.2 seconds   INR 4.00 (H) 0.00 - 1.49  Urinalysis, Routine w reflex microscopic  Result Value Ref Range   Color, Urine YELLOW YELLOW   APPearance CLEAR CLEAR   Specific Gravity, Urine 1.015 1.005 - 1.030   pH 6.0 5.0 - 8.0   Glucose, UA 100 (A) NEGATIVE mg/dL   Hgb urine dipstick NEGATIVE NEGATIVE   Bilirubin Urine NEGATIVE NEGATIVE  Ketones, ur NEGATIVE NEGATIVE mg/dL   Protein, ur NEGATIVE NEGATIVE mg/dL   Nitrite NEGATIVE NEGATIVE   Leukocytes, UA NEGATIVE NEGATIVE   Dg Chest Portable 1 View 06/12/2016  CLINICAL DATA:  Bilateral lower extremity swelling, abdominal swelling and fatigue. History of lung cancer. EXAM: PORTABLE CHEST 1 VIEW COMPARISON:  05/20/2016. FINDINGS: Stable mild cardiac enlargement. Stable tortuosity and calcification of the thoracic aorta. Stable surgical changes from bypass surgery. The right ventricular pacer wire and right PICC line are stable. Stable right paratracheal soft tissue density. No acute overlying pulmonary process. No pleural effusions. The bony thorax is  intact. IMPRESSION: No acute cardiopulmonary findings. Electronically Signed   By: Marijo Sanes M.D.   On: 06/12/2016 18:15   Dg Abd 2 Views 06/12/2016  CLINICAL DATA:  82 year old male with constipation and bilateral leg swelling. Abdominal pain. EXAM: ABDOMEN - 2 VIEW COMPARISON:  CT of the abdomen pelvis dated 04/29/2016 FINDINGS: Copious amount of dense stool noted throughout the colon. There is no bowel dilatation or evidence of obstruction. No free air. No radiopaque calculi. There is advanced osteopenia with extensive degenerative changes of the spine and scoliosis. No acute fracture. Multiple surgical clips noted in the upper abdomen. Bibasilar linear atelectasis/ scarring. A small right pleural effusion noted. There is median sternotomy wires and CABG vascular clips. Left pectoral AICD device. IMPRESSION: Constipation.  No bowel obstruction. Electronically Signed   By: Anner Crete M.D.   On: 06/12/2016 19:57   Results for GREIG, ALTERGOTT (MRN [id number]) as of 06/12/2016 21:09  Ref. Range 02/28/2016 02:15 06/12/2016 18:45  B Natriuretic Peptide Latest Ref Range: 0.0-100.0 pg/mL 242.2 (H) 224.0 (H)    2200:  Pt urinated in urinal while in the ED (no incont of urine). Rectal tone normal. Neuro exam remains intact. Pt has stood at bedside without difficulty. Pt has climbed off the stretcher by himself without difficulty, ambulated around the nurses' station with and without his walker with steady gait, easy resps, NAD. Pt denies any complaints (specifically CP or SOB) and wants to go home now. T/C to [facility] Dr. Darrick Meigs, case discussed, including:  HPI, pertinent PM/SHx, VS/PE, dx testing, ED course and treatment:  He has reviewed workup today, no clear indication for admission at this time, c/b if needed after speaking with Heme/Onc MD.  T/C to Heme/Onc Dr. Whitney Muse, case discussed, including:  HPI, pertinent PM/SHx, VS/PE, dx testing, ED course and treatment:  Agrees no indication for admission at  this time, she will see pt in the morning, if family is concerned they can hold his meds until he is seen by her and they will discuss what meds/doses to take. Dx and testing, as well as d/w [facility] MD and Heme/Onc MD, d/w pt and family.  Questions answered.  Verb understanding. Pt's family now is agreeable to d/c home with outpt f/u in the morning.    Francine Graven, DO 06/16/16 1424

## 2016-06-12 NOTE — Discharge Instructions (Signed)
Hold your coumadin (warfarin) dose tonight (your INR was 4). Go to your Oncology appointment tomorrow as previously scheduled. Return to the Emergency Department immediately sooner if worsening.

## 2016-06-12 NOTE — ED Notes (Addendum)
Pt sent over by oncologist for bilateral leg swelling, abdominal swelling, and intermittent hand swelling. Pt also c/o increase in fatigue and urinary/bowel incontinence. Pt and family reports swelling began shortly after began chemo/radiation therapy for lung cancer.

## 2016-06-12 NOTE — ED Notes (Addendum)
Pt ambulated around nursing station with no issues/no symptoms. Patient ambulated without walker for a portion of the walk. States "im fine, tired from all my appointments, dont know why I had to come up here". BP 126/54 after ambulation

## 2016-06-12 NOTE — Telephone Encounter (Signed)
-----   Message from Baird Cancer, PA-C sent at 06/12/2016  2:47 PM EDT ----- Dr. Lovena Le just now responded to me today.  Call patient.  If swelling persists, have him increase Lasix by 1 pill daily x 5-7 days.  TK   ----- Message -----    From: Evans Lance, MD    Sent: 06/11/2016  10:15 PM      To: Baird Cancer, PA-C  It is reasonable to increase the lasix. Wonder what has changed to cause his worsening fluid accumulation. He will need his BMP followed. GT ----- Message -----    From: Baird Cancer, PA-C    Sent: 06/06/2016   2:18 PM      To: Evans Lance, MD  Good afternoon,  We share Mr. Jarboe as a patient and I was hoping you may be able to help or provide a recommendation(s).  Mr. Turay is a nice white man who has a right pancoast tumor, squamous cell histology and suspicious LUL pulmonary nodules.  He is currently undergoing concomitant chemoradiation with weekly carboplatin/paclitaxel.  I saw him today for the first time and the patient and family are concerned about his lower extremity edema.  He has 2-3 pitting edema in ankles and pre-tibial region and 1+ pitting edema proximal to his knee to about mid-thigh.  He is wearing compression stockings to his mid-tibial region that are helpful, but not improving the issue.  I suspect he is not doing a good job with conservative management with lower extremity elevation, and of course, this was recommended today during my visit with him.  On his medication list, I see that you prescribe Lasix for him.  It is noted that the Rx on his medication list demonstrates 80 mg daily of Lasix, but the patient's family reports that he is taking 120 mg daily.  His renal function is stable to improved.  Do you think I should increase the Lasix dose for a short time (7-10 days?). Add Aldactone 100 mg daily for a few days?    Just wanted to run it past you first since the patient does have a significant cardiac history.  Thanks in  advance  Kirby Crigler, PA-C [facility]

## 2016-06-13 ENCOUNTER — Ambulatory Visit
Admission: RE | Admit: 2016-06-13 | Discharge: 2016-06-13 | Disposition: A | Discharge status: Home / Self Care | Payer: Medicare Other | Source: Ambulatory Visit | Attending: Radiation Oncology | Admitting: Radiation Oncology

## 2016-06-13 ENCOUNTER — Encounter (HOSPITAL_COMMUNITY): Payer: Self-pay | Admitting: Hematology & Oncology

## 2016-06-13 ENCOUNTER — Encounter (HOSPITAL_BASED_OUTPATIENT_CLINIC_OR_DEPARTMENT_OTHER): Payer: Medicare Other | Admitting: Hematology & Oncology

## 2016-06-13 ENCOUNTER — Encounter: Payer: Self-pay | Admitting: Radiation Oncology

## 2016-06-13 ENCOUNTER — Encounter (HOSPITAL_BASED_OUTPATIENT_CLINIC_OR_DEPARTMENT_OTHER): Payer: Medicare Other

## 2016-06-13 ENCOUNTER — Inpatient Hospital Stay (HOSPITAL_COMMUNITY): Payer: Medicare Other

## 2016-06-13 VITALS — BP 118/53 | HR 92 | Temp 97.7°F | Resp 18

## 2016-06-13 VITALS — BP 101/58 | HR 87 | Temp 97.6°F | Resp 18 | Wt 210.0 lb

## 2016-06-13 VITALS — BP 114/72 | HR 98 | Resp 18 | Wt 210.0 lb

## 2016-06-13 DIAGNOSIS — C7951 Secondary malignant neoplasm of bone: Secondary | ICD-10-CM

## 2016-06-13 DIAGNOSIS — R918 Other nonspecific abnormal finding of lung field: Secondary | ICD-10-CM

## 2016-06-13 DIAGNOSIS — D649 Anemia, unspecified: Secondary | ICD-10-CM

## 2016-06-13 DIAGNOSIS — Z510 Encounter for antineoplastic radiation therapy: Secondary | ICD-10-CM | POA: Diagnosis not present

## 2016-06-13 DIAGNOSIS — C3411 Malignant neoplasm of upper lobe, right bronchus or lung: Secondary | ICD-10-CM | POA: Diagnosis not present

## 2016-06-13 DIAGNOSIS — Z5111 Encounter for antineoplastic chemotherapy: Secondary | ICD-10-CM

## 2016-06-13 DIAGNOSIS — K5900 Constipation, unspecified: Secondary | ICD-10-CM

## 2016-06-13 DIAGNOSIS — R29898 Other symptoms and signs involving the musculoskeletal system: Secondary | ICD-10-CM

## 2016-06-13 DIAGNOSIS — L8993 Pressure ulcer of unspecified site, stage 3: Secondary | ICD-10-CM | POA: Insufficient documentation

## 2016-06-13 DIAGNOSIS — R600 Localized edema: Secondary | ICD-10-CM

## 2016-06-13 DIAGNOSIS — G893 Neoplasm related pain (acute) (chronic): Secondary | ICD-10-CM

## 2016-06-13 DIAGNOSIS — L89322 Pressure ulcer of left buttock, stage 2: Secondary | ICD-10-CM

## 2016-06-13 MED ORDER — DIPHENHYDRAMINE HCL 50 MG/ML IJ SOLN
INTRAMUSCULAR | Status: AC
  Filled 2016-06-13: qty 1

## 2016-06-13 MED ORDER — PALONOSETRON HCL INJECTION 0.25 MG/5ML
0.2500 mg | Freq: Once | INTRAVENOUS | Status: AC
  Administered 2016-06-13: 0.25 mg via INTRAVENOUS

## 2016-06-13 MED ORDER — DIPHENHYDRAMINE HCL 50 MG/ML IJ SOLN
50.0000 mg | Freq: Once | INTRAMUSCULAR | Status: AC
  Administered 2016-06-13: 50 mg via INTRAVENOUS

## 2016-06-13 MED ORDER — SODIUM CHLORIDE 0.9 % IV SOLN
20.0000 mg | Freq: Once | INTRAVENOUS | Status: AC
  Administered 2016-06-13: 20 mg via INTRAVENOUS
  Filled 2016-06-13: qty 2

## 2016-06-13 MED ORDER — FAMOTIDINE IN NACL 20-0.9 MG/50ML-% IV SOLN
INTRAVENOUS | Status: AC
  Filled 2016-06-13: qty 50

## 2016-06-13 MED ORDER — HEPARIN SOD (PORK) LOCK FLUSH 100 UNIT/ML IV SOLN
500.0000 [IU] | Freq: Once | INTRAVENOUS | Status: AC | PRN
  Administered 2016-06-13: 250 [IU]

## 2016-06-13 MED ORDER — PACLITAXEL CHEMO INJECTION 300 MG/50ML
45.0000 mg/m2 | Freq: Once | INTRAVENOUS | Status: AC
  Administered 2016-06-13: 96 mg via INTRAVENOUS
  Filled 2016-06-13: qty 16

## 2016-06-13 MED ORDER — CARBOPLATIN CHEMO INJECTION 450 MG/45ML
210.4000 mg | Freq: Once | INTRAVENOUS | Status: AC
  Administered 2016-06-13: 210 mg via INTRAVENOUS
  Filled 2016-06-13: qty 21

## 2016-06-13 MED ORDER — SODIUM CHLORIDE 0.9% FLUSH: 10.0000 mL | INTRAVENOUS | Status: DC | PRN

## 2016-06-13 MED ORDER — POTASSIUM BICARB-CITRIC ACID 20 MEQ PO TBEF: 20.0000 meq | EFFERVESCENT_TABLET | Freq: Every day | ORAL | Status: DC

## 2016-06-13 MED ORDER — FAMOTIDINE IN NACL 20-0.9 MG/50ML-% IV SOLN
20.0000 mg | Freq: Once | INTRAVENOUS | Status: AC
  Administered 2016-06-13: 20 mg via INTRAVENOUS

## 2016-06-13 MED ORDER — HEPARIN SOD (PORK) LOCK FLUSH 100 UNIT/ML IV SOLN
INTRAVENOUS | Status: AC
  Filled 2016-06-13: qty 5

## 2016-06-13 MED ORDER — SODIUM CHLORIDE 0.9 % IV SOLN
Freq: Once | INTRAVENOUS | Status: AC
  Administered 2016-06-13: 10:00:00 via INTRAVENOUS

## 2016-06-13 MED ORDER — RADIAPLEXRX EX GEL
Freq: Once | CUTANEOUS | Status: AC
  Administered 2016-06-13: 15:00:00 via TOPICAL

## 2016-06-13 MED ORDER — PALONOSETRON HCL INJECTION 0.25 MG/5ML
INTRAVENOUS | Status: AC
  Filled 2016-06-13: qty 5

## 2016-06-13 NOTE — Patient Instructions (Signed)
[place] at [facility] Discharge Instructions  RECOMMENDATIONS MADE BY THE CONSULTANT AND ANY TEST RESULTS WILL BE SENT TO YOUR REFERRING PHYSICIAN.  You were seen by Dr. Whitney Muse today. She is ordering Home Health referral with [place] to assess and assist with your wound care. Keep the wound dressings in place and change if soiled. Home health nursing will assist you with dressing supplies and care. They will call you to arrange times to come out and visit. Follow-up next week as scheduled. Lab work next week with appointment. Call the clinic with any questions or concerns.   Thank you for choosing [place] at [facility] to provide your oncology and hematology care.  To afford each patient quality time with our provider, please arrive at least 15 minutes before your scheduled appointment time.   Beginning January 23rd 2017 lab work for the [facility] will be done in the  Main lab at [facility] on 1st floor. If you have a lab appointment with the [facility] please come in thru the  Main Entrance and check in at the main information desk  You need to re-schedule your appointment should you arrive 10 or more minutes late.  We strive to give you quality time with our providers, and arriving late affects you and other patients whose appointments are after yours.  Also, if you no show three or more times for appointments you may be dismissed from the clinic at the providers discretion.     Again, thank you for choosing [facility].  Our hope is that these requests will decrease the amount of time that you wait before being seen by our physicians.       _____________________________________________________________  Should you have questions after your visit to [facility], please contact our office at [phone] between the hours of 8:30 a.m. and 4:30 p.m.  Voicemails left after 4:30 p.m. will not be  returned until the following business day.  For prescription refill requests, have your pharmacy contact our office.         Resources For Cancer Patients and their Caregivers ? American Cancer Society: Can assist with transportation, wigs, general needs, runs Look Good Feel Better.        904-859-7818 ? Cancer Care: Provides financial assistance, online support groups, medication/co-pay assistance.  1-800-813-HOPE 587-812-1816) ? [place] Assists [place] cancer patients and their families through emotional , educational and financial support.  (719) 747-8976 ? [place] DSS Where to apply for food stamps, Medicaid and utility assistance. 5397179332 ? [facility]: Transportation to medical appointments. 5613399033 ? Social Security Administration: May apply for disability if have a Stage IV cancer. 802-225-8492 606-730-0261 ? [employer], Disability and Transit Services: Assists with nutrition, care and transit needs. [place] Support Programs: '@10RELATIVEDAYS'$ @ > Cancer Support Group  2nd Tuesday of the month 1pm-2pm, Journey Room  > Creative Journey  3rd Tuesday of the month 1130am-1pm, Journey Room  > Look Good Feel Better  1st Wednesday of the month 10am-12 noon, Journey Room (Call [place] to register [phone])

## 2016-06-13 NOTE — Progress Notes (Signed)
Weight and vitals stable. Reports right shoulder pain 3 on a scale of 0-10 that no longer radiates to his hand. Continues Fentanyl 50 and neurontin. Reports cough has resolved. Reports discomfort with swallowing continues but, he is using carafate as directed. Hoarseness continues. Denies skin changes within treatment field. Reports using radiaplex as directed. Reports SOB with exertion. Reports fatigue. Reports Dr. Whitney Muse is managing his sacral decubitus and constipation.   BP 114/72 mmHg  Pulse 98  Resp 18  Wt 210 lb (95.255 kg)  SpO2 92% Wt Readings from Last 3 Encounters:  06/13/16 210 lb (95.255 kg)  06/13/16 210 lb (95.255 kg)  06/12/16 215 lb (97.523 kg)

## 2016-06-13 NOTE — Progress Notes (Signed)
George Pollard presented for PICC line dressing change.  See IV assessment in docflowsheets for PICC details.  Proper placement of PICC confirmed by CXR.  PICC located right arm.  Good blood return present. PICC flushed with 48m NS and 250U Heparin, see MAR for further details.  [name] tolerated procedure well and without incident.     VMarin RobertsTolerated chemotherapy well today Discharged via wheelchair

## 2016-06-13 NOTE — Progress Notes (Signed)
[place]  PROGRESS NOTE  Patient Care Team: Monico Blitz, MD as PCP - General (Internal Medicine)  CHIEF COMPLAINTS:  CT Cervical Spine 04/22/2016 showed a pancoast tumor in the right lung apex invading the mediastinum and the C7, T1 and T2 vertebra with extension into the neural foramina and into the spinal canal.  Consistent Right shoulder pain for the last 6 months    Pancoast tumor of right lung ([place])   04/29/2016 Imaging Pancoast tumor extending from medial RUL into R posterior superior mediastinum. abuts and may invade esoph and post trachea, abuts central R subclavian artery, erosion of the R anterior aspects of the C7, T1 and T2 vertebra   05/03/2016 Pathology Results Not diagnostic   05/03/2016 Procedure Video bronchoscopy, Transbronchial ultrasound guided biopsy of 4R LN, mediastinoscopy with direct biopsy of 2R LN by Dr. Prescott Gum.   05/10/2016 PET scan intensely hypermetabolic RUL mass invading mediastinum c/w pancoast tumor. 2 LUL pulmonary nodules. Bilateral adrenal adenomas   05/17/2016 Procedure CT guided biopsy of right pancoast tumor by IR   05/20/2016 Pathology Results Lung, needle/core biopsy(ies), Right Upper Lobe SQUAMOUS CELL CARCINOMA   05/23/2016 -  Radiation Therapy XRT Dr. Tyler Pita.  Targeted at pancoast tumor and two LUL pulmonary nodules.   05/23/2016 -  Chemotherapy Weekly Carboplatin/Paclitaxel    HISTORY OF PRESENTING ILLNESS:  George Pollard 82 y.o. male is here for ongoing follow-up of R pancoast tumor, squamous cell histology PET also suggested 2 LUL pulmonary nodules.  He is receiving XRT to the pancoast tumor and the 2 LUL lung nodules, he is receiving concurrent carbo/taxol.  George Pollard returns to the [facility] today accompanied by his wife and his daughter. He continues to speak in a whisper, and is here today with a cane and a wheelchair. In spite of his recent hospital visit, both George Pollard and his family are in good spirits  today. They are starting his fourth treatment.  He says that his pain has been a lot better. "I can tell a lot of difference."  He notes that his bowels have been off and on. They say he's having a lot of loose stools. They are making shakes with boost, yogurt, sugar-free ice cream, etc. His wife and daughter say he's getting scrambled eggs, boiled eggs, high protein boosts; they say he's eating soft foods, because his mouth has been sore. He notes that his gums will be sore, feeling sore as opposed to having sores. He uses a rinse to take care of this.  When discussing his stools, his wife says he's not done senokot. He notes that he has definitely noticed his problems leaking stool, and that usually when he feels he needs to pass gas he ends up passing stool. His wife says that his bowels have gotten so bad that she washed clothes four times on Saturday. He says that right now his stool is medium soft, which is an improvement over the liquidy stools he's been having.  Regarding his swelling, Dr. Tammi Klippel told them to double up on his lasix recently. He was advised to keep taking his lasix. He would like to take liquid potassium.  During the physical exam, he denies feeling numb in his left hand, though confirms numbness in his right hand. He feels that the neuropathy in his legs has not gotten worse in recent weeks, saying "in fact I think it's gotten better." He denies any chest pain and denies any SOB out of the  ordinary. He denies any pain when his abdomen is palpated.  Today, he confirms that he feels able to do his chemotherapy. His wife confirms that his nausea pill helps when he feels sick after chemo. He notes now that the pain is limited to his right shoulder, saying "it's probably half as bad as it was." He is able to close his hand and grip a bit better than before.  He says that his butt is really sore because he's sitting all the time. When asked if he has a pressure sore, he says he  thinks he does. His family members confirm this.   MEDICAL HISTORY:  Past Medical History  Diagnosis Date  . CAD (coronary artery disease)     a. CABG 1997. b. NSTEMI (troponin >20) s/p PTCA/DES to LCx 10/21/12, overlapping DES to mid & distal RCA 10/26/12; c. 02/2016 MV: EF 37%, large fixed defect - apex, inf, septal, lat wall, no ischemia.  . Atrial fibrillation ([place])   . Hypertension   . BPH (benign prostatic hyperplasia)   . PVD (peripheral vascular disease) ([place])     a. AAA repair 2001.  Marland Kitchen Systolic CHF ([place])     a. 10/2012: acute on chronic systolic CHF / pulmonary edema secondary to severe mixed cardiomyopathy;  b. 02/2016 Echo: EF 30-35%, sev dil RV, RV dysfxn, massively dil RA, PASP 36mHg.  . Diabetes mellitus ([place]     a. Noted 10/2012 (prior hx of DM resolved with weight loss).  . Ventricular tachycardia ([place]     a. 02/2016 4 ICD shocks for VT-->Amio added.  .Marland KitchenAICD (automatic cardioverter/defibrillator) present   . Shortness of breath dyspnea   . Anxiety   . Lung cancer ([place]     pancoast tumor right lung apex invading mediastinum and C7, T1, T2 with extension into the neural foramina and into the spinal canal  . [place] tumor of right lung [place] 05/17/2016  . Cardiomyopathy ([facility]   . H/O echocardiogram 02/2016    EF 30-35%  . Shoulder pain     SURGICAL HISTORY: Past Surgical History  Procedure Laterality Date  . Coronary artery bypass graft      1997  . Abdominal aortic aneurysm repair      08/26/2000  . Cataract extraction    . Tonsillectomy    . Left heart catheterization with coronary/graft angiogram N/A 10/21/2012    Procedure: LEFT HEART CATHETERIZATION WITH CBeatrix Fetters  Surgeon: CBurnell Blanks MD;  [facility];  Service: Cardiovascular;  Laterality: N/A;  . Percutaneous coronary stent intervention (pci-s) N/A 10/26/2012    Procedure: PERCUTANEOUS CORONARY STENT INTERVENTION (PCI-S);  Surgeon: MSherren Mocha MD;  [facility];  Service: Cardiovascular;  Laterality: N/A;  . Cardiac catheterization    . Mediastinoscopy N/A 05/03/2016    Procedure: MEDIASTINOSCOPY;  Surgeon: PIvin Poot MD;  [facility]: [facility]  Service: Thoracic;  Laterality: N/A;  . Video bronchoscopy with endobronchial ultrasound N/A 05/03/2016    Procedure: VIDEO BRONCHOSCOPY WITH ENDOBRONCHIAL ULTRASOUND;  Surgeon: PIvin Poot MD;  [facility];  Service: Thoracic;  Laterality: N/A;    SOCIAL HISTORY: Social History   Social History  . Marital Status: Married    Spouse Name: N/A  . Number of Children: 4  . Years of Education: N/A   Occupational History  . Not on file.   Social History Main Topics  . Smoking status: Former Smoker -- 2.00 packs/day for 20 years    Types:  Cigarettes    Quit date: 12/17/1995  . Smokeless tobacco: Never Used     Comment: Quit 1997  . Alcohol Use: No  . Drug Use: No  . Sexual Activity: No   Other Topics Concern  . Not on file   Social History Narrative   Lives in [place].  Lives with wife.     Married 91 years 4 children 8 grandchildren 62 great-grandchildren Ex smoker, quit in 1997. He was a heavy smoker, at least 2 ppd. Started at 26-16 yo. Worked most years at a [employer]. 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  He does carpentry as a hobby.   FAMILY HISTORY: Family History  Problem Relation Age of Onset  . Valvular heart disease Daughter     MVP repair   Father died at 64 yo. Received artifical knee replacement and fell after which broke it up. He had it replaced then fell again. At this point he just sat and gained weight. Alzheimer's. Had an aneurysm. Mother died at 93 yo. She had a heart attack in her 27s. Congestive heart failure. 2 brothers and 2 sisters total siblings. All 4 siblings have had aneurysms. 3 had surgeries, and 1 was too unhealthy for surgery. All were smokers.  2 brothers are still living.  ALLERGIES:   has No Known Allergies.  MEDICATIONS:  Current Outpatient Prescriptions  Medication Sig Dispense Refill  . amiodarone (PACERONE) 200 MG tablet Take 1 tablet by mouth 2 (two) times daily.  0  . aspirin EC 81 MG tablet Take 81 mg by mouth daily.    Marland Kitchen atorvastatin (LIPITOR) 80 MG tablet take 1 tablet by mouth once daily 30 tablet 2  . CARBOPLATIN IV Inject into the vein. To be given weekly with radiation    . fentaNYL (DURAGESIC - DOSED MCG/HR) 50 MCG/HR Place 1 patch (50 mcg total) onto the skin every 3 (three) days. 10 patch 0  . furosemide (LASIX) 40 MG tablet Take 1 tablet (40 mg total) by mouth 2 (two) times daily. (Patient taking differently: Take 80 mg by mouth 2 (two) times daily. ) 180 tablet 3  . gabapentin (NEURONTIN) 300 MG capsule Take 1 capsule (300 mg total) by mouth at bedtime. 60 capsule 1  . HYDROcodone-acetaminophen (NORCO) 10-325 MG tablet Take 1 tablet by mouth every 4 (four) hours as needed. (Patient taking differently: Take 1 tablet by mouth every 4 (four) hours as needed for moderate pain. ) 90 tablet 0  . lisinopril (PRINIVIL,ZESTRIL) 2.5 MG tablet take 1 tablet by mouth once daily 30 tablet 2  . metFORMIN (GLUCOPHAGE) 500 MG tablet Take 500 mg by mouth daily.   0  . Metoprolol Tartrate (LOPRESSOR) 50 MG tablet Take 1.5 tablets (75 mg total) by mouth 2 (two) times daily. 90 tablet 6  . nitroGLYCERIN (NITROSTAT) 0.4 MG SL tablet Place 1 tablet (0.4 mg total) under the tongue every 5 (five) minutes as needed for chest pain (up to 3 doses). 25 tablet 2  . ondansetron (ZOFRAN) 8 MG tablet Take 1 tablet (8 mg total) by mouth every 8 (eight) hours as needed for nausea or vomiting. 30 tablet 2  . potassium chloride SA (K-DUR,KLOR-CON) 20 MEQ tablet Take 1 tablet by mouth daily.  0  . prochlorperazine (COMPAZINE) 10 MG tablet Take 1 tablet (10 mg total) by mouth every 6 (six) hours as needed for nausea or vomiting. 30 tablet 2  . Sodium Chloride  Flush (NORMAL SALINE FLUSH) 0.9 %  SOLN Flush PICC line twice a week with 18m prior to flushing with Heparin. 16 Syringe 0  . sucralfate (CARAFATE) 1 g tablet Take 1 tablet (1 g total) by mouth 4 (four) times daily -  with meals and at bedtime. 5 min before meals for radiation induced esophagitis 120 tablet 2  . warfarin (COUMADIN) 5 MG tablet Take 1 tablet (5 mg total) by mouth daily at 6 PM. 30 tablet 0  . Heparin Lock Flush (HEPARIN FLUSH, PORCINE,) 100 UNIT/ML injection Flush PICC line twice a week. First flush with Saline. Then Flush PICC line with 2.538mof Heparin. 16 Syringe 0  . oxyCODONE (OXY IR/ROXICODONE) 5 MG immediate release tablet Reported on 06/13/2016  0  . PACLitaxel (TAXOL IV) Inject into the vein. To be given weekly with radiation    . warfarin (COUMADIN) 10 MG tablet Take 5 mg by mouth every evening. Reported on 06/13/2016     No current facility-administered medications for this visit.    Review of Systems  Constitutional: Positive for weight loss (5 lbs weight loss since 6/2) and malaise/fatigue.       Decreased appetite  HENT: Positive for sore throat.   Eyes: Negative.   Respiratory: Negative.   Cardiovascular: Positive for leg swelling.       Occasional left leg swelling, managed with compression socks.  Gastrointestinal: Negative.   Genitourinary: Negative.   Musculoskeletal:       Consistent Right shoulder pain.  Skin: Negative.   Neurological: Positive for speech change, focal weakness and weakness.       Right arm and bilateral leg weakness. Hoarse voice  Endo/Heme/Allergies: Negative.   Psychiatric/Behavioral: Negative.   All other systems reviewed and are negative. 14 point ROS was done and is otherwise as detailed above or in HPI    PHYSICAL EXAMINATION: ECOG PERFORMANCE STATUS: 2 - Symptomatic, <50% confined to bed  Filed Vitals:   06/13/16 0835  BP: 101/58  Pulse: 87  Temp: 97.6 F (36.4 C)  Resp: 18   Filed Weights   06/13/16 0835  Weight: 210 lb (95.255 kg)     Physical Exam  Constitutional: He is oriented to person, place, and time and well-developed, well-nourished, and in no distress.  HENT:  Head: Normocephalic and atraumatic.  Mouth/Throat: Oropharynx is clear and moist.  Eyes: Conjunctivae and EOM are normal. Pupils are equal, round, and reactive to light. Right eye exhibits no discharge. Left eye exhibits no discharge. No scleral icterus.  Neck: Normal range of motion. Neck supple. No JVD present. No tracheal deviation present. No thyromegaly present.  Cardiovascular: Normal rate, regular rhythm and normal heart sounds.   Pulmonary/Chest: Effort normal and breath sounds normal. No respiratory distress. He has no wheezes.  Abdominal: Soft. Bowel sounds are normal. He exhibits no distension and no mass. There is no tenderness. There is no rebound and no guarding.  Musculoskeletal: Normal range of motion. He exhibits no edema.  Compression hose LE bilaterally, muscle atrophy R scapular region  Lymphadenopathy:    He has no cervical adenopathy.  Neurological: He is alert and oriented to person, place, and time. No cranial nerve deficit.  Right UE weakness. Weak hip flexors. Cannot stand without pushing up on sides of chair with arms, difficulty lifting legs from hips  Skin: Skin is warm and dry. No erythema.  Psychiatric: Mood, memory, affect and judgment normal.  Nursing note and vitals reviewed.   LABORATORY DATA:  I have  reviewed the data as listed Lab Results  Component Value Date   WBC 4.8 06/12/2016   HGB 10.0* 06/12/2016   HCT 30.9* 06/12/2016   MCV 90.6 06/12/2016   PLT 243 06/12/2016   CMP     Component Value Date/Time   NA 137 06/12/2016 1845   K 3.5 06/12/2016 1845   CL 100* 06/12/2016 1845   CO2 27 06/12/2016 1845   GLUCOSE 176* 06/12/2016 1845   BUN 23* 06/12/2016 1845   CREATININE 0.98 06/12/2016 1845   CALCIUM 8.7* 06/12/2016 1845   PROT 6.0* 06/12/2016 1845   ALBUMIN 2.7* 06/12/2016 1845   AST 26  06/12/2016 1845   ALT 35 06/12/2016 1845   ALKPHOS 65 06/12/2016 1845   BILITOT 1.1 06/12/2016 1845   GFRNONAA >60 06/12/2016 1845   GFRAA >60 06/12/2016 1845    Results for George Pollard, George Pollard (MRN [id number]) as of 06/13/2016 08:42  Ref. Range 06/12/2016 20:30  Appearance Latest Ref Range: CLEAR  CLEAR  Bilirubin Urine Latest Ref Range: NEGATIVE  NEGATIVE  Color, Urine Latest Ref Range: YELLOW  YELLOW  Glucose Latest Ref Range: NEGATIVE mg/dL 100 (A)  Hgb urine dipstick Latest Ref Range: NEGATIVE  NEGATIVE  Ketones, ur Latest Ref Range: NEGATIVE mg/dL NEGATIVE  Leukocytes, UA Latest Ref Range: NEGATIVE  NEGATIVE  Nitrite Latest Ref Range: NEGATIVE  NEGATIVE  pH Latest Ref Range: 5.0-8.0  6.0  Protein Latest Ref Range: NEGATIVE mg/dL NEGATIVE  Specific Gravity, Urine Latest Ref Range: 1.005-1.030  1.015    RADIOGRAPHIC STUDIES: I have personally reviewed the radiological images as listed and agreed with the findings in the report. Dg Chest Portable 1 View  06/12/2016  CLINICAL DATA:  Bilateral lower extremity swelling, abdominal swelling and fatigue. History of lung cancer. EXAM: PORTABLE CHEST 1 VIEW COMPARISON:  05/20/2016. FINDINGS: Stable mild cardiac enlargement. Stable tortuosity and calcification of the thoracic aorta. Stable surgical changes from bypass surgery. The right ventricular pacer wire and right PICC line are stable. Stable right paratracheal soft tissue density. No acute overlying pulmonary process. No pleural effusions. The bony thorax is intact. IMPRESSION: No acute cardiopulmonary findings. Electronically Signed   By: Marijo Sanes M.D.   On: 06/12/2016 18:15   Dg Abd 2 Views  06/12/2016  CLINICAL DATA:  82 year old male with constipation and bilateral leg swelling. Abdominal pain. EXAM: ABDOMEN - 2 VIEW COMPARISON:  CT of the abdomen pelvis dated 04/29/2016 FINDINGS: Copious amount of dense stool noted throughout the colon. There is no bowel dilatation or evidence of  obstruction. No free air. No radiopaque calculi. There is advanced osteopenia with extensive degenerative changes of the spine and scoliosis. No acute fracture. Multiple surgical clips noted in the upper abdomen. Bibasilar linear atelectasis/ scarring. A small right pleural effusion noted. There is median sternotomy wires and CABG vascular clips. Left pectoral AICD device. IMPRESSION: Constipation.  No bowel obstruction. Electronically Signed   By: Anner Crete M.D.   On: 06/12/2016 19:57    PATHOLOGY   ASSESSMENT & PLAN:  R Pancoast Tumor CT Cervical Spine 04/22/2016 showed a pancoast tumor in the right lung apex invading the mediastinum and the C7, T1 and T2 vertebra with extension into the neural foramina and into the spinal canal.  Consistent Right shoulder pain for the last 6 months R arm/hand weakness History of tobacco use Hip flexor weakness Cancer related pain Anemia 2 small Sacral Decubitus stage I to stage II Constipation/obstipation Edema  He will continue with cycle #4 of weekly  carboplatin/taxol. He will continue with XRT. Pain is finally improved.  Abdominal plain films were obtained in the ED last evening that showed copious amounts of dense stool throughout the abdomen. We readdressed adequate management of his constipation. I advised the patient and his wife to read through our constipation sheet, he needs a daily regimen. Either Miralax daily, MOM daily. He will definitely need a stimulant laxative.  I advise him that currently he is consistently "leaking stool around impacted stool burden." He was given a stool regimen to clean out this weekend.   A prescription was called in for Effer K.  I'll take over his INR until he's done with chemo; Dr. Brigitte Pulse can take back over after his chemo is done. We will adjust it and tell him when to restart, etc.  He takes 1.5 metoprolol twice a day. We don't really want to mess with his heart medicines. If his blood pressure gets really  low, we may need to back off on his lasix.  He has not had therapy yet for his legs. We will send him to speech therapy and therapy for his legs, arm, and hand strength, but we won't worry about this until he's done with treatment. They will have a hard time coordinating with PT while they are getting XRT.   Today we graded two pressure sores on his rear end, top and bottom. One is Stage 2 at least. He will need a home health consult. WE have referred him to home health and in addition dressed the areas today. We will continue with weekly checks.   Orders Placed This Encounter  Procedures  . Ambulatory referral to Home Health    Referral Priority:  Routine    Referral Type:  Home Health Care    Referral Reason:  Specialty Services Required    Requested Specialty:  [place]    Number of Visits Requested:  1    All questions were answered. The patient knows to call the clinic with any problems, questions or concerns.  This document serves as a record of services personally performed by Ancil Linsey, MD. It was created on her behalf by Toni Amend, a trained medical scribe. The creation of this record is based on the scribe's personal observations and the provider's statements to them. This document has been checked and approved by the attending provider.  I have reviewed the above documentation for accuracy and completeness, and I agree with the above.  This note was electronically signed.    Molli Hazard, MD  06/13/2016 9:19 AM

## 2016-06-13 NOTE — Progress Notes (Signed)
  Radiation Oncology         [id number]   Name: George Pollard MRN: [id number]   Date: 06/13/2016  DOB: 1933-03-31   Weekly Radiation Therapy Management  Pancoast tumor of right lung (HCC)   Current Dose: 30 Gy  Planned Dose:  66 Gy  Narrative The patient presents for routine under treatment assessment. Reports right shoulder pain as a 6/10. It is much better controlled with Fentanyl 72mg and neurontin. Reports an occasional dry cough. Reports occasional discomfort with swallowing. He has MVeterinary surgeon Hoarseness continues. Denies skin changes within the treatment field. Reports SOB with exertion. Report fatigue. He had constipation, but it is better now. Set-up films were reviewed. The chart was checked.  Physical Findings  weight is 210 lb (95.255 kg). His blood pressure is 114/72 and his pulse is 98. His respiration is 18 and oxygen saturation is 92%. . Weight and vitals stable. Reports right shoulder pain 3 on a scale of 0-10 that no longer radiates to his hand. Continues Fentanyl 50 and neurontin. Reports cough has resolved. Reports discomfort with swallowing continues but, he is using carafate as directed. Hoarseness continues. Denies skin changes within treatment field. Reports using radiaplex as directed. Reports SOB with exertion. Reports fatigue. Reports Dr. PWhitney Museis managing his sacral decubitus and constipation   Impression The patient is tolerating radiation.  Plan Continue treatment as planned. The patient was prescribed Carafate to help with swallowing food. The patient should continue Lasix, this weekend to get his swelling down.         MSheral ApleyMTammi Klippel M.D.  This document serves as a record of services personally performed by MTyler Pita MD. It was created on his behalf by TTruddie Hidden a trained medical scribe. The creation of this record is based on the scribe's personal observations and the provider's statements to them. This document has been  checked and approved by the attending provider.

## 2016-06-13 NOTE — Patient Instructions (Signed)
[facility] Discharge Instructions for Patients Receiving Chemotherapy   Beginning January 23rd 2017 lab work for the [facility] will be done in the  Main lab at [facility] on 1st floor. If you have a lab appointment with the [facility] please come in thru the  Main Entrance and check in at the main information desk   Today you received the following chemotherapy agents taxol and carbo Follow up as scheduled Please call the clinic if you have any questions or concerns   To help prevent nausea and vomiting after your treatment, we encourage you to take your nausea medication   If you develop nausea and vomiting, or diarrhea that is not controlled by your medication, call the clinic.  The clinic phone number is [phone]. Office hours are Monday-Friday 8:30am-5:00pm.  BELOW ARE SYMPTOMS THAT SHOULD BE REPORTED IMMEDIATELY:  *FEVER GREATER THAN 101.0 F  *CHILLS WITH OR WITHOUT FEVER  NAUSEA AND VOMITING THAT IS NOT CONTROLLED WITH YOUR NAUSEA MEDICATION  *UNUSUAL SHORTNESS OF BREATH  *UNUSUAL BRUISING OR BLEEDING  TENDERNESS IN MOUTH AND THROAT WITH OR WITHOUT PRESENCE OF ULCERS  *URINARY PROBLEMS  *BOWEL PROBLEMS  UNUSUAL RASH Items with * indicate a potential emergency and should be followed up as soon as possible. If you have an emergency after office hours please contact your primary care physician or go to the nearest emergency department.  Please call the clinic during office hours if you have any questions or concerns.   You may also contact the Patient Navigator at [phone] should you have any questions or need assistance in obtaining follow up care.      Resources For Cancer Patients and their Caregivers ? American Cancer Society: Can assist with transportation, wigs, general needs, runs Look Good Feel Better.        850-392-3086 ? Cancer Care: Provides financial assistance, online support groups, medication/co-pay  assistance.  1-800-813-HOPE 262-094-0694) ? [facility] Assists [place] cancer patients and their families through emotional , educational and financial support.  2404229127 ? [place] DSS Where to apply for food stamps, Medicaid and utility assistance. (332)568-9627 ? [facility]: Transportation to medical appointments. (303) 650-9448 ? Social Security Administration: May apply for disability if have a Stage IV cancer. (971)309-1477 (807)801-9648 ? [employer], Disability and Transit Services: Assists with nutrition, care and transit needs. 938-674-2685

## 2016-06-14 ENCOUNTER — Ambulatory Visit
Admission: RE | Admit: 2016-06-14 | Discharge: 2016-06-14 | Disposition: A | Discharge status: Home / Self Care | Payer: Medicare Other | Source: Ambulatory Visit | Attending: Radiation Oncology | Admitting: Radiation Oncology

## 2016-06-14 DIAGNOSIS — Z510 Encounter for antineoplastic radiation therapy: Secondary | ICD-10-CM | POA: Diagnosis not present

## 2016-06-17 ENCOUNTER — Other Ambulatory Visit (HOSPITAL_COMMUNITY): Payer: Self-pay | Admitting: Oncology

## 2016-06-17 ENCOUNTER — Encounter (HOSPITAL_COMMUNITY): Payer: Self-pay | Admitting: *Deleted

## 2016-06-17 ENCOUNTER — Ambulatory Visit
Admission: RE | Admit: 2016-06-17 | Discharge: 2016-06-17 | Disposition: A | Discharge status: Home / Self Care | Payer: Medicare Other | Source: Ambulatory Visit | Attending: Radiation Oncology | Admitting: Radiation Oncology

## 2016-06-17 ENCOUNTER — Encounter (HOSPITAL_COMMUNITY): Payer: Medicare Other | Attending: Hematology & Oncology

## 2016-06-17 DIAGNOSIS — G63 Polyneuropathy in diseases classified elsewhere: Secondary | ICD-10-CM | POA: Insufficient documentation

## 2016-06-17 DIAGNOSIS — L8993 Pressure ulcer of unspecified site, stage 3: Secondary | ICD-10-CM | POA: Insufficient documentation

## 2016-06-17 DIAGNOSIS — R918 Other nonspecific abnormal finding of lung field: Secondary | ICD-10-CM | POA: Insufficient documentation

## 2016-06-17 DIAGNOSIS — C801 Malignant (primary) neoplasm, unspecified: Secondary | ICD-10-CM | POA: Diagnosis present

## 2016-06-17 DIAGNOSIS — I255 Ischemic cardiomyopathy: Secondary | ICD-10-CM

## 2016-06-17 DIAGNOSIS — C3411 Malignant neoplasm of upper lobe, right bronchus or lung: Secondary | ICD-10-CM | POA: Diagnosis present

## 2016-06-17 DIAGNOSIS — Z452 Encounter for adjustment and management of vascular access device: Secondary | ICD-10-CM | POA: Diagnosis present

## 2016-06-17 DIAGNOSIS — Z510 Encounter for antineoplastic radiation therapy: Secondary | ICD-10-CM | POA: Diagnosis not present

## 2016-06-17 LAB — PROTIME-INR
INR: 2.39 — AB (ref 0.00–1.49)
PROTHROMBIN TIME: 25.8 s — AB (ref 11.6–15.2)

## 2016-06-19 ENCOUNTER — Ambulatory Visit
Admission: RE | Admit: 2016-06-19 | Discharge: 2016-06-19 | Disposition: A | Discharge status: Home / Self Care | Payer: Medicare Other | Source: Ambulatory Visit | Attending: Radiation Oncology | Admitting: Radiation Oncology

## 2016-06-19 DIAGNOSIS — Z510 Encounter for antineoplastic radiation therapy: Secondary | ICD-10-CM | POA: Diagnosis not present

## 2016-06-20 ENCOUNTER — Inpatient Hospital Stay (HOSPITAL_COMMUNITY): Payer: Medicare Other

## 2016-06-20 ENCOUNTER — Encounter (HOSPITAL_BASED_OUTPATIENT_CLINIC_OR_DEPARTMENT_OTHER): Payer: Medicare Other

## 2016-06-20 ENCOUNTER — Encounter (HOSPITAL_BASED_OUTPATIENT_CLINIC_OR_DEPARTMENT_OTHER): Payer: Medicare Other | Admitting: Hematology & Oncology

## 2016-06-20 ENCOUNTER — Ambulatory Visit (HOSPITAL_COMMUNITY): Payer: Medicare Other | Admitting: Hematology & Oncology

## 2016-06-20 ENCOUNTER — Ambulatory Visit
Admission: RE | Admit: 2016-06-20 | Discharge: 2016-06-20 | Disposition: A | Discharge status: Home / Self Care | Payer: Medicare Other | Source: Ambulatory Visit | Attending: Radiation Oncology | Admitting: Radiation Oncology

## 2016-06-20 ENCOUNTER — Encounter (HOSPITAL_COMMUNITY): Payer: Self-pay | Admitting: Hematology & Oncology

## 2016-06-20 VITALS — BP 100/51 | HR 90 | Temp 97.9°F | Resp 18

## 2016-06-20 VITALS — BP 93/52 | HR 79 | Temp 98.1°F | Resp 18 | Wt 206.0 lb

## 2016-06-20 DIAGNOSIS — Z5111 Encounter for antineoplastic chemotherapy: Secondary | ICD-10-CM

## 2016-06-20 DIAGNOSIS — R29898 Other symptoms and signs involving the musculoskeletal system: Secondary | ICD-10-CM

## 2016-06-20 DIAGNOSIS — C3411 Malignant neoplasm of upper lobe, right bronchus or lung: Secondary | ICD-10-CM

## 2016-06-20 DIAGNOSIS — G893 Neoplasm related pain (acute) (chronic): Secondary | ICD-10-CM

## 2016-06-20 DIAGNOSIS — D649 Anemia, unspecified: Secondary | ICD-10-CM | POA: Diagnosis not present

## 2016-06-20 DIAGNOSIS — Z510 Encounter for antineoplastic radiation therapy: Secondary | ICD-10-CM | POA: Diagnosis not present

## 2016-06-20 DIAGNOSIS — L89322 Pressure ulcer of left buttock, stage 2: Secondary | ICD-10-CM

## 2016-06-20 LAB — CBC WITH DIFFERENTIAL/PLATELET
Basophils Absolute: 0 10*3/uL (ref 0.0–0.1)
Basophils Relative: 0 %
EOS PCT: 1 %
Eosinophils Absolute: 0 10*3/uL (ref 0.0–0.7)
HCT: 28.7 % — ABNORMAL LOW (ref 39.0–52.0)
HEMOGLOBIN: 9.3 g/dL — AB (ref 13.0–17.0)
LYMPHS ABS: 0.3 10*3/uL — AB (ref 0.7–4.0)
LYMPHS PCT: 5 %
MCH: 29.5 pg (ref 26.0–34.0)
MCHC: 32.4 g/dL (ref 30.0–36.0)
MCV: 91.1 fL (ref 78.0–100.0)
MONOS PCT: 14 %
Monocytes Absolute: 0.8 10*3/uL (ref 0.1–1.0)
Neutro Abs: 4.6 10*3/uL (ref 1.7–7.7)
Neutrophils Relative %: 80 %
PLATELETS: 174 10*3/uL (ref 150–400)
RBC: 3.15 MIL/uL — AB (ref 4.22–5.81)
RDW: 18 % — ABNORMAL HIGH (ref 11.5–15.5)
WBC: 5.6 10*3/uL (ref 4.0–10.5)

## 2016-06-20 LAB — COMPREHENSIVE METABOLIC PANEL
ALK PHOS: 67 U/L (ref 38–126)
ALT: 40 U/L (ref 17–63)
AST: 28 U/L (ref 15–41)
Albumin: 2.6 g/dL — ABNORMAL LOW (ref 3.5–5.0)
Anion gap: 8 (ref 5–15)
BUN: 17 mg/dL (ref 6–20)
CO2: 28 mmol/L (ref 22–32)
Calcium: 8.4 mg/dL — ABNORMAL LOW (ref 8.9–10.3)
Chloride: 101 mmol/L (ref 101–111)
Creatinine, Ser: 1.08 mg/dL (ref 0.61–1.24)
GLUCOSE: 190 mg/dL — AB (ref 65–99)
Potassium: 3.6 mmol/L (ref 3.5–5.1)
Sodium: 137 mmol/L (ref 135–145)
Total Bilirubin: 1.6 mg/dL — ABNORMAL HIGH (ref 0.3–1.2)
Total Protein: 5.7 g/dL — ABNORMAL LOW (ref 6.5–8.1)

## 2016-06-20 MED ORDER — PACLITAXEL CHEMO INJECTION 300 MG/50ML
45.0000 mg/m2 | Freq: Once | INTRAVENOUS | Status: AC
  Administered 2016-06-20: 96 mg via INTRAVENOUS
  Filled 2016-06-20: qty 16

## 2016-06-20 MED ORDER — FAMOTIDINE IN NACL 20-0.9 MG/50ML-% IV SOLN
20.0000 mg | Freq: Once | INTRAVENOUS | Status: AC
  Administered 2016-06-20: 20 mg via INTRAVENOUS
  Filled 2016-06-20: qty 50

## 2016-06-20 MED ORDER — PALONOSETRON HCL INJECTION 0.25 MG/5ML
0.2500 mg | Freq: Once | INTRAVENOUS | Status: AC
  Administered 2016-06-20: 0.25 mg via INTRAVENOUS
  Filled 2016-06-20: qty 5

## 2016-06-20 MED ORDER — SODIUM CHLORIDE 0.9% FLUSH: 10.0000 mL | INTRAVENOUS | Status: DC | PRN

## 2016-06-20 MED ORDER — HEPARIN SOD (PORK) LOCK FLUSH 100 UNIT/ML IV SOLN
250.0000 [IU] | Freq: Once | INTRAVENOUS | Status: AC | PRN
  Administered 2016-06-20: 250 [IU]
  Filled 2016-06-20 (×2): qty 5

## 2016-06-20 MED ORDER — DIPHENHYDRAMINE HCL 50 MG/ML IJ SOLN
50.0000 mg | Freq: Once | INTRAMUSCULAR | Status: AC
  Administered 2016-06-20: 50 mg via INTRAVENOUS
  Filled 2016-06-20: qty 1

## 2016-06-20 MED ORDER — SODIUM CHLORIDE 0.9 % IV SOLN
198.4000 mg | Freq: Once | INTRAVENOUS | Status: AC
  Administered 2016-06-20: 200 mg via INTRAVENOUS
  Filled 2016-06-20: qty 20

## 2016-06-20 MED ORDER — SODIUM CHLORIDE 0.9 % IV SOLN
Freq: Once | INTRAVENOUS | Status: AC
  Administered 2016-06-20: 10:00:00 via INTRAVENOUS

## 2016-06-20 MED ORDER — SODIUM CHLORIDE 0.9 % IV SOLN
20.0000 mg | Freq: Once | INTRAVENOUS | Status: AC
  Administered 2016-06-20: 20 mg via INTRAVENOUS
  Filled 2016-06-20: qty 2

## 2016-06-20 NOTE — Progress Notes (Signed)
Dr. Whitney Muse aware of total bilirubin 1.6 - okay to treat today per MD.   1030:  Family reports pt "acting funny".  Alert, answers questions appropriately. Pt appears drowsy, but did receive Benadryl 50 mg IV.  In no apparent distress.  VS assessed - BP 84/40 - Dr. Whitney Muse notified.  Will continue to monitor as per MD instructions.  Okay to treat per MD.    6629: PICC dressing change done.  See IV assessment in docflowsheets for PICC details.   PICC located right arm.  Good blood return present. PICC flushed with 15m NS and 250U Heparin, see MAR for further details.  Tolerated procedure well and without incident. Tolerated tx w/o adverse reaction; A&Ox4, in no distress; VSS.  Discharged via wheelchair in c/o family for transport home.

## 2016-06-20 NOTE — Patient Instructions (Signed)
Return in 1 week for labs and chemo.

## 2016-06-20 NOTE — Patient Instructions (Signed)
[facility] Discharge Instructions for Patients Receiving Chemotherapy   Beginning January 23rd 2017 lab work for the Cancer Center will be done in the  Main lab at [facility] on 1st floor. If you have a lab appointment with the Cancer Center please come in thru the  Main Entrance and check in at the main information desk   Today you received the following chemotherapy agents:  Taxol and carboplatin  If you develop nausea and vomiting, or diarrhea that is not controlled by your medication, call the clinic.  The clinic phone number is [phone]. Office hours are Monday-Friday 8:30am-5:00pm.  BELOW ARE SYMPTOMS THAT SHOULD BE REPORTED IMMEDIATELY:  *FEVER GREATER THAN 101.0 F  *CHILLS WITH OR WITHOUT FEVER  NAUSEA AND VOMITING THAT IS NOT CONTROLLED WITH YOUR NAUSEA MEDICATION  *UNUSUAL SHORTNESS OF BREATH  *UNUSUAL BRUISING OR BLEEDING  TENDERNESS IN MOUTH AND THROAT WITH OR WITHOUT PRESENCE OF ULCERS  *URINARY PROBLEMS  *BOWEL PROBLEMS  UNUSUAL RASH Items with * indicate a potential emergency and should be followed up as soon as possible. If you have an emergency after office hours please contact your primary care physician or go to the nearest emergency department.  Please call the clinic during office hours if you have any questions or concerns.   You may also contact the Patient Navigator at [phone] should you have any questions or need assistance in obtaining follow up care.      Resources For Cancer Patients and their Caregivers ? American Cancer Society: Can assist with transportation, wigs, general needs, runs Look Good Feel Better.        1-888-227-6333 ? Cancer Care: Provides financial assistance, online support groups, medication/co-pay assistance.  1-800-813-HOPE (4673) ? [facility] Assists [place] cancer patients and their families through emotional , educational and financial support.   336-427-4357 ? [place] DSS Where to apply for food stamps, Medicaid and utility assistance. 336-342-1394 ? [facility]: Transportation to medical appointments. 336-347-2287 ? Social Security Administration: May apply for disability if have a Stage IV cancer. 336-342-7796 1-800-772-1213 ? [place] Aging, Disability and Transit Services: Assists with nutrition, care and transit needs. 336-349-2343         

## 2016-06-20 NOTE — Progress Notes (Signed)
[facility]  PROGRESS NOTE  Patient Care Team: Monico Blitz, MD as PCP - General (Internal Medicine)  CHIEF COMPLAINTS:  CT Cervical Spine 04/22/2016 showed a pancoast tumor in the right lung apex invading the mediastinum and the C7, T1 and T2 vertebra with extension into the neural foramina and into the spinal canal.  Consistent Right shoulder pain for the last 6 months    Pancoast tumor of right lung ([place])   04/29/2016 Imaging Pancoast tumor extending from medial RUL into R posterior superior mediastinum. abuts and may invade esoph and post trachea, abuts central R subclavian artery, erosion of the R anterior aspects of the C7, T1 and T2 vertebra   05/03/2016 Pathology Results Not diagnostic   05/03/2016 Procedure Video bronchoscopy, Transbronchial ultrasound guided biopsy of 4R LN, mediastinoscopy with direct biopsy of 2R LN by Dr. Prescott Gum.   05/10/2016 PET scan intensely hypermetabolic RUL mass invading mediastinum c/w pancoast tumor. 2 LUL pulmonary nodules. Bilateral adrenal adenomas   05/17/2016 Procedure CT guided biopsy of right pancoast tumor by IR   05/20/2016 Pathology Results Lung, needle/core biopsy(ies), Right Upper Lobe SQUAMOUS CELL CARCINOMA   05/23/2016 -  Radiation Therapy XRT Dr. Tyler Pita.  Targeted at pancoast tumor and two LUL pulmonary nodules.   05/23/2016 -  Chemotherapy Weekly Carboplatin/Paclitaxel    HISTORY OF PRESENTING ILLNESS:  George Pollard 82 y.o. male is here for ongoing follow-up of R pancoast tumor, squamous cell histology PET also suggested 2 LUL pulmonary nodules.  He is receiving XRT to the pancoast tumor and the 2 LUL lung nodules, he is receiving concurrent carbo/taxol.  Mr. Hyndman returns to the [facility] today accompanied by his wife and his daughter.  He has three bed sores, we referred them to home health but they have still not touched base. Mr. Gago says that [facility] came out one time; a "counselor" who also  said they would be there between nine and ten. Unfortunately the nurse was an hour late, and the family had to leave for an appointment. His wife says that [facility] last came on Saturday, and that they "did the clean out" (stool impaction). That morning on Saturday, they "put stuff on his butt." The home health nurse then apparently left one patch. The dressings keep falling off, according to Mr. Valeriano' wife. She is putting triple antibiotic ointment on his bed sores, but his patches will not stay on.  They confirm that he's sitting on pillows and trying to prevent exacerbation of his wounds in that sense.  His arms are bruised from where he's been falling, according to his wife. She later adds that his arms are also heavily bruised from his wife helping him stand, by pulling on his arms.   He says she's going out and sitting outside, "under the shelter" there. His wife wonders if he should be out in the heat at all. They note that he's been falling because his "legs keep giving out." He indicates a significant amount of walking to get to the shelter where he likes to sit, and describes how his legs will often give out after that walk. He says that he goes outside because he feels like sometimes he just needs some fresh air.  Overall, the patient notes that his pain is doing better. His wife confirms that he takes 1 gabapentin a night and 1 of the other pain pills. Today he notes that his pain is down to a three. It's gone  from severe, to a medium, down to a three. In terms of this throat, he says it feels like he's got a lump in it, especially when he swallows. He notes that he has to swallow a few times every time he drinks liquids, to get it down. His wife adds that he's spitting a lot.  To stay on top of his bowels, he remarks that he is not eating much solid food. He takes a lot of liquids, and adds Miralax. He's been to the bathroom twice since the removal of his impaction on Saturday. He is  also eating a lot of fruits.  He will continue with radiation through the 25th.  Their insurance is [employer].  Mr. Fuelling really needs intensive physical therapy but he wants to hold off until he has completed XRT.   My biggest concern is his weakness, getting him physical therapy, and getting his ulcers healed.   MEDICAL HISTORY:  Past Medical History  Diagnosis Date  . CAD (coronary artery disease)     a. CABG 1997. b. NSTEMI (troponin >20) s/p PTCA/DES to LCx 10/21/12, overlapping DES to mid & distal RCA 10/26/12; c. 02/2016 MV: EF 37%, large fixed defect - apex, inf, septal, lat wall, no ischemia.  . Atrial fibrillation ([place])   . Hypertension   . BPH (benign prostatic hyperplasia)   . PVD (peripheral vascular disease) ([place])     a. AAA repair 2001.  Marland Kitchen Systolic CHF ([place])     a. 10/2012: acute on chronic systolic CHF / pulmonary edema secondary to severe mixed cardiomyopathy;  b. 02/2016 Echo: EF 30-35%, sev dil RV, RV dysfxn, massively dil RA, PASP 67mHg.  . Diabetes mellitus ([place]     a. Noted 10/2012 (prior hx of DM resolved with weight loss).  . [place] tachycardia ([place]     a. 02/2016 4 ICD shocks for VT-->Amio added.  .Marland KitchenAICD (automatic cardioverter/defibrillator) present   . Shortness of breath dyspnea   . Anxiety   . Lung cancer ([place]     pancoast tumor right lung apex invading mediastinum and C7, T1, T2 with extension into the neural foramina and into the spinal canal  . Pancoast tumor of right lung ([place] 05/17/2016  . Cardiomyopathy ([place]   . H/O echocardiogram 02/2016    EF 30-35%  . Shoulder pain     SURGICAL HISTORY: Past Surgical History  Procedure Laterality Date  . Coronary artery bypass graft      1997  . Abdominal aortic aneurysm repair      08/26/2000  . Cataract extraction    . Tonsillectomy    . Left heart catheterization with coronary/graft angiogram N/A 10/21/2012    Procedure: LEFT HEART CATHETERIZATION WITH CBeatrix Fetters   Surgeon: CBurnell Blanks MD;  [facility];  Service: Cardiovascular;  Laterality: N/A;  . Percutaneous coronary stent intervention (pci-s) N/A 10/26/2012    Procedure: PERCUTANEOUS CORONARY STENT INTERVENTION (PCI-S);  Surgeon: MSherren Mocha MD;  [facility];  Service: Cardiovascular;  Laterality: N/A;  . Cardiac catheterization    . Mediastinoscopy N/A 05/03/2016    Procedure: MEDIASTINOSCOPY;  Surgeon: PIvin Poot MD;  [facility]  Service: Thoracic;  Laterality: N/A;  . Video bronchoscopy with endobronchial ultrasound N/A 05/03/2016    Procedure: VIDEO BRONCHOSCOPY WITH ENDOBRONCHIAL ULTRASOUND;  Surgeon: PIvin Poot MD;  [facility];  Service: Thoracic;  Laterality: N/A;    SOCIAL HISTORY: Social History   Social History  . Marital  Status: Married    Spouse Name: N/A  . Number of Children: 4  . Years of Education: N/A   Occupational History  . Not on file.   Social History Main Topics  . Smoking status: Former Smoker -- 2.00 packs/day for 20 years    Types: Cigarettes    Quit date: 12/17/1995  . Smokeless tobacco: Never Used     Comment: Quit 1997  . Alcohol Use: No  . Drug Use: No  . Sexual Activity: No   Other Topics Concern  . Not on file   Social History Narrative   Lives in [place].  Lives with wife.     Married 33 years 4 children 8 grandchildren 65 great-grandchildren Ex smoker, quit in 1997. He was a heavy smoker, at least 2 ppd. Started at 33-16 yo. Worked most years at a [employer]. 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  He does carpentry as a hobby.   FAMILY HISTORY: Family History  Problem Relation Age of Onset  . Valvular heart disease Daughter     MVP repair   Father died at 44 yo. Received artifical knee replacement and fell after which broke it up. He had it replaced then fell again. At this point he just sat and gained weight. Alzheimer's. Had an  aneurysm. Mother died at 3 yo. She had a heart attack in her 73s. Congestive heart failure. 2 brothers and 2 sisters total siblings. All 4 siblings have had aneurysms. 3 had surgeries, and 1 was too unhealthy for surgery. All were smokers.  2 brothers are still living.  ALLERGIES:  has No Known Allergies.  MEDICATIONS:  Current Outpatient Prescriptions  Medication Sig Dispense Refill  . amiodarone (PACERONE) 200 MG tablet Take 1 tablet by mouth 2 (two) times daily.  0  . aspirin EC 81 MG tablet Take 81 mg by mouth daily.    Marland Kitchen atorvastatin (LIPITOR) 80 MG tablet take 1 tablet by mouth once daily 30 tablet 2  . CARBOPLATIN IV Inject into the vein. To be given weekly with radiation    . fentaNYL (DURAGESIC - DOSED MCG/HR) 50 MCG/HR Place 1 patch (50 mcg total) onto the skin every 3 (three) days. 10 patch 0  . furosemide (LASIX) 40 MG tablet Take 1 tablet (40 mg total) by mouth 2 (two) times daily. (Patient taking differently: Take 80 mg by mouth 2 (two) times daily. ) 180 tablet 3  . gabapentin (NEURONTIN) 300 MG capsule Take 1 capsule (300 mg total) by mouth at bedtime. 60 capsule 1  . Heparin Lock Flush (HEPARIN FLUSH, PORCINE,) 100 UNIT/ML injection Flush PICC line twice a week. First flush with Saline. Then Flush PICC line with 2.56m of Heparin. 16 Syringe 0  . HYDROcodone-acetaminophen (NORCO) 10-325 MG tablet Take 1 tablet by mouth every 4 (four) hours as needed. 90 tablet 0  . lisinopril (PRINIVIL,ZESTRIL) 2.5 MG tablet take 1 tablet by mouth once daily 30 tablet 2  . metFORMIN (GLUCOPHAGE) 500 MG tablet Take 500 mg by mouth daily.   0  . Metoprolol Tartrate (LOPRESSOR) 50 MG tablet Take 1.5 tablets (75 mg total) by mouth 2 (two) times daily. 90 tablet 6  . nitroGLYCERIN (NITROSTAT) 0.4 MG SL tablet Place 1 tablet (0.4 mg total) under the tongue every 5 (five) minutes as needed for chest pain (up to 3 doses). 25 tablet 2  . ondansetron (ZOFRAN) 8 MG tablet Take 1 tablet (8 mg  total) by  mouth every 8 (eight) hours as needed for nausea or vomiting. 30 tablet 2  . PACLitaxel (TAXOL IV) Inject into the vein. To be given weekly with radiation    . Potassium Bicarb-Citric Acid 20 MEQ TBEF Take 1 tablet (20 mEq total) by mouth daily. 30 each 3  . potassium chloride SA (K-DUR,KLOR-CON) 20 MEQ tablet Take 1 tablet by mouth daily.  0  . prochlorperazine (COMPAZINE) 10 MG tablet Take 1 tablet (10 mg total) by mouth every 6 (six) hours as needed for nausea or vomiting. 30 tablet 2  . Sodium Chloride Flush (NORMAL SALINE FLUSH) 0.9 % SOLN Flush PICC line twice a week with 17m prior to flushing with Heparin. 16 Syringe 0  . sucralfate (CARAFATE) 1 g tablet Take 1 tablet (1 g total) by mouth 4 (four) times daily -  with meals and at bedtime. 5 min before meals for radiation induced esophagitis 120 tablet 2  . warfarin (COUMADIN) 5 MG tablet Take 1 tablet (5 mg total) by mouth daily at 6 PM. 30 tablet 0  . oxyCODONE (OXY IR/ROXICODONE) 5 MG immediate release tablet Reported on 06/20/2016  0   No current facility-administered medications for this visit.   Facility-Administered Medications Ordered in Other Visits  Medication Dose Route Frequency Provider Last Rate Last Dose  . 0.9 %  sodium chloride infusion   Intravenous Once SPatrici Ranks MD      . CARBOplatin (PARAPLATIN) 200 mg in sodium chloride 0.9 % 100 mL chemo infusion  200 mg Intravenous Once SPatrici Ranks MD      . dexamethasone (DECADRON) 20 mg in sodium chloride 0.9 % 50 mL IVPB  20 mg Intravenous Once SPatrici Ranks MD      . diphenhydrAMINE (BENADRYL) injection 50 mg  50 mg Intravenous Once SPatrici Ranks MD      . famotidine (PEPCID) IVPB 20 mg premix  20 mg Intravenous Once SPatrici Ranks MD      . heparin lock flush 100 unit/mL  250 Units Intracatheter Once PRN SPatrici Ranks MD      . PACLitaxel (TAXOL) 96 mg in dextrose 5 % 250 mL chemo infusion (</= '80mg'$ /m2)  45 mg/m2 (Treatment Plan Actual)  Intravenous Once SPatrici Ranks MD      . palonosetron (ALOXI) injection 0.25 mg  0.25 mg Intravenous Once SPatrici Ranks MD      . sodium chloride flush (NS) 0.9 % injection 10 mL  10 mL Intracatheter PRN SPatrici Ranks MD        Review of Systems  Constitutional: Positive for malaise/fatigue and weight loss.       Decreased appetite  HENT: Positive for sore throat.   Eyes: Negative.   Respiratory: Negative.   Cardiovascular: Positive for leg swelling.       Occasional left leg swelling, managed with compression socks.  Gastrointestinal: Negative.   Genitourinary: Negative.   Musculoskeletal:       Consistent Right shoulder pain.  Skin: Negative.   Neurological: Positive for speech change, focal weakness and weakness.       Right arm and bilateral leg weakness. Hoarse voice  Endo/Heme/Allergies: Negative.   Psychiatric/Behavioral: Negative.   All other systems reviewed and are negative.  14 point ROS was done and is otherwise as detailed above or in HPI    PHYSICAL EXAMINATION: ECOG PERFORMANCE STATUS: 2 - Symptomatic, <50% confined to bed  Filed Vitals:   06/20/16 0851  BP: 93/52  Pulse: 79  Temp: 98.1 F (36.7 C)  Resp: 18   Filed Weights   06/20/16 0851  Weight: 206 lb (93.441 kg)    Physical Exam  Constitutional: He is oriented to person, place, and time and well-developed, well-nourished, and in no distress.  HENT:  Head: Normocephalic and atraumatic.  Mouth/Throat: Oropharynx is clear and moist.  Eyes: Conjunctivae and EOM are normal. Pupils are equal, round, and reactive to light. Right eye exhibits no discharge. Left eye exhibits no discharge. No scleral icterus.  Neck: Normal range of motion. Neck supple. No JVD present. No tracheal deviation present. No thyromegaly present.  Cardiovascular: Normal rate, regular rhythm and normal heart sounds.   Pulmonary/Chest: Effort normal and breath sounds normal. No respiratory distress. He has no  wheezes.  Abdominal: Soft. Bowel sounds are normal. He exhibits no distension and no mass. There is no tenderness. There is no rebound and no guarding.  Musculoskeletal: Normal range of motion. He exhibits no edema.  Compression hose LE bilaterally, muscle atrophy R scapular region  Lymphadenopathy:    He has no cervical adenopathy.  Neurological: He is alert and oriented to person, place, and time. No cranial nerve deficit.  Right UE weakness. Weak hip flexors. Cannot stand without pushing up on sides of chair with arms, difficulty lifting legs from hips  Skin: Skin is warm and dry. No erythema.  Psychiatric: Mood, memory, affect and judgment normal.  Nursing note and vitals reviewed.   LABORATORY DATA:  I have reviewed the data as listed Lab Results  Component Value Date   WBC 5.6 06/20/2016   HGB 9.3* 06/20/2016   HCT 28.7* 06/20/2016   MCV 91.1 06/20/2016   PLT 174 06/20/2016   CMP     Component Value Date/Time   NA 137 06/20/2016 0854   K 3.6 06/20/2016 0854   CL 101 06/20/2016 0854   CO2 28 06/20/2016 0854   GLUCOSE 190* 06/20/2016 0854   BUN 17 06/20/2016 0854   CREATININE 1.08 06/20/2016 0854   CALCIUM 8.4* 06/20/2016 0854   PROT 5.7* 06/20/2016 0854   ALBUMIN 2.6* 06/20/2016 0854   AST 28 06/20/2016 0854   ALT 40 06/20/2016 0854   ALKPHOS 67 06/20/2016 0854   BILITOT 1.6* 06/20/2016 0854   GFRNONAA >60 06/20/2016 0854   GFRAA >60 06/20/2016 0854      RADIOGRAPHIC STUDIES: I have personally reviewed the radiological images as listed and agreed with the findings in the report. No results found.  Study Result     CLINICAL DATA: Initial treatment strategy for RIGHT upper lobe and the mediastinal mass.  EXAM: NUCLEAR MEDICINE PET SKULL BASE TO THIGH  TECHNIQUE: 9.5 mCi F-18 FDG was injected intravenously. Full-ring PET imaging was performed from the skull base to thigh after the radiotracer. CT data was obtained and used for attenuation correction  and anatomic localization.  FASTING BLOOD GLUCOSE: Value: 211 mg/dl  COMPARISON: CT thorax 04/29/2016  FINDINGS: NECK  No hypermetabolic lymph nodes in the neck.  CHEST  RIGHT upper lobe mass invading the mediastinum is intensely metabolic with SUV max equals 16.4. The metabolic activity is relatively homogeneous throughout the mass lesion. The lesion measures 4.8 by a 4.1 cm and axial dimension. The mass deviates the trachea and esophagus leftward.  There are no hypermetabolic mediastinal lymph nodes.  Lobular nodule in the LEFT upper lobe measuring 10 mm (image 76, series 4) with SUV max equal 5.8. Ground-glass nodule in the LEFT upper lobe  measuring 15 mm also has associated metabolic activity with SUV max of 2.7 which is high for the low degree of cellularity of this lesion.  Of note, there is a well-circumscribed new lesion in the RIGHT lower paratracheal station measuring 3.5 x 2.4 cm with thick rim and internal gas and semi-solid material (image 67, series 4). This presumably represents a post procedural collection following a bronchoscopy and lymph node sampling. There is no significant metabolic activity associated with this lesion which would indicate that lesion is not superinfected.  ABDOMEN/PELVIS  There is thickening of LEFT and RIGHT adrenal gland. Adrenal glands have low-attenuation consists with adenomas. Neither of these enlarged gland significant metabolic activity above background liver activity. The RIGHT gland does have a focus nodularity with higher density.  No abnormal metabolic activity liver. No hypermetabolic abdominal pelvic lymph nodes. Atherosclerotic calcification aorta.  SKELETON  No focal hypermetabolic activity to suggest skeletal metastasis.  IMPRESSION: 1. Intensely hypermetabolic RIGHT upper lobe mass invading the mediastinum consistent with malignant Pancoast tumor. Metabolic activity is uniform throughout  the lesion. 2. No evidence of metastatic adenopathy in the mediastinum. 3. Two LEFT upper lobe pulmonary nodules are concerning for metastatic disease. 4. New gas and semi-solid tissue collection in the RIGHT lower paratracheal location is presumed sequelae of the bronchoscopy and lymph node sampling. This collection is rather large at 3.5 cm but no significant metabolic activity which suggests a sterile collection. No pneumothorax. 5. Bilateral adrenal adenomas. These results will be called to the ordering clinician or representative by the Radiologist Assistant, and communication documented in the PACS or zVision Dashboard.   Electronically Signed  By: Suzy Bouchard M.D.  On: 05/10/2016 14:50   PATHOLOGY   ASSESSMENT & PLAN:  R Pancoast Tumor CT Cervical Spine 04/22/2016 showed a pancoast tumor in the right lung apex invading the mediastinum and the C7, T1 and T2 vertebra with extension into the neural foramina and into the spinal canal.  Consistent Right shoulder pain for the last 6 months R arm/hand weakness History of tobacco use Hip flexor weakness Cancer related pain Anemia  Will proceed with cycle #5 of weekly carboplatin/taxol today. If his PS continues to decline I will hold on additional chemotherapy.  We will touch base with home health regarding home management of his decubiti. I would like for him to consider PT but he wants to wait until he has finished therapy.   Pain is improved. Physically however, his PS is declining.   Have discussed end of life issues with patient and family in past, they are not unrealistic. They remain hopeful at this point. We will reassess next week.  All questions were answered. The patient knows to call the clinic with any problems, questions or concerns.  This document serves as a record of services personally performed by Ancil Linsey, MD. It was created on her behalf by Toni Amend, a trained medical scribe. The  creation of this record is based on the scribe's personal observations and the provider's statements to them. This document has been checked and approved by the attending provider.  I have reviewed the above documentation for accuracy and completeness, and I agree with the above.  This note was electronically signed.    Molli Hazard, MD  06/20/2016 9:40 AM

## 2016-06-20 NOTE — Progress Notes (Deleted)
[place]  PROGRESS NOTE  Patient Care Team: George Blitz, Pollard as PCP - General (Internal Medicine)  CHIEF COMPLAINTS:  CT Cervical Spine 04/22/2016 showed a pancoast tumor in the right lung apex invading the mediastinum and the C7, T1 and T2 vertebra with extension into the neural foramina and into the spinal canal.  Consistent Right shoulder pain for the last 6 months    Pancoast tumor of right lung ([place])   04/29/2016 Imaging Pancoast tumor extending from medial RUL into R posterior superior mediastinum. abuts and may invade esoph and post trachea, abuts central R subclavian artery, erosion of the R anterior aspects of the C7, T1 and T2 vertebra   05/03/2016 Pathology Results Not diagnostic   05/03/2016 Procedure Video bronchoscopy, Transbronchial ultrasound guided biopsy of 4R LN, mediastinoscopy with direct biopsy of 2R LN by George Pollard.   05/10/2016 PET scan intensely hypermetabolic RUL mass invading mediastinum c/w pancoast tumor. 2 LUL pulmonary nodules. Bilateral adrenal adenomas   05/17/2016 Procedure CT guided biopsy of right pancoast tumor by IR   05/20/2016 Pathology Results Lung, needle/core biopsy(ies), Right Upper Lobe SQUAMOUS CELL CARCINOMA   05/23/2016 -  Radiation Therapy XRT George Pollard.  Targeted at pancoast tumor and two LUL pulmonary nodules.   05/23/2016 -  Chemotherapy Weekly Carboplatin/Paclitaxel    HISTORY OF PRESENTING ILLNESS:  George Pollard 82 y.o. male is here for ongoing follow-up of R pancoast tumor, squamous cell histology PET also suggested 2 LUL pulmonary nodules.  He is receiving XRT to the pancoast tumor and the 2 LUL lung nodules, he is receiving concurrent carbo/taxol.  Mr. George Pollard returns to the [facility]  He is in a wheelchair today and speaks in a whisper. He is accompanied today by his wife and daughter, who say that he uses a walker these days and has some trouble walking. Mr. George Pollard notes that, sometimes, if he turns  around really fast, he gets dizzy. He adds it's "no more than I ever have been, really." He does confess that he's weak in his legs. His wife notes "they get kind of bouncy." He says he can stand at a desk for 10-20 minutes, then when he turns around, his legs get a little weak, and he is wobbly.  He is able to get up out of his wheelchair and walk across the room after pushing himself up. He cannot stand without the assistance of his hands. He notes that, 3 months ago, his defibrillator was reset and his heart medication was changed. He notes that he thinks he remembers the new heart medication having a side-effect on walking. He also adds "when I first stand up, I've got to get my bearings." His wife comments that he can't lift his knees very well, indicating that his hip flexors may be a key weak point.  His wife confirms that he's drinking and eating. She says he's drinking a lot of water, ginger ale, sprite; he's eating "shakes," which they are mixing with Boost.  He notes today that his pain is probably at an average of 7, saying that it varies. He is not on neurontin. He notes an "immediate difference" when he takes a pain pill. He says he usually takes one when he wakes up, probably taking them three times a day and twice at night.  When asked about his bowels, he notes that he took Miralax and a suppository which, according to his wife and daughter, "caused a bomb."  He  has not been on a regular bowel regimen. He notes that he understands he needs to do this.  No nausea, vomiting. Constipation as detailed. Leg weakness as detailed. No headaches or blurry vision.   // ***   MEDICAL HISTORY:  Past Medical History  Diagnosis Date  . CAD (coronary artery disease)     a. CABG 1997. b. NSTEMI (troponin >20) s/p PTCA/DES to LCx 10/21/12, overlapping DES to mid & distal RCA 10/26/12; c. 02/2016 MV: EF 37%, large fixed defect - apex, inf, septal, lat wall, no ischemia.  . Atrial fibrillation ([place])    . Hypertension   . BPH (benign prostatic hyperplasia)   . PVD (peripheral vascular disease) ([place])     a. AAA repair 2001.  Marland Kitchen Systolic CHF ([place])     a. 10/2012: acute on chronic systolic CHF / pulmonary edema secondary to severe mixed cardiomyopathy;  b. 02/2016 Echo: EF 30-35%, sev dil RV, RV dysfxn, massively dil RA, PASP 85mHg.  . Diabetes mellitus ([place]     a. Noted 10/2012 (prior hx of DM resolved with weight loss).  . Ventricular tachycardia ([place]     a. 02/2016 4 ICD shocks for VT-->Amio added.  .Marland KitchenAICD (automatic cardioverter/defibrillator) present   . Shortness of breath dyspnea   . Anxiety   . Lung cancer ([place]     pancoast tumor right lung apex invading mediastinum and C7, T1, T2 with extension into the neural foramina and into the spinal canal  . Pancoast tumor of right lung ([place] 05/17/2016  . Cardiomyopathy ([place]   . H/O echocardiogram 02/2016    EF 30-35%  . Shoulder pain     SURGICAL HISTORY: Past Surgical History  Procedure Laterality Date  . Coronary artery bypass graft      1997  . Abdominal aortic aneurysm repair      08/26/2000  . Cataract extraction    . Tonsillectomy    . Left heart catheterization with coronary/graft angiogram N/A 10/21/2012    Procedure: LEFT HEART CATHETERIZATION WITH CBeatrix Pollard  Surgeon: George Pollard;  [facility];  Service: Cardiovascular;  Laterality: N/A;  . Percutaneous coronary stent intervention (pci-s) N/A 10/26/2012    Procedure: PERCUTANEOUS CORONARY STENT INTERVENTION (PCI-S);  Surgeon: George Pollard;  [facility];  Service: Cardiovascular;  Laterality: N/A;  . Cardiac catheterization    . Mediastinoscopy N/A 05/03/2016    Procedure: MEDIASTINOSCOPY;  Surgeon: George Pollard;  [place] [facility]  Service: Thoracic;  Laterality: N/A;  . Video bronchoscopy with endobronchial ultrasound N/A 05/03/2016    Procedure: VIDEO BRONCHOSCOPY WITH ENDOBRONCHIAL ULTRASOUND;  Surgeon:  George Pollard;  [facility];  Service: Thoracic;  Laterality: N/A;    SOCIAL HISTORY: Social History   Social History  . Marital Status: Married    Spouse Name: N/A  . Number of Children: 4  . Years of Education: N/A   Occupational History  . Not on file.   Social History Main Topics  . Smoking status: Former Smoker -- 2.00 packs/day for 20 years    Types: Cigarettes    Quit date: 12/17/1995  . Smokeless tobacco: Never Used     Comment: Quit 1997  . Alcohol Use: No  . Drug Use: No  . Sexual Activity: No   Other Topics Concern  . Not on file   Social History Narrative   [place]  Lives with wife.     Married 61 years 4  children 8 grandchildren 8 great-grandchildren Ex smoker, quit in 1997. He was a heavy smoker, at least 2 ppd. Started at [age] yo. Worked most years at a [employer]. 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  He does carpentry as a hobby.   FAMILY HISTORY: Family History  Problem Relation Age of Onset  . Valvular heart disease Daughter     MVP repair   Father died at 22 yo. Received artifical knee replacement and fell after which broke it up. He had it replaced then fell again. At this point he just sat and gained weight. Alzheimer's. Had an aneurysm. Mother died at 63 yo. She had a heart attack in her 44s. Congestive heart failure. 2 brothers and 2 sisters total siblings. All 4 siblings have had aneurysms. 3 had surgeries, and 1 was too unhealthy for surgery. All were smokers.  2 brothers are still living.  ALLERGIES:  has No Known Allergies.  MEDICATIONS:  Current Outpatient Prescriptions  Medication Sig Dispense Refill  . amiodarone (PACERONE) 200 MG tablet Take 1 tablet by mouth 2 (two) times daily.  0  . aspirin EC 81 MG tablet Take 81 mg by mouth daily.    Marland Kitchen atorvastatin (LIPITOR) 80 MG tablet take 1 tablet by mouth once daily 30 tablet 2  . CARBOPLATIN IV Inject into the  vein. To be given weekly with radiation    . fentaNYL (DURAGESIC - DOSED MCG/HR) 50 MCG/HR Place 1 patch (50 mcg total) onto the skin every 3 (three) days. 10 patch 0  . furosemide (LASIX) 40 MG tablet Take 1 tablet (40 mg total) by mouth 2 (two) times daily. (Patient taking differently: Take 80 mg by mouth 2 (two) times daily. ) 180 tablet 3  . gabapentin (NEURONTIN) 300 MG capsule Take 1 capsule (300 mg total) by mouth at bedtime. 60 capsule 1  . Heparin Lock Flush (HEPARIN FLUSH, PORCINE,) 100 UNIT/ML injection Flush PICC line twice a week. First flush with Saline. Then Flush PICC line with 2.19m of Heparin. 16 Syringe 0  . HYDROcodone-acetaminophen (NORCO) 10-325 MG tablet Take 1 tablet by mouth every 4 (four) hours as needed. 90 tablet 0  . lisinopril (PRINIVIL,ZESTRIL) 2.5 MG tablet take 1 tablet by mouth once daily 30 tablet 2  . metFORMIN (GLUCOPHAGE) 500 MG tablet Take 500 mg by mouth daily.   0  . Metoprolol Tartrate (LOPRESSOR) 50 MG tablet Take 1.5 tablets (75 mg total) by mouth 2 (two) times daily. 90 tablet 6  . nitroGLYCERIN (NITROSTAT) 0.4 MG SL tablet Place 1 tablet (0.4 mg total) under the tongue every 5 (five) minutes as needed for chest pain (up to 3 doses). 25 tablet 2  . ondansetron (ZOFRAN) 8 MG tablet Take 1 tablet (8 mg total) by mouth every 8 (eight) hours as needed for nausea or vomiting. 30 tablet 2  . PACLitaxel (TAXOL IV) Inject into the vein. To be given weekly with radiation    . Potassium Bicarb-Citric Acid 20 MEQ TBEF Take 1 tablet (20 mEq total) by mouth daily. 30 each 3  . potassium chloride SA (K-DUR,KLOR-CON) 20 MEQ tablet Take 1 tablet by mouth daily.  0  . prochlorperazine (COMPAZINE) 10 MG tablet Take 1 tablet (10 mg total) by mouth every 6 (six) hours as needed for nausea or vomiting. 30 tablet 2  . Sodium Chloride Flush (NORMAL SALINE FLUSH) 0.9 % SOLN Flush PICC line twice a week with 138mprior  to flushing with Heparin. 16 Syringe 0  . sucralfate  (CARAFATE) 1 g tablet Take 1 tablet (1 g total) by mouth 4 (four) times daily -  with meals and at bedtime. 5 min before meals for radiation induced esophagitis 120 tablet 2  . warfarin (COUMADIN) 5 MG tablet Take 1 tablet (5 mg total) by mouth daily at 6 PM. 30 tablet 0  . oxyCODONE (OXY IR/ROXICODONE) 5 MG immediate release tablet Reported on 06/20/2016  0   No current facility-administered medications for this visit.    Review of Systems  Constitutional: Positive for weight loss (5 lbs weight loss since 6/2) and malaise/fatigue.       Decreased appetite  HENT: Positive for sore throat.   Eyes: Negative.   Respiratory: Negative.   Cardiovascular: Positive for leg swelling.       Occasional left leg swelling, managed with compression socks.  Gastrointestinal: Negative.   Genitourinary: Negative.   Musculoskeletal:       Consistent Right shoulder pain.  Skin: Negative.   Neurological: Positive for speech change, focal weakness and weakness.       Right arm and bilateral leg weakness. Hoarse voice  Endo/Heme/Allergies: Negative.   Psychiatric/Behavioral: Negative.   All other systems reviewed and are negative.  14 point ROS was done and is otherwise as detailed above or in HPI  ***  PHYSICAL EXAMINATION: ECOG PERFORMANCE STATUS: 2 - Symptomatic, <50% confined to bed  Filed Vitals:   06/20/16 0851  BP: 93/52  Pulse: 79  Temp: 98.1 F (36.7 C)  Resp: 18   Filed Weights   06/20/16 0851  Weight: 206 lb (93.441 kg)    Physical Exam  Constitutional: He is oriented to person, place, and time and well-developed, well-nourished, and in no distress.  HENT:  Head: Normocephalic and atraumatic.  Mouth/Throat: Oropharynx is clear and moist.  Eyes: Conjunctivae and EOM are normal. Pupils are equal, round, and reactive to light. Right eye exhibits no discharge. Left eye exhibits no discharge. No scleral icterus.  Neck: Normal range of motion. Neck supple. No JVD present. No  tracheal deviation present. No thyromegaly present.  Cardiovascular: Normal rate, regular rhythm and normal heart sounds.   Pulmonary/Chest: Effort normal and breath sounds normal. No respiratory distress. He has no wheezes.  Abdominal: Soft. Bowel sounds are normal. He exhibits no distension and no mass. There is no tenderness. There is no rebound and no guarding.  Musculoskeletal: Normal range of motion. He exhibits no edema.  Compression hose LE bilaterally, muscle atrophy R scapular region  Lymphadenopathy:    He has no cervical adenopathy.  Neurological: He is alert and oriented to person, place, and time. No cranial nerve deficit.  Right UE weakness. Weak hip flexors. Cannot stand without pushing up on sides of chair with arms, difficulty lifting legs from hips  Skin: Skin is warm and dry. No erythema.  Psychiatric: Mood, memory, affect and judgment normal.  Nursing note and vitals reviewed.   LABORATORY DATA:  I have reviewed the data as listed Lab Results  Component Value Date   WBC 4.8 06/12/2016   HGB 10.0* 06/12/2016   HCT 30.9* 06/12/2016   MCV 90.6 06/12/2016   PLT 243 06/12/2016   CMP     Component Value Date/Time   NA 137 06/12/2016 1845   K 3.5 06/12/2016 1845   CL 100* 06/12/2016 1845   CO2 27 06/12/2016 1845   GLUCOSE 176* 06/12/2016 1845   BUN 23* 06/12/2016 1845  CREATININE 0.98 06/12/2016 1845   CALCIUM 8.7* 06/12/2016 1845   PROT 6.0* 06/12/2016 1845   ALBUMIN 2.7* 06/12/2016 1845   AST 26 06/12/2016 1845   ALT 35 06/12/2016 1845   ALKPHOS 65 06/12/2016 1845   BILITOT 1.1 06/12/2016 1845   GFRNONAA >60 06/12/2016 1845   GFRAA >60 06/12/2016 1845     RADIOGRAPHIC STUDIES: I have personally reviewed the radiological images as listed and agreed with the findings in the report. No results found.  Study Result     CLINICAL DATA: Initial treatment strategy for RIGHT upper lobe and the mediastinal mass.  EXAM: NUCLEAR MEDICINE PET SKULL BASE  TO THIGH  TECHNIQUE: 9.5 mCi F-18 FDG was injected intravenously. Full-ring PET imaging was performed from the skull base to thigh after the radiotracer. CT data was obtained and used for attenuation correction and anatomic localization.  FASTING BLOOD GLUCOSE: Value: 211 mg/dl  COMPARISON: CT thorax 04/29/2016  FINDINGS: NECK  No hypermetabolic lymph nodes in the neck.  CHEST  RIGHT upper lobe mass invading the mediastinum is intensely metabolic with SUV max equals 16.4. The metabolic activity is relatively homogeneous throughout the mass lesion. The lesion measures 4.8 by a 4.1 cm and axial dimension. The mass deviates the trachea and esophagus leftward.  There are no hypermetabolic mediastinal lymph nodes.  Lobular nodule in the LEFT upper lobe measuring 10 mm (image 76, series 4) with SUV max equal 5.8. Ground-glass nodule in the LEFT upper lobe measuring 15 mm also has associated metabolic activity with SUV max of 2.7 which is high for the low degree of cellularity of this lesion.  Of note, there is a well-circumscribed new lesion in the RIGHT lower paratracheal station measuring 3.5 x 2.4 cm with thick rim and internal gas and semi-solid material (image 67, series 4). This presumably represents a post procedural collection following a bronchoscopy and lymph node sampling. There is no significant metabolic activity associated with this lesion which would indicate that lesion is not superinfected.  ABDOMEN/PELVIS  There is thickening of LEFT and RIGHT adrenal gland. Adrenal glands have low-attenuation consists with adenomas. Neither of these enlarged gland significant metabolic activity above background liver activity. The RIGHT gland does have a focus nodularity with higher density.  No abnormal metabolic activity liver. No hypermetabolic abdominal pelvic lymph nodes. Atherosclerotic calcification aorta.  SKELETON  No focal hypermetabolic  activity to suggest skeletal metastasis.  IMPRESSION: 1. Intensely hypermetabolic RIGHT upper lobe mass invading the mediastinum consistent with malignant Pancoast tumor. Metabolic activity is uniform throughout the lesion. 2. No evidence of metastatic adenopathy in the mediastinum. 3. Two LEFT upper lobe pulmonary nodules are concerning for metastatic disease. 4. New gas and semi-solid tissue collection in the RIGHT lower paratracheal location is presumed sequelae of the bronchoscopy and lymph node sampling. This collection is rather large at 3.5 cm but no significant metabolic activity which suggests a sterile collection. No pneumothorax. 5. Bilateral adrenal adenomas. These results will be called to the ordering clinician or representative by the Radiologist Assistant, and communication documented in the PACS or zVision Dashboard.   Electronically Signed  By: Suzy Bouchard M.D.  On: 05/10/2016 14:50   PATHOLOGY   ASSESSMENT & PLAN:  R Pancoast Tumor CT Cervical Spine 04/22/2016 showed a pancoast tumor in the right lung apex invading the mediastinum and the C7, T1 and T2 vertebra with extension into the neural foramina and into the spinal canal.  Consistent Right shoulder pain for the last 6 months R arm/hand  weakness History of tobacco use Hip flexor weakness Cancer related pain Anemia  He will continue with cycle #2 of weekly carboplatin/taxol. He will continue with XRT. I have increased his duragesic to 50 mcg given that he is still using 5 breakthrough pills significant pain.  I have also added neurontin at night.   We readdressed adequate management of his constipation. I advised the patient and his wife to read through our constipation sheet, he needs a daily regimen. Either Miralax daily, MOM daily. He could also use a stimulant laxative if needed.   I have referred home health for PT and strengthening.   He will return in one week for ongoing therapy and  reassessment.   // ***   All questions were answered. The patient knows to call the clinic with any problems, questions or concerns.  This document serves as a record of services personally performed by Ancil Linsey, Pollard. It was created on her behalf by Toni Amend, a trained medical scribe. The creation of this record is based on the scribe's personal observations and the provider's statements to them. This document has been checked and approved by the attending provider.  I have reviewed the above documentation for accuracy and completeness, and I agree with the above.  This note was electronically signed.    Marlena Clipper, RN  06/20/2016 9:02 AM

## 2016-06-21 ENCOUNTER — Ambulatory Visit
Admission: RE | Admit: 2016-06-21 | Discharge: 2016-06-21 | Disposition: A | Discharge status: Home / Self Care | Payer: Medicare Other | Source: Ambulatory Visit | Attending: Radiation Oncology | Admitting: Radiation Oncology

## 2016-06-21 ENCOUNTER — Encounter: Payer: Self-pay | Admitting: Radiation Oncology

## 2016-06-21 VITALS — BP 117/64 | HR 90 | Resp 16 | Wt 208.8 lb

## 2016-06-21 DIAGNOSIS — Z510 Encounter for antineoplastic radiation therapy: Secondary | ICD-10-CM | POA: Diagnosis not present

## 2016-06-21 DIAGNOSIS — C3411 Malignant neoplasm of upper lobe, right bronchus or lung: Secondary | ICD-10-CM

## 2016-06-21 NOTE — Progress Notes (Signed)
Department of Radiation Oncology  Phone:  [phone] Fax:        [phone]  Weekly Treatment Note    Name: George Pollard Date: 06/21/2016 MRN: [id number] DOB: 1933/05/09   Diagnosis:     ICD-9-CM ICD-10-CM   1. Pancoast tumor of right lung ([facility]) 162.3 C34.11      Current dose: 40 Gy  Current fraction: 20   MEDICATIONS: Current Outpatient Prescriptions  Medication Sig Dispense Refill  . amiodarone (PACERONE) 200 MG tablet Take 1 tablet by mouth 2 (two) times daily.  0  . aspirin EC 81 MG tablet Take 81 mg by mouth daily.    Marland Kitchen atorvastatin (LIPITOR) 80 MG tablet take 1 tablet by mouth once daily 30 tablet 2  . CARBOPLATIN IV Inject into the vein. To be given weekly with radiation    . fentaNYL (DURAGESIC - DOSED MCG/HR) 50 MCG/HR Place 1 patch (50 mcg total) onto the skin every 3 (three) days. 10 patch 0  . furosemide (LASIX) 40 MG tablet Take 1 tablet (40 mg total) by mouth 2 (two) times daily. (Patient taking differently: Take 80 mg by mouth 2 (two) times daily. ) 180 tablet 3  . gabapentin (NEURONTIN) 300 MG capsule Take 1 capsule (300 mg total) by mouth at bedtime. 60 capsule 1  . Heparin Lock Flush (HEPARIN FLUSH, PORCINE,) 100 UNIT/ML injection Flush PICC line twice a week. First flush with Saline. Then Flush PICC line with 2.30m of Heparin. 16 Syringe 0  . HYDROcodone-acetaminophen (NORCO) 10-325 MG tablet Take 1 tablet by mouth every 4 (four) hours as needed. 90 tablet 0  . lisinopril (PRINIVIL,ZESTRIL) 2.5 MG tablet take 1 tablet by mouth once daily 30 tablet 2  . metFORMIN (GLUCOPHAGE) 500 MG tablet Take 500 mg by mouth daily.   0  . Metoprolol Tartrate (LOPRESSOR) 50 MG tablet Take 1.5 tablets (75 mg total) by mouth 2 (two) times daily. 90 tablet 6  . nitroGLYCERIN (NITROSTAT) 0.4 MG SL tablet Place 1 tablet (0.4 mg total) under the tongue every 5 (five) minutes as needed for chest pain (up to 3 doses). 25 tablet 2  . ondansetron (ZOFRAN) 8 MG tablet Take 1  tablet (8 mg total) by mouth every 8 (eight) hours as needed for nausea or vomiting. 30 tablet 2  . oxyCODONE (OXY IR/ROXICODONE) 5 MG immediate release tablet Reported on 06/20/2016  0  . PACLitaxel (TAXOL IV) Inject into the vein. To be given weekly with radiation    . Potassium Bicarb-Citric Acid 20 MEQ TBEF Take 1 tablet (20 mEq total) by mouth daily. 30 each 3  . potassium chloride SA (K-DUR,KLOR-CON) 20 MEQ tablet Take 1 tablet by mouth daily.  0  . prochlorperazine (COMPAZINE) 10 MG tablet Take 1 tablet (10 mg total) by mouth every 6 (six) hours as needed for nausea or vomiting. 30 tablet 2  . Sodium Chloride Flush (NORMAL SALINE FLUSH) 0.9 % SOLN Flush PICC line twice a week with 165mprior to flushing with Heparin. 16 Syringe 0  . sucralfate (CARAFATE) 1 g tablet Take 1 tablet (1 g total) by mouth 4 (four) times daily -  with meals and at bedtime. 5 min before meals for radiation induced esophagitis 120 tablet 2  . warfarin (COUMADIN) 5 MG tablet Take 1 tablet (5 mg total) by mouth daily at 6 PM. 30 tablet 0   No current facility-administered medications for this encounter.     ALLERGIES: Review of patient's allergies indicates no known  allergies.   LABORATORY DATA:  Lab Results  Component Value Date   WBC 5.6 06/20/2016   HGB 9.3* 06/20/2016   HCT 28.7* 06/20/2016   MCV 91.1 06/20/2016   PLT 174 06/20/2016   Lab Results  Component Value Date   NA 137 06/20/2016   K 3.6 06/20/2016   CL 101 06/20/2016   CO2 28 06/20/2016   Lab Results  Component Value Date   ALT 40 06/20/2016   AST 28 06/20/2016   ALKPHOS 67 06/20/2016   BILITOT 1.6* 06/20/2016     NARRATIVE: George Pollard was seen today for weekly treatment management. The chart was checked and the patient's films were reviewed.  Weight and vitals stable. Reports right shoulder pain 2 on a scale of 0-10. Notes pain improvement over the last 2 weeks. Reports he is no long having to apply BENGAY to his shoulder.  Reports his right hand remains numb. Continues Fentanyl 50 and neurontin to manage pain. Reports a productive cough only in the morning with clear frothy sputum. Reports intermittent difficulty swallowing liquids. He denies pain with swallowing. He is able to eat. Hoarseness continues. Denies skin changes within treatment field. Reports using radiaplex as directed. Reports SOB with exertion. Reports fatigue. Reports taking miralax to prevent constipation. Reports Dr. Whitney Muse continues to manage his sacral decubitus.    PHYSICAL EXAMINATION: weight is 208 lb 12.8 oz (94.711 kg). His blood pressure is 117/64 and his pulse is 90. His respiration is 16 and oxygen saturation is 100%.      In wheelchair. Alert and in no acute distress. Hoarse voice.  ASSESSMENT: The patient is doing satisfactorily with treatment.  PLAN: We will continue with the patient's radiation treatment as planned.      ------------------------------------------------  Jodelle Gross, MD, PhD  This document serves as a record of services personally performed by Kyung Rudd, MD. It was created on his behalf by Arlyce Harman, a trained medical scribe. The creation of this record is based on the scribe's personal observations and the provider's statements to them. This document has been checked and approved by the attending provider.

## 2016-06-21 NOTE — Progress Notes (Signed)
Weight and vitals stable. Reports right shoulder pain 2 on a scale of 0-10. Reports he is no long having to apply BENGAY to his shoulder. Reports his right hand remains numb. Continues Fentanyl 50 and neurontin to manage pain. Reports a productive cough only in the morning with clear frothy sputum. Reports intermittent difficulty swallowing liquids. Hoarseness continues. Denies skin changes within treatment field. Reports using radiaplex as directed. Reports SOB with exertion. Reports fatigue. Reports taking miralax to prevent constipation. Reports Dr. Whitney Muse continues to manage his sacral decubitus.   BP 117/64 mmHg  Pulse 90  Resp 16  Wt 208 lb 12.8 oz (94.711 kg)  SpO2 100% Wt Readings from Last 3 Encounters:  06/21/16 208 lb 12.8 oz (94.711 kg)  06/20/16 206 lb (93.441 kg)  06/13/16 210 lb (95.255 kg)

## 2016-06-24 ENCOUNTER — Ambulatory Visit: Payer: Medicare Other

## 2016-06-24 ENCOUNTER — Encounter (HOSPITAL_COMMUNITY): Payer: Self-pay

## 2016-06-24 ENCOUNTER — Ambulatory Visit
Admission: RE | Admit: 2016-06-24 | Discharge: 2016-06-24 | Disposition: A | Discharge status: Home / Self Care | Payer: Medicare Other | Source: Ambulatory Visit | Attending: Radiation Oncology | Admitting: Radiation Oncology

## 2016-06-24 ENCOUNTER — Emergency Department (HOSPITAL_COMMUNITY)
Admission: EM | Admit: 2016-06-24 | Discharge: 2016-06-25 | Disposition: A | Discharge status: Home / Self Care | Payer: Medicare Other | Attending: Emergency Medicine | Admitting: Emergency Medicine

## 2016-06-24 DIAGNOSIS — R531 Weakness: Secondary | ICD-10-CM | POA: Diagnosis present

## 2016-06-24 DIAGNOSIS — Z7901 Long term (current) use of anticoagulants: Secondary | ICD-10-CM | POA: Insufficient documentation

## 2016-06-24 DIAGNOSIS — I110 Hypertensive heart disease with heart failure: Secondary | ICD-10-CM | POA: Diagnosis not present

## 2016-06-24 DIAGNOSIS — E860 Dehydration: Secondary | ICD-10-CM | POA: Insufficient documentation

## 2016-06-24 DIAGNOSIS — I951 Orthostatic hypotension: Secondary | ICD-10-CM

## 2016-06-24 DIAGNOSIS — Z7984 Long term (current) use of oral hypoglycemic drugs: Secondary | ICD-10-CM | POA: Diagnosis not present

## 2016-06-24 DIAGNOSIS — I2510 Atherosclerotic heart disease of native coronary artery without angina pectoris: Secondary | ICD-10-CM | POA: Insufficient documentation

## 2016-06-24 DIAGNOSIS — I5020 Unspecified systolic (congestive) heart failure: Secondary | ICD-10-CM | POA: Insufficient documentation

## 2016-06-24 DIAGNOSIS — Z85110 Personal history of malignant carcinoid tumor of bronchus and lung: Secondary | ICD-10-CM | POA: Diagnosis not present

## 2016-06-24 DIAGNOSIS — Z7982 Long term (current) use of aspirin: Secondary | ICD-10-CM | POA: Diagnosis not present

## 2016-06-24 DIAGNOSIS — Z87891 Personal history of nicotine dependence: Secondary | ICD-10-CM | POA: Diagnosis not present

## 2016-06-24 DIAGNOSIS — I4891 Unspecified atrial fibrillation: Secondary | ICD-10-CM | POA: Diagnosis not present

## 2016-06-24 DIAGNOSIS — Z79899 Other long term (current) drug therapy: Secondary | ICD-10-CM | POA: Insufficient documentation

## 2016-06-24 DIAGNOSIS — E1151 Type 2 diabetes mellitus with diabetic peripheral angiopathy without gangrene: Secondary | ICD-10-CM | POA: Diagnosis not present

## 2016-06-24 LAB — CBC WITH DIFFERENTIAL/PLATELET
Basophils Absolute: 0 10*3/uL (ref 0.0–0.1)
Basophils Relative: 0 %
EOS PCT: 0 %
Eosinophils Absolute: 0 10*3/uL (ref 0.0–0.7)
HCT: 27 % — ABNORMAL LOW (ref 39.0–52.0)
Hemoglobin: 8.8 g/dL — ABNORMAL LOW (ref 13.0–17.0)
LYMPHS ABS: 0.3 10*3/uL — AB (ref 0.7–4.0)
LYMPHS PCT: 7 %
MCH: 29.3 pg (ref 26.0–34.0)
MCHC: 32.6 g/dL (ref 30.0–36.0)
MCV: 90 fL (ref 78.0–100.0)
MONO ABS: 0.2 10*3/uL (ref 0.1–1.0)
Monocytes Relative: 6 %
Neutro Abs: 3.1 10*3/uL (ref 1.7–7.7)
Neutrophils Relative %: 87 %
Platelets: 144 10*3/uL — ABNORMAL LOW (ref 150–400)
RBC: 3 MIL/uL — ABNORMAL LOW (ref 4.22–5.81)
RDW: 18 % — AB (ref 11.5–15.5)
WBC: 3.6 10*3/uL — ABNORMAL LOW (ref 4.0–10.5)

## 2016-06-24 LAB — BASIC METABOLIC PANEL
Anion gap: 5 (ref 5–15)
BUN: 16 mg/dL (ref 6–20)
CHLORIDE: 103 mmol/L (ref 101–111)
CO2: 28 mmol/L (ref 22–32)
Calcium: 8.1 mg/dL — ABNORMAL LOW (ref 8.9–10.3)
Creatinine, Ser: 0.8 mg/dL (ref 0.61–1.24)
GFR calc Af Amer: >60 mL/min (ref >60)
GFR calc non Af Amer: >60 mL/min (ref >60)
GLUCOSE: 165 mg/dL — AB (ref 65–99)
POTASSIUM: 3.9 mmol/L (ref 3.5–5.1)
Sodium: 136 mmol/L (ref 135–145)

## 2016-06-24 LAB — URINALYSIS, ROUTINE W REFLEX MICROSCOPIC
Bilirubin Urine: NEGATIVE
Glucose, UA: NEGATIVE mg/dL
HGB URINE DIPSTICK: NEGATIVE
Ketones, ur: NEGATIVE mg/dL
LEUKOCYTES UA: NEGATIVE
NITRITE: NEGATIVE
PROTEIN: NEGATIVE mg/dL
SPECIFIC GRAVITY, URINE: 1.01 (ref 1.005–1.030)
pH: 7 (ref 5.0–8.0)

## 2016-06-24 LAB — PROTIME-INR
INR: 3.37 — AB (ref 0.00–1.49)
Prothrombin Time: 33.4 seconds — ABNORMAL HIGH (ref 11.6–15.2)

## 2016-06-24 MED ORDER — SODIUM CHLORIDE 0.9 % IV BOLUS (SEPSIS)
500.0000 mL | Freq: Once | INTRAVENOUS | Status: AC
  Administered 2016-06-24: 500 mL via INTRAVENOUS

## 2016-06-24 MED ORDER — ONDANSETRON HCL 4 MG/2ML IJ SOLN
4.0000 mg | Freq: Once | INTRAMUSCULAR | Status: AC
  Administered 2016-06-24: 4 mg via INTRAVENOUS
  Filled 2016-06-24: qty 2

## 2016-06-24 MED ORDER — SODIUM CHLORIDE 0.9 % IV BOLUS (SEPSIS)
1000.0000 mL | Freq: Once | INTRAVENOUS | Status: AC
  Administered 2016-06-24: 1000 mL via INTRAVENOUS

## 2016-06-24 MED ORDER — HEPARIN SOD (PORK) LOCK FLUSH 100 UNIT/ML IV SOLN
INTRAVENOUS | Status: AC
  Filled 2016-06-24: qty 5

## 2016-06-24 NOTE — ED Notes (Signed)
MD at the bedside  

## 2016-06-24 NOTE — ED Notes (Signed)
Complain of generalized weakness. Also, vomiting that started on Saturday. Pt is a cancer patient and gets chemo and radiation, last was on Thursday.

## 2016-06-24 NOTE — ED Provider Notes (Signed)
CSN: [id number]     Arrival date & time 06/24/16  1808 History   First MD Initiated Contact with Patient 06/24/16 Walker     Chief Complaint  Patient presents with  . Fatigue     Patient is a 82 y.o. male presenting with weakness. The history is provided by the patient, a caregiver and the spouse.  Weakness This is a new problem. The current episode started more than 2 days ago. The problem occurs daily. The problem has been gradually worsening. Pertinent negatives include no chest pain, no abdominal pain, no headaches and no shortness of breath. The symptoms are aggravated by walking. The symptoms are relieved by rest.  Patient with h/o CAD, CHF, has ICD in place, also h/o pancoast tumor undergoing radiation/chemo therapy He reports for at least 2 days he has had no appetite and has had nausea with dry heaving No blood in emesis No fever No diarrhea (he had normal BM today) No new cp/sob He has pain in right shoulder region that is unchanged and is caused by his known cancer   Past Medical History  Diagnosis Date  . CAD (coronary artery disease)     a. CABG 1997. b. NSTEMI (troponin >20) s/p PTCA/DES to LCx 10/21/12, overlapping DES to mid & distal RCA 10/26/12; c. 02/2016 MV: EF 37%, large fixed defect - apex, inf, septal, lat wall, no ischemia.  . Atrial fibrillation ([place])   . Hypertension   . BPH (benign prostatic hyperplasia)   . PVD (peripheral vascular disease) ([place])     a. AAA repair 2001.  Marland Kitchen Systolic CHF ([place])     a. 10/2012: acute on chronic systolic CHF / pulmonary edema secondary to severe mixed cardiomyopathy;  b. 02/2016 Echo: EF 30-35%, sev dil RV, RV dysfxn, massively dil RA, PASP 96mHg.  . Diabetes mellitus ([place]     a. Noted 10/2012 (prior hx of DM resolved with weight loss).  . Ventricular tachycardia ([place]     a. 02/2016 4 ICD shocks for VT-->Amio added.  .Marland KitchenAICD (automatic cardioverter/defibrillator) present   . Shortness of breath dyspnea   . Anxiety   . 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 [place] right [place] 05/17/2016  . Cardiomyopathy ([place]   . H/O echocardiogram 02/2016    EF 30-35%  . Shoulder pain    Past Surgical History  Procedure Laterality Date  . Coronary artery bypass graft      1997  . Abdominal aortic aneurysm repair      08/26/2000  . Cataract extraction    . Tonsillectomy    . Left heart catheterization with coronary/graft angiogram N/A 10/21/2012    Procedure: LEFT HEART CATHETERIZATION WITH CBeatrix Fetters  Surgeon: CBurnell Blanks MD;  [facility];  Service: Cardiovascular;  Laterality: N/A;  . Percutaneous coronary stent intervention (pci-s) N/A 10/26/2012    Procedure: PERCUTANEOUS CORONARY STENT INTERVENTION (PCI-S);  Surgeon: MSherren Mocha MD;  [facility];  Service: Cardiovascular;  Laterality: N/A;  . Cardiac catheterization    . Mediastinoscopy N/A 05/03/2016    Procedure: MEDIASTINOSCOPY;  Surgeon: PIvin Poot MD;  [place] [facility] [place]  Service: Thoracic;  Laterality: N/A;  . Video bronchoscopy with endobronchial ultrasound N/A 05/03/2016    Procedure: VIDEO BRONCHOSCOPY WITH ENDOBRONCHIAL ULTRASOUND;  Surgeon: PIvin Poot MD;  Location[facility]  [facility];  Service: Thoracic;  Laterality: N/A;   Family History  Problem Relation Age of Onset  . Valvular heart disease Daughter     MVP repair   Social History  Substance Use Topics  . Smoking status: Former Smoker -- 2.00 packs/day for 20 years    Types: Cigarettes    Quit date: 12/17/1995  . Smokeless tobacco: Never Used     Comment: Quit 1997  . Alcohol Use: No    Review of Systems  Constitutional: Positive for fatigue. Negative for fever.  Respiratory: Negative for shortness of breath.   Cardiovascular: Negative for chest pain.  Gastrointestinal: Negative for abdominal pain and blood in  stool.  Neurological: Positive for weakness. Negative for headaches.  All other systems reviewed and are negative.     Allergies  Review of patient's allergies indicates no known allergies.  Home Medications   Prior to Admission medications   Medication Sig Start Date End Date Taking? Authorizing Provider  amiodarone (PACERONE) 200 MG tablet Take 1 tablet by mouth 2 (two) times daily. 03/26/16   Historical Provider, MD  aspirin EC 81 MG tablet Take 81 mg by mouth daily.    Historical Provider, MD  atorvastatin (LIPITOR) 80 MG tablet take 1 tablet by mouth once daily 12/18/12   Jolaine Artist, MD  CARBOPLATIN IV Inject into the vein. To be given weekly with radiation    Historical Provider, MD  fentaNYL (DURAGESIC - DOSED MCG/HR) 50 MCG/HR Place 1 patch (50 mcg total) onto the skin every 3 (three) days. 05/30/16   Patrici Ranks, MD  furosemide (LASIX) 40 MG tablet Take 1 tablet (40 mg total) by mouth 2 (two) times daily. Patient taking differently: Take 80 mg by mouth 2 (two) times daily.  04/05/16   Evans Lance, MD  gabapentin (NEURONTIN) 300 MG capsule Take 1 capsule (300 mg total) by mouth at bedtime. 05/30/16   Patrici Ranks, MD  Heparin Lock Flush (HEPARIN FLUSH, PORCINE,) 100 UNIT/ML injection Flush PICC line twice a week. First flush with Saline. Then Flush PICC line with 2.75m of Heparin. 05/28/16   SPatrici Ranks MD  HYDROcodone-acetaminophen (NORCO) 10-325 MG tablet Take 1 tablet by mouth every 4 (four) hours as needed. 06/06/16   TBaird Cancer PA-C  lisinopril (PRINIVIL,ZESTRIL) 2.5 MG tablet take 1 tablet by mouth once daily 12/18/12   DJolaine Artist MD  metFORMIN (GLUCOPHAGE) 500 MG tablet Take 500 mg by mouth daily.  01/22/16   Historical Provider, MD  Metoprolol Tartrate (LOPRESSOR) 50 MG tablet Take 1.5 tablets (75 mg total) by mouth 2 (two) times daily. 03/01/16   CRogelia Mire NP  nitroGLYCERIN (NITROSTAT) 0.4 MG SL tablet Place 1 tablet (0.4 mg  total) under the tongue every 5 (five) minutes as needed for chest pain (up to 3 doses). 10/29/12   Dayna N Dunn, PA-C  ondansetron (ZOFRAN) 8 MG tablet Take 1 tablet (8 mg total) by mouth every 8 (eight) hours as needed for nausea or vomiting. 05/15/16   SPatrici Ranks MD  oxyCODONE (OXY IR/ROXICODONE) 5 MG immediate release tablet Reported on 06/20/2016 05/04/16   Historical Provider, MD  PACLitaxel (TAXOL IV) Inject into the vein. To be given weekly with radiation    Historical Provider, MD  Potassium Bicarb-Citric Acid 20 MEQ TBEF Take 1 tablet (20 mEq total) by mouth daily. 06/13/16   SPatrici Ranks MD  potassium chloride SA (K-DUR,KLOR-CON) 20 MEQ tablet Take 1 tablet by  mouth daily. 01/10/16   Historical Provider, MD  prochlorperazine (COMPAZINE) 10 MG tablet Take 1 tablet (10 mg total) by mouth every 6 (six) hours as needed for nausea or vomiting. 05/15/16   Patrici Ranks, MD  Sodium Chloride Flush (NORMAL SALINE FLUSH) 0.9 % SOLN Flush PICC line twice a week with 55m prior to flushing with Heparin. 05/20/16   TBaird Cancer PA-C  sucralfate (CARAFATE) 1 g tablet Take 1 tablet (1 g total) by mouth 4 (four) times daily -  with meals and at bedtime. 5 min before meals for radiation induced esophagitis 06/07/16   MTyler Pita MD  warfarin (COUMADIN) 5 MG tablet Take 1 tablet (5 mg total) by mouth daily at 6 PM. 03/01/16   CRogelia Mire NP   BP 107/70 mmHg  Pulse 92  Temp(Src) 98.6 F (37 C) (Oral)  Resp 18  Ht '5\' 10"'$  (1.778 m)  Wt 92.987 kg  BMI 29.41 kg/m2  SpO2 92% Physical Exam CONSTITUTIONAL: Elderly frail HEAD: Normocephalic/atraumatic EYES: EOMI/PERRL ENMT: Mucous membranes dry NECK: supple no meningeal signs SPINE/BACK:entire spine nontender CV: irregular, no harsh murmurs LUNGS: Lungs are clear to auscultation bilaterally, no apparent distress ABDOMEN: soft, nontender NEURO: Pt is awake/alert/appropriate, moves all extremitiesx4.   EXTREMITIES: pulses  normal/equal, full ROM SKIN: warm, color normal PSYCH: no abnormalities of mood noted, alert and oriented to situation  ED Course  Procedures  7:18 PM Pt with extensive history here with generalized fatigue/nausea He is orthostatic IV fluids ordered Will follow closely 11:23 PM Pt with good response to IV fluids He feels improved He did have orthostatic hypotension that improved He is well appearing No new complaints He would like to be discharged He was made aware of elevated INR, reports he will get this checked this week  Labs Review Labs Reviewed  BASIC METABOLIC PANEL - Abnormal; Notable for the following:    Glucose, Bld 165 (*)    Calcium 8.1 (*)    All other components within normal limits  CBC WITH DIFFERENTIAL/PLATELET - Abnormal; Notable for the following:    WBC 3.6 (*)    RBC 3.00 (*)    Hemoglobin 8.8 (*)    HCT 27.0 (*)    RDW 18.0 (*)    Platelets 144 (*)    Lymphs Abs 0.3 (*)    All other components within normal limits  PROTIME-INR - Abnormal; Notable for the following:    Prothrombin Time 33.4 (*)    INR 3.37 (*)    All other components within normal limits  URINALYSIS, ROUTINE W RE[facility]LEX [facility]CROSCOPIC [facility] - Abnormal; Notable for the following:    Color, Urine AMBER (*)    All other components within normal limits    I have personally reviewed and evaluated these lab results as part of my medical decision-making.   EKG Interpretation   Date/Time:  Monday June 24 2016 18:59:01 EDT Ventricular Rate:  96 PR Interval:    QRS Duration: 168 QT Interval:  407 QTC Calculation: 512 R Axis:   -55 Text Interpretation:  Atrial fibrillation Left bundle branch block No  significant change since last tracing Confirmed by WChristy Gentles MD, Caelum Federici  [phone] on 06/24/2016 7:15:18 PM     Medications  ondansetron (ZOFRAN) injection 4 mg (4 mg Intravenous Given 06/24/16 1921)  sodium chloride 0.9 % bolus 1,000 mL (0 mLs Intravenous Stopped 06/24/16 2012)   sodium chloride 0.9 % bolus 500 mL (500 mLs Intravenous New Bag/Given 06/24/16 2135)  MDM   Final diagnoses:  Dehydration  Orthostatic hypotension    Nursing notes including past medical history and social history reviewed and considered in documentation Labs/vital reviewed myself and considered during evaluation     Ripley Fraise, MD 06/24/16 2324

## 2016-06-24 NOTE — Discharge Instructions (Signed)

## 2016-06-24 NOTE — ED Notes (Signed)
Pt complain of pain in right shoulder area. States he has a tumor there and it is pressing on nerves in his shoulder

## 2016-06-25 ENCOUNTER — Ambulatory Visit
Admission: RE | Admit: 2016-06-25 | Discharge: 2016-06-25 | Disposition: A | Discharge status: Home / Self Care | Payer: Medicare Other | Source: Ambulatory Visit | Attending: Radiation Oncology | Admitting: Radiation Oncology

## 2016-06-25 ENCOUNTER — Telehealth: Payer: Self-pay | Admitting: Radiation Oncology

## 2016-06-25 DIAGNOSIS — Z510 Encounter for antineoplastic radiation therapy: Secondary | ICD-10-CM | POA: Diagnosis not present

## 2016-06-25 NOTE — ED Notes (Signed)
Pt carried to car in wheelchair. Pt given discharge instructions on discharge.

## 2016-06-25 NOTE — Telephone Encounter (Signed)
Phoned patient to inquire about cancelled treatment appointment yesterday. No answer. Patient's wife immediately called back. She reports the patient had dry heaves, became weak and had another nose bleed so she took him to [facility] where "they gave him fluids." She confirms he is much better today and has plans of presenting for 1440 radiation treatment. Advised cardiologist wants to see her husband 2-3 weeks s/p completion of xrt to evaluate his pacemaker. George Pollard confirms she already has an appointment set for him to see his cardiologist on 8/14. She expressed appreciation for the call.

## 2016-06-26 ENCOUNTER — Ambulatory Visit
Admission: RE | Admit: 2016-06-26 | Discharge: 2016-06-26 | Disposition: A | Discharge status: Home / Self Care | Payer: Medicare Other | Source: Ambulatory Visit | Attending: Radiation Oncology | Admitting: Radiation Oncology

## 2016-06-26 DIAGNOSIS — Z510 Encounter for antineoplastic radiation therapy: Secondary | ICD-10-CM | POA: Diagnosis not present

## 2016-06-27 ENCOUNTER — Ambulatory Visit
Admission: RE | Admit: 2016-06-27 | Discharge: 2016-06-27 | Disposition: A | Discharge status: Home / Self Care | Payer: Medicare Other | Source: Ambulatory Visit | Attending: Radiation Oncology | Admitting: Radiation Oncology

## 2016-06-27 ENCOUNTER — Encounter (HOSPITAL_COMMUNITY): Payer: Self-pay | Admitting: Oncology

## 2016-06-27 ENCOUNTER — Inpatient Hospital Stay (HOSPITAL_COMMUNITY): Payer: Medicare Other

## 2016-06-27 ENCOUNTER — Encounter (HOSPITAL_BASED_OUTPATIENT_CLINIC_OR_DEPARTMENT_OTHER): Payer: Medicare Other

## 2016-06-27 ENCOUNTER — Encounter (HOSPITAL_BASED_OUTPATIENT_CLINIC_OR_DEPARTMENT_OTHER): Payer: Medicare Other | Admitting: Oncology

## 2016-06-27 VITALS — BP 87/51 | HR 80 | Temp 98.1°F | Resp 16 | Wt 206.3 lb

## 2016-06-27 DIAGNOSIS — I482 Chronic atrial fibrillation, unspecified: Secondary | ICD-10-CM

## 2016-06-27 DIAGNOSIS — R918 Other nonspecific abnormal finding of lung field: Secondary | ICD-10-CM

## 2016-06-27 DIAGNOSIS — C3411 Malignant neoplasm of upper lobe, right bronchus or lung: Secondary | ICD-10-CM

## 2016-06-27 DIAGNOSIS — I951 Orthostatic hypotension: Secondary | ICD-10-CM | POA: Diagnosis not present

## 2016-06-27 DIAGNOSIS — Z510 Encounter for antineoplastic radiation therapy: Secondary | ICD-10-CM | POA: Diagnosis not present

## 2016-06-27 DIAGNOSIS — G63 Polyneuropathy in diseases classified elsewhere: Secondary | ICD-10-CM

## 2016-06-27 DIAGNOSIS — C801 Malignant (primary) neoplasm, unspecified: Secondary | ICD-10-CM

## 2016-06-27 LAB — COMPREHENSIVE METABOLIC PANEL
ALK PHOS: 68 U/L (ref 38–126)
ALT: 35 U/L (ref 17–63)
ANION GAP: 6 (ref 5–15)
AST: 25 U/L (ref 15–41)
Albumin: 2.7 g/dL — ABNORMAL LOW (ref 3.5–5.0)
BILIRUBIN TOTAL: 1.6 mg/dL — AB (ref 0.3–1.2)
BUN: 16 mg/dL (ref 6–20)
CALCIUM: 8.4 mg/dL — AB (ref 8.9–10.3)
CO2: 28 mmol/L (ref 22–32)
Chloride: 103 mmol/L (ref 101–111)
Creatinine, Ser: 1.09 mg/dL (ref 0.61–1.24)
Glucose, Bld: 203 mg/dL — ABNORMAL HIGH (ref 65–99)
Potassium: 3.4 mmol/L — ABNORMAL LOW (ref 3.5–5.1)
Sodium: 137 mmol/L (ref 135–145)
TOTAL PROTEIN: 5.6 g/dL — AB (ref 6.5–8.1)

## 2016-06-27 LAB — CBC WITH DIFFERENTIAL/PLATELET
Basophils Absolute: 0 10*3/uL (ref 0.0–0.1)
Basophils Relative: 1 %
EOS ABS: 0 10*3/uL (ref 0.0–0.7)
Eosinophils Relative: 1 %
HEMATOCRIT: 26.7 % — AB (ref 39.0–52.0)
HEMOGLOBIN: 8.6 g/dL — AB (ref 13.0–17.0)
LYMPHS ABS: 0.4 10*3/uL — AB (ref 0.7–4.0)
Lymphocytes Relative: 11 %
MCH: 29.5 pg (ref 26.0–34.0)
MCHC: 32.2 g/dL (ref 30.0–36.0)
MCV: 91.4 fL (ref 78.0–100.0)
MONOS PCT: 8 %
Monocytes Absolute: 0.3 10*3/uL (ref 0.1–1.0)
NEUTROS PCT: 79 %
Neutro Abs: 2.7 10*3/uL (ref 1.7–7.7)
Platelets: 173 10*3/uL (ref 150–400)
RBC: 2.92 MIL/uL — ABNORMAL LOW (ref 4.22–5.81)
RDW: 18.2 % — ABNORMAL HIGH (ref 11.5–15.5)
WBC: 3.4 10*3/uL — ABNORMAL LOW (ref 4.0–10.5)

## 2016-06-27 LAB — PROTIME-INR
INR: 2.99 — AB (ref 0.00–1.49)
Prothrombin Time: 30.5 seconds — ABNORMAL HIGH (ref 11.6–15.2)

## 2016-06-27 MED ORDER — PROCHLORPERAZINE MALEATE 10 MG PO TABS: 10.0000 mg | ORAL_TABLET | Freq: Four times a day (QID) | ORAL | Status: DC | PRN

## 2016-06-27 MED ORDER — FENTANYL 50 MCG/HR TD PT72: 50.0000 ug | MEDICATED_PATCH | TRANSDERMAL | Status: AC

## 2016-06-27 MED ORDER — HEPARIN SOD (PORK) LOCK FLUSH 100 UNIT/ML IV SOLN
500.0000 [IU] | Freq: Once | INTRAVENOUS | Status: AC
  Administered 2016-06-27: 250 [IU] via INTRAVENOUS
  Filled 2016-06-27: qty 5

## 2016-06-27 MED ORDER — SODIUM CHLORIDE 0.9% FLUSH
10.0000 mL | INTRAVENOUS | Status: DC | PRN
  Administered 2016-06-27: 10 mL via INTRAVENOUS
  Filled 2016-06-27: qty 10

## 2016-06-27 MED ORDER — ONDANSETRON HCL 8 MG PO TABS: 8.0000 mg | ORAL_TABLET | Freq: Three times a day (TID) | ORAL | Status: DC | PRN

## 2016-06-27 MED ORDER — SODIUM CHLORIDE 0.9 % IV SOLN
INTRAVENOUS | Status: DC
  Administered 2016-06-27: 12:00:00 via INTRAVENOUS

## 2016-06-27 NOTE — Progress Notes (Addendum)
[facility], MD [place] [id number]  Pancoast tumor of right lung [facility]) - Plan: fentaNYL ([place] - DOSED MCG/HR) 50 MCG/HR  Chronic atrial fibrillation ([place]) - Plan: Protime-INR  Orthostatic hypotension - Plan: DISCONTINUED: 0.9 %  sodium chloride infusion  Neuropathy associated with malignant neoplasm (HCC) - Plan: fentaNYL (DURAGESIC - DOSED MCG/HR) 50 MCG/HR  Lung mass - Plan: ondansetron (ZOFRAN) 8 MG tablet, prochlorperazine (COMPAZINE) 10 MG tablet  CURRENT THERAPY: Concurrent chemoXRT consisting of weekly carboplatin/paclitaxel beginning on 05/23/2016. XRT is targeted at pancoast tumor and two LUL pulmonary nodules.  INTERVAL HISTORY: George Pollard 82 y.o. male returns for followup of right pancoast tumor, squamous cell histology with PET imaging on 05/10/2016 suggestive of two LUL pulmonary nodules.    Pancoast tumor of right lung ([place])   04/29/2016 Imaging Pancoast tumor extending from medial RUL into R posterior superior mediastinum. abuts and may invade esoph and post trachea, abuts central R subclavian artery, erosion of the R anterior aspects of the C7, T1 and T2 vertebra   05/03/2016 Pathology Results Not diagnostic   05/03/2016 Procedure Video bronchoscopy, Transbronchial ultrasound guided biopsy of 4R LN, mediastinoscopy with direct biopsy of 2R LN by Dr. Prescott Gum.   05/10/2016 PET scan intensely hypermetabolic RUL mass invading mediastinum c/w pancoast tumor. 2 LUL pulmonary nodules. Bilateral adrenal adenomas   05/17/2016 Procedure CT guided biopsy of right pancoast tumor by IR   05/20/2016 Pathology Results Lung, needle/core biopsy(ies), Right Upper Lobe SQUAMOUS CELL CARCINOMA   05/23/2016 -  Radiation Therapy XRT Dr. Tyler Pita.  Targeted at pancoast tumor and two LUL pulmonary nodules.   05/23/2016 -  Chemotherapy Weekly Carboplatin/Paclitaxel   06/27/2016 Treatment Plan Change Chemotherapy deferred x 7 days.  IV hydration provided.       Clinically, the patient is having difficulty tolerating treatment. He has physically declined its beginning treatment. He's had a few issues with regards to anterior nosebleeds in addition to development of pressure sores. Home health is assisting with evaluation and management of pressure sores. Physical therapy has been ordered.  He notes that his nosebleed occurred on Tuesday and resolved by Tuesday evening.  He notes that it is an anterior nosebleed.  He notes that it has not bleed since.    He reports that he is drinking about 30-40 oz of H2O daily.  He is encouraged that he needs to double this amount (at least) particularly given recent heat wave.  He notes improved pain control with treatment and current pain regimen.  His wife reports improved food intake.  She is making shakes with ensure or boost with the addition of a protein bar.     Review of Systems  Constitutional: Negative for fever, chills and weight loss.  HENT: Positive for nosebleeds.   Eyes: Negative.   Respiratory: Negative.   Cardiovascular: Negative.   Gastrointestinal: Negative.   Genitourinary: Negative.   Musculoskeletal: Negative.   Skin: Negative.   Neurological: Negative.   Endo/Heme/Allergies: Negative.   Psychiatric/Behavioral: Negative.     Past Medical History  Diagnosis Date  . CAD (coronary artery disease)     a. CABG 1997. b. NSTEMI (troponin >20) s/p PTCA/DES to LCx 10/21/12, overlapping DES to mid & distal RCA 10/26/12; c. 02/2016 MV: EF 37%, large fixed defect - apex, inf, septal, lat wall, no ischemia.  . Atrial fibrillation ([place])   . Hypertension   . BPH (benign prostatic hyperplasia)   . PVD (peripheral vascular  disease) ([place])     a. AAA repair 2001.  Marland Kitchen Systolic CHF ([place])     a. 10/2012: acute on chronic systolic CHF / pulmonary edema secondary to severe mixed cardiomyopathy;  b. 02/2016 Echo: EF 30-35%, sev dil RV, RV dysfxn, massively dil RA, PASP 58mHg.  . Diabetes mellitus ([place]      a. Noted 10/2012 (prior hx of DM resolved with weight loss).  . Ventricular tachycardia ([place]     a. 02/2016 4 ICD shocks for VT-->Amio added.  .Marland KitchenAICD (automatic cardioverter/defibrillator) present   . Shortness of breath dyspnea   . Anxiety   . Lung cancer ([place]     pancoast tumor right lung apex invading mediastinum and C7, T1, T2 with extension into the neural foramina and into the spinal canal  . Pancoast tumor of right lung ([place] 05/17/2016  . Cardiomyopathy ([place]   . H/O echocardiogram 02/2016    EF 30-35%  . Shoulder pain     Past Surgical History  Procedure Laterality Date  . Coronary artery bypass graft      1997  . Abdominal aortic aneurysm repair      08/26/2000  . Cataract extraction    . Tonsillectomy    . Left heart catheterization with coronary/graft angiogram N/A 10/21/2012    Procedure: LEFT HEART CATHETERIZATION WITH CBeatrix Fetters  Surgeon: CBurnell Blanks MD;  [facility];  Service: Cardiovascular;  Laterality: N/A;  . Percutaneous coronary stent intervention (pci-s) N/A 10/26/2012    Procedure: PERCUTANEOUS CORONARY STENT INTERVENTION (PCI-S);  Surgeon: MSherren Mocha MD;  [facility];  Service: Cardiovascular;  Laterality: N/A;  . Cardiac catheterization    . Mediastinoscopy N/A 05/03/2016    Procedure: MEDIASTINOSCOPY;  Surgeon: PIvin Poot MD;  [facility];  Service: Thoracic;  Laterality: N/A;  . Video bronchoscopy with endobronchial ultrasound N/A 05/03/2016    Procedure: VIDEO BRONCHOSCOPY WITH ENDOBRONCHIAL ULTRASOUND;  Surgeon: PIvin Poot MD;  [facility];  Service: Thoracic;  Laterality: N/A;    Family History  Problem Relation Age of Onset  . Valvular heart disease Daughter     MVP repair    Social History   Social History  . Marital Status: Married    Spouse Name: N/A  . Number of Children: 4  . Years of Education: N/A   Social History Main Topics  . Smoking status: Former Smoker --  2.00 packs/day for 20 years    Types: Cigarettes    Quit date: 12/17/1995  . Smokeless tobacco: Never Used     Comment: Quit 1997  . Alcohol Use: No  . Drug Use: No  . Sexual Activity: No   Other Topics Concern  . None   Social History Narrative   [place]  Lives with wife.       PHYSICAL EXAMINATION  ECOG PERFORMANCE STATUS: 2 - Symptomatic, <50% confined to bed  Filed Vitals:   06/27/16 0858  BP: 87/51  Pulse: 80  Temp: 98.1 F (36.7 C)  Resp: 16    GENERAL:alert, no distress, well developed, comfortable, cooperative, smiling and accompanied by daughter and wife.  In wheelchair. SKIN: skin color, texture, turgor are normal, no rashes or significant lesions, multiple ecchymoses on upper and lower extremities. HEAD: Normocephalic, No masses, lesions, tenderness or abnormalities EYES: normal, EOMI, Conjunctiva are pink and non-injected EARS: External ears normal OROPHARYNX:lips, buccal mucosa, and tongue normal and mucous membranes are moist  NECK: supple, trachea midline LYMPH:  not examined BREAST:not examined LUNGS: clear to auscultation and percussion, decreased breath sounds bilaterally HEART: regular rate & rhythm ABDOMEN:abdomen soft and normal bowel sounds BACK: Back symmetric, no curvature. EXTREMITIES:less then 2 second capillary refill, no joint deformities, effusion, or inflammation, no skin discoloration, no cyanosis  NEURO: alert & oriented x 3 with fluent speech, in wheelchair.   LABORATORY DATA: CBC    Component Value Date/Time   WBC 3.4* 06/27/2016 0915   RBC 2.92* 06/27/2016 0915   HGB 8.6* 06/27/2016 0915   HCT 26.7* 06/27/2016 0915   PLT 173 06/27/2016 0915   MCV 91.4 06/27/2016 0915   MCH 29.5 06/27/2016 0915   MCHC 32.2 06/27/2016 0915   RDW 18.2* 06/27/2016 0915   LYMPHSABS 0.4* 06/27/2016 0915   MONOABS 0.3 06/27/2016 0915   EOSABS 0.0 06/27/2016 0915   BASOSABS 0.0 06/27/2016 0915      Chemistry      Component Value  Date/Time   NA 137 06/27/2016 0915   K 3.4* 06/27/2016 0915   CL 103 06/27/2016 0915   CO2 28 06/27/2016 0915   BUN 16 06/27/2016 0915   CREATININE 1.09 06/27/2016 0915      Component Value Date/Time   CALCIUM 8.4* 06/27/2016 0915   ALKPHOS 68 06/27/2016 0915   AST 25 06/27/2016 0915   ALT 35 06/27/2016 0915   BILITOT 1.6* 06/27/2016 0915     Lab Results  Component Value Date   INR 2.99* 06/27/2016   INR 3.37* 06/24/2016   INR 2.39* 06/17/2016     PENDING LABS:   RADIOGRAPHIC STUDIES:  Dg Chest Portable 1 View  06/12/2016  CLINICAL DATA:  Bilateral lower extremity swelling, abdominal swelling and fatigue. History of lung cancer. EXAM: PORTABLE CHEST 1 VIEW COMPARISON:  05/20/2016. FINDINGS: Stable mild cardiac enlargement. Stable tortuosity and calcification of the thoracic aorta. Stable surgical changes from bypass surgery. The right ventricular pacer wire and right PICC line are stable. Stable right paratracheal soft tissue density. No acute overlying pulmonary process. No pleural effusions. The bony thorax is intact. IMPRESSION: No acute cardiopulmonary findings. Electronically Signed   By: Marijo Sanes M.D.   On: 06/12/2016 18:15   Dg Abd 2 Views  06/12/2016  CLINICAL DATA:  82 year old male with constipation and bilateral leg swelling. Abdominal pain. EXAM: ABDOMEN - 2 VIEW COMPARISON:  CT of the abdomen pelvis dated 04/29/2016 FINDINGS: Copious amount of dense stool noted throughout the colon. There is no bowel dilatation or evidence of obstruction. No free air. No radiopaque calculi. There is advanced osteopenia with extensive degenerative changes of the spine and scoliosis. No acute fracture. Multiple surgical clips noted in the upper abdomen. Bibasilar linear atelectasis/ scarring. A small right pleural effusion noted. There is median sternotomy wires and CABG vascular clips. Left pectoral AICD device. IMPRESSION: Constipation.  No bowel obstruction. Electronically Signed    By: Anner Crete M.D.   On: 06/12/2016 19:57     PATHOLOGY:    ASSESSMENT AND PLAN:  Pancoast tumor of right lung ([place]) Right pancoast tumor, squamous cell histology with PET imaging on 05/10/2016 suggestive of two LUL pulmonary nodules.  Currently undergoing concomitant chemoXRT consisting of weekly carboplatin/paclitaxel beginning on 05/23/2016.  Oncology history is updated.  Pre-treatment labs today: CBC diff, CMET.  PT/INR is added today.  INR in ED on 06/24/2016 is noted.  I personally reviewed and went over laboratory results with the patient.  The results are noted within this dictation.  HGB is low and therefore, we  will set him up for 1 unit PRBC.  His INR is therapeutic, but he continues to have issues with bleeding.  Even in the clinic during dressing change of PICC, bleeding was noted.  Upon leaving the clinic today, he developed a left olecranon skin tear leading to blood dripping down the cancer center hall wall requiring a dressing.  He is on vitamin K antagonist therapy secondary to chronic A-fibrillation.  He has 2 bedsores that were evaluated in the clinic on 06/20/2016.  Home Health was ordered for evaluation and treatment of these pressure sores.  Also, PT was ordered to assist with mobility, stamina, and strength.  I have signed additional Home Health orders today.  Chart reviewed.  Recent ED visit for "fatigue" on 06/24/2016 is noted.  He was treated with IV fluids with improvement and discharged from the ED.  Today we will defer chemotherapy x 1 week based upon his clinical situation.  He is hypotensive today and is on a number of cardiac medications given his strong cardiac history.  We will give IV fluids today at 175 cc/hr for 500 cc total today.  We have to support his hydration status gently given his cardiac history.  He will have radiation therapy today. Treatment plan is updated accordingly.  He is educated on increased PO hydration, particularly in light of  atmospheric heat and increased perspiration as a result.  He is only drinking ~40 oz of H2O per day and he is advised that he needs to double that amount.    He will return next week for follow-up and next cycle of chemotherapy.     ORDERS PLACED FOR THIS ENCOUNTER: Orders Placed This Encounter  Procedures  . Protime-INR    MEDICATIONS PRESCRIBED THIS ENCOUNTER: Meds ordered this encounter  Medications  . DISCONTD: 0.9 %  sodium chloride infusion    Sig:   . fentaNYL (DURAGESIC - DOSED MCG/HR) 50 MCG/HR    Sig: Place 1 patch (50 mcg total) onto the skin every 3 (three) days.    Dispense:  10 patch    Refill:  0    Order Specific Question:  Supervising Provider    Answer:  Patrici Ranks [id number]  . ondansetron (ZOFRAN) 8 MG tablet    Sig: Take 1 tablet (8 mg total) by mouth every 8 (eight) hours as needed for nausea or vomiting.    Dispense:  30 tablet    Refill:  2    Order Specific Question:  Supervising Provider    Answer:  Patrici Ranks [id number]  . prochlorperazine (COMPAZINE) 10 MG tablet    Sig: Take 1 tablet (10 mg total) by mouth every 6 (six) hours as needed for nausea or vomiting.    Dispense:  30 tablet    Refill:  2    Order Specific Question:  Supervising Provider    Answer:  Patrici Ranks [id number]    THERAPY PLAN:  Defer treatment today and provide IV hydration support.  All questions were answered. The patient knows to call the clinic with any problems, questions or concerns. We can certainly see the patient much sooner if necessary.  Patient and plan discussed with Dr. Ancil Linsey and she is in agreement with the aforementioned.   This note is electronically signed by: Doy Mince 06/27/2016 8:29 PM

## 2016-06-27 NOTE — Assessment & Plan Note (Addendum)
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  Currently undergoing concomitant chemoXRT consisting of weekly carboplatin/paclitaxel beginning on 05/23/2016.  Oncology history is updated.  Pre-treatment labs today: CBC diff, CMET.  PT/INR is added today.  INR in ED on 06/24/2016 is noted.  I personally reviewed and went over laboratory results with the patient.  The results are noted within this dictation.  HGB is low and therefore, we will set him up for 1 unit PRBC.  His INR is therapeutic, but he continues to have issues with bleeding.  Even in the clinic during dressing change of PICC, bleeding was noted.  Upon leaving the clinic today, he developed a left olecranon skin tear leading to blood dripping down the cancer center hall wall requiring a dressing.  He is on vitamin K antagonist therapy secondary to chronic A-fibrillation.  He has 2 bedsores that were evaluated in the clinic on 06/20/2016.  Home Health was ordered for evaluation and treatment of these pressure sores.  Also, PT was ordered to assist with mobility, stamina, and strength.  I have signed additional Home Health orders today.  Chart reviewed.  Recent ED visit for "fatigue" on 06/24/2016 is noted.  He was treated with IV fluids with improvement and discharged from the ED.  Today we will defer chemotherapy x 1 week based upon his clinical situation.  He is hypotensive today and is on a number of cardiac medications given his strong cardiac history.  We will give IV fluids today at 175 cc/hr for 500 cc total today.  We have to support his hydration status gently given his cardiac history.  He will have radiation therapy today. Treatment plan is updated accordingly.  He is educated on increased PO hydration, particularly in light of atmospheric heat and increased perspiration as a result.  He is only drinking ~40 oz of H2O per day and he is advised that he needs to double  that amount.    He will return next week for follow-up and next cycle of chemotherapy.

## 2016-06-27 NOTE — Progress Notes (Signed)
[name] presented for PICC line flush. PICC line located right upper arm Good blood return present. PICC line flushed with 48m NS and 250U/2.558mHeparin. Procedure without incident. Patient tolerated procedure well.

## 2016-06-27 NOTE — Addendum Note (Signed)
Addended by: Baird Cancer on: 06/27/2016 08:30 PM   Modules accepted: Level of Service

## 2016-06-27 NOTE — Patient Instructions (Signed)
[place] at [facility] Discharge Instructions  RECOMMENDATIONS MADE BY THE CONSULTANT AND ANY TEST RESULTS WILL BE SENT TO YOUR REFERRING PHYSICIAN.  Picc line dressing change today with labs. WE gave you fluids today. Follow up as scheduled. Call for any concerns or questions  Thank you for choosing [place] at [facility] to provide your oncology and hematology care.  To afford each patient quality time with our provider, please arrive at least 15 minutes before your scheduled appointment time.   Beginning January 23rd 2017 lab work for the [facility] will be done in the  Main lab at [facility] on 1st floor. If you have a lab appointment with the [facility] please come in thru the  Main Entrance and check in at the main information desk  You need to re-schedule your appointment should you arrive 10 or more minutes late.  We strive to give you quality time with our providers, and arriving late affects you and other patients whose appointments are after yours.  Also, if you no show three or more times for appointments you may be dismissed from the clinic at the providers discretion.     Again, thank you for choosing [facility].  Our hope is that these requests will decrease the amount of time that you wait before being seen by our physicians.       _____________________________________________________________  Should you have questions after your visit to [facility], please contact our office at [phone] between the hours of 8:30 a.m. and 4:30 p.m.  Voicemails left after 4:30 p.m. will not be returned until the following business day.  For prescription refill requests, have your pharmacy contact our office.         Resources For Cancer Patients and their Caregivers ? American Cancer Society: Can assist with transportation, wigs, general needs, runs Look Good Feel Better.         639-081-9380 ? Cancer Care: Provides financial assistance, online support groups, medication/co-pay assistance.  1-800-813-HOPE 6098068671) ? [place] Assists [place] cancer patients and their families through emotional , educational and financial support.  319-756-9364 ? [place] DSS Where to apply for food stamps, Medicaid and utility assistance. 934-003-5258 ? [facility]: Transportation to medical appointments. 217-483-0107 ? Social Security Administration: May apply for disability if have a Stage IV cancer. 431-519-7824 517-862-8559 ? [employer], Disability and Transit Services: Assists with nutrition, care and transit needs. [place] Support Programs: '@10RELATIVEDAYS'$ @ > Cancer Support Group  2nd Tuesday of the month 1pm-2pm, Journey Room  > Creative Journey  3rd Tuesday of the month 1130am-1pm, Journey Room  > Look Good Feel Better  1st Wednesday of the month 10am-12 noon, Journey Room (Call [place] to register [phone])

## 2016-06-28 ENCOUNTER — Encounter: Payer: Self-pay | Admitting: Radiation Oncology

## 2016-06-28 ENCOUNTER — Ambulatory Visit
Admission: RE | Admit: 2016-06-28 | Discharge: 2016-06-28 | Disposition: A | Discharge status: Home / Self Care | Payer: Medicare Other | Source: Ambulatory Visit | Attending: Radiation Oncology | Admitting: Radiation Oncology

## 2016-06-28 ENCOUNTER — Encounter (HOSPITAL_COMMUNITY): Payer: Medicare Other

## 2016-06-28 VITALS — BP 85/71 | HR 77 | Temp 97.8°F | Resp 18 | Wt 210.5 lb

## 2016-06-28 DIAGNOSIS — D649 Anemia, unspecified: Secondary | ICD-10-CM

## 2016-06-28 DIAGNOSIS — C3411 Malignant neoplasm of upper lobe, right bronchus or lung: Secondary | ICD-10-CM | POA: Diagnosis not present

## 2016-06-28 LAB — ABO/RH: ABO/RH(D): O NEG

## 2016-06-28 LAB — PREPARE RBC (CROSSMATCH)

## 2016-06-28 NOTE — Progress Notes (Signed)
Weight stable. BP low. Denies pain in his right should but, does report numbness and tingling in right hand. Reports a productive cough only in the morning to clear frothy sputum. Reports difficulty swallowing water but, no pain associated with swallowing. Hoarseness continues. No skin changes noted within treatment field. Reports using radiaplex as directed. Reports SOB with exertion. Wife confirms he fatigues easily. Scheduled to received blood with Dr. Whitney Muse on Monday.   BP 85/71 mmHg  Pulse 77  Temp(Src) 97.8 F (36.6 C) (Oral)  Resp 18  Wt 210 lb 8 oz (95.482 kg)  SpO2 96% Wt Readings from Last 3 Encounters:  06/28/16 210 lb 8 oz (95.482 kg)  06/27/16 206 lb 4.8 oz (93.577 kg)  06/24/16 205 lb (92.987 kg)

## 2016-06-28 NOTE — Progress Notes (Signed)
  Radiation Oncology         [phone]   Name: George Pollard MRN: [id number]   Date: 06/28/2016  DOB: November 11, 1933     Weekly Radiation Therapy Management    ICD-9-CM ICD-10-CM   1. Pancoast tumor of right lung (HCC) 162.3 C34.11     Current Dose: 48 Gy  Planned Dose:  66 Gy  Narrative The patient presents for routine under treatment assessment.  Weight stable. BP low. Denies pain in his right shoulder but, does report numbness and tingling in right hand - believes this is "a hair better". Reports a productive cough only in the morning to clear frothy sputum. Reports difficulty swallowing water but, no pain associated with swallowing. Hoarseness continues. No skin changes noted within treatment field. Reports using radiaplex as directed. Reports SOB with exertion. Wife confirms he fatigues easily. Scheduled to received blood with Dr. Whitney Muse on Monday. He did not receive chemotherapy treatment this week but did receive IV fluids twice.  Set-up films were reviewed. The chart was checked.  Physical Findings  weight is 210 lb 8 oz (95.482 kg). His oral temperature is 97.8 F (36.6 C). His blood pressure is 85/71 and his pulse is 77. His respiration is 18 and oxygen saturation is 96%. . Weight essentially stable.  Low BP noted. Low hemoglobin noted. Telangiectasia present on chest.   Impression The patient is tolerating radiation.  Plan Continue treatment as planned. The patient is scheduled to receive blood with Dr. Whitney Muse on Monday.         Sheral Apley Tammi Klippel, M.D.  This document serves as a record of services personally performed by Tyler Pita, MD. It was created on his behalf by Arlyce Harman, a trained medical scribe. The creation of this record is based on the scribe's personal observations and the provider's statements to them. This document has been checked and approved by the attending provider.

## 2016-06-30 ENCOUNTER — Encounter (HOSPITAL_COMMUNITY): Payer: Self-pay | Admitting: Hematology & Oncology

## 2016-07-01 ENCOUNTER — Ambulatory Visit: Admission: RE | Admit: 2016-07-01 | Payer: Medicare Other | Source: Ambulatory Visit

## 2016-07-01 ENCOUNTER — Ambulatory Visit
Admit: 2016-07-01 | Discharge: 2016-07-01 | Disposition: A | Discharge status: Home / Self Care | Payer: Medicare Other | Attending: Radiation Oncology | Admitting: Radiation Oncology

## 2016-07-01 ENCOUNTER — Other Ambulatory Visit: Payer: Self-pay

## 2016-07-01 ENCOUNTER — Encounter (HOSPITAL_COMMUNITY)
Admission: RE | Admit: 2016-07-01 | Discharge: 2016-07-01 | Disposition: A | Discharge status: Home / Self Care | Payer: Medicare Other | Source: Ambulatory Visit | Attending: Hematology & Oncology | Admitting: Hematology & Oncology

## 2016-07-01 ENCOUNTER — Inpatient Hospital Stay (HOSPITAL_COMMUNITY): Payer: Medicare Other

## 2016-07-01 ENCOUNTER — Emergency Department (HOSPITAL_COMMUNITY): Payer: Medicare Other

## 2016-07-01 ENCOUNTER — Telehealth: Payer: Self-pay | Admitting: Radiation Oncology

## 2016-07-01 ENCOUNTER — Encounter (HOSPITAL_COMMUNITY): Payer: Self-pay

## 2016-07-01 ENCOUNTER — Inpatient Hospital Stay (HOSPITAL_COMMUNITY)
Admission: EM | Admit: 2016-07-01 | Discharge: 2016-07-06 | DRG: 180 | Disposition: A | Discharge status: Home Health | Payer: Medicare Other | Attending: Family Medicine | Admitting: Family Medicine

## 2016-07-01 ENCOUNTER — Telehealth (HOSPITAL_COMMUNITY): Payer: Self-pay

## 2016-07-01 VITALS — BP 102/55 | HR 86 | Temp 98.7°F | Resp 18

## 2016-07-01 DIAGNOSIS — R040 Epistaxis: Secondary | ICD-10-CM | POA: Diagnosis not present

## 2016-07-01 DIAGNOSIS — I472 Ventricular tachycardia: Secondary | ICD-10-CM | POA: Diagnosis present

## 2016-07-01 DIAGNOSIS — C3411 Malignant neoplasm of upper lobe, right bronchus or lung: Secondary | ICD-10-CM | POA: Diagnosis present

## 2016-07-01 DIAGNOSIS — I25700 Atherosclerosis of coronary artery bypass graft(s), unspecified, with unstable angina pectoris: Secondary | ICD-10-CM | POA: Diagnosis not present

## 2016-07-01 DIAGNOSIS — C7972 Secondary malignant neoplasm of left adrenal gland: Secondary | ICD-10-CM | POA: Diagnosis present

## 2016-07-01 DIAGNOSIS — F419 Anxiety disorder, unspecified: Secondary | ICD-10-CM | POA: Diagnosis present

## 2016-07-01 DIAGNOSIS — Z66 Do not resuscitate: Secondary | ICD-10-CM | POA: Diagnosis present

## 2016-07-01 DIAGNOSIS — Z8249 Family history of ischemic heart disease and other diseases of the circulatory system: Secondary | ICD-10-CM

## 2016-07-01 DIAGNOSIS — Z951 Presence of aortocoronary bypass graft: Secondary | ICD-10-CM | POA: Diagnosis not present

## 2016-07-01 DIAGNOSIS — I429 Cardiomyopathy, unspecified: Secondary | ICD-10-CM | POA: Diagnosis present

## 2016-07-01 DIAGNOSIS — N400 Enlarged prostate without lower urinary tract symptoms: Secondary | ICD-10-CM | POA: Diagnosis present

## 2016-07-01 DIAGNOSIS — E1140 Type 2 diabetes mellitus with diabetic neuropathy, unspecified: Secondary | ICD-10-CM | POA: Diagnosis present

## 2016-07-01 DIAGNOSIS — R609 Edema, unspecified: Secondary | ICD-10-CM | POA: Diagnosis present

## 2016-07-01 DIAGNOSIS — N179 Acute kidney failure, unspecified: Secondary | ICD-10-CM | POA: Diagnosis present

## 2016-07-01 DIAGNOSIS — I959 Hypotension, unspecified: Secondary | ICD-10-CM | POA: Diagnosis present

## 2016-07-01 DIAGNOSIS — Z9861 Coronary angioplasty status: Secondary | ICD-10-CM

## 2016-07-01 DIAGNOSIS — K208 Other esophagitis: Secondary | ICD-10-CM | POA: Diagnosis present

## 2016-07-01 DIAGNOSIS — Z8679 Personal history of other diseases of the circulatory system: Secondary | ICD-10-CM

## 2016-07-01 DIAGNOSIS — I110 Hypertensive heart disease with heart failure: Secondary | ICD-10-CM | POA: Diagnosis present

## 2016-07-01 DIAGNOSIS — D61810 Antineoplastic chemotherapy induced pancytopenia: Secondary | ICD-10-CM | POA: Diagnosis present

## 2016-07-01 DIAGNOSIS — I482 Chronic atrial fibrillation, unspecified: Secondary | ICD-10-CM | POA: Diagnosis present

## 2016-07-01 DIAGNOSIS — Z833 Family history of diabetes mellitus: Secondary | ICD-10-CM

## 2016-07-01 DIAGNOSIS — R29818 Other symptoms and signs involving the nervous system: Secondary | ICD-10-CM

## 2016-07-01 DIAGNOSIS — Z7901 Long term (current) use of anticoagulants: Secondary | ICD-10-CM

## 2016-07-01 DIAGNOSIS — L8990 Pressure ulcer of unspecified site, unspecified stage: Secondary | ICD-10-CM | POA: Diagnosis present

## 2016-07-01 DIAGNOSIS — L8993 Pressure ulcer of unspecified site, stage 3: Secondary | ICD-10-CM | POA: Diagnosis present

## 2016-07-01 DIAGNOSIS — L89153 Pressure ulcer of sacral region, stage 3: Secondary | ICD-10-CM | POA: Diagnosis present

## 2016-07-01 DIAGNOSIS — N289 Disorder of kidney and ureter, unspecified: Secondary | ICD-10-CM

## 2016-07-01 DIAGNOSIS — Z7984 Long term (current) use of oral hypoglycemic drugs: Secondary | ICD-10-CM

## 2016-07-01 DIAGNOSIS — R339 Retention of urine, unspecified: Secondary | ICD-10-CM | POA: Diagnosis not present

## 2016-07-01 DIAGNOSIS — Y842 Radiological procedure and radiotherapy as the cause of abnormal reaction of the patient, or of later complication, without mention of misadventure at the time of the procedure: Secondary | ICD-10-CM | POA: Diagnosis present

## 2016-07-01 DIAGNOSIS — R29898 Other symptoms and signs involving the musculoskeletal system: Secondary | ICD-10-CM

## 2016-07-01 DIAGNOSIS — Z95810 Presence of automatic (implantable) cardiac defibrillator: Secondary | ICD-10-CM

## 2016-07-01 DIAGNOSIS — R918 Other nonspecific abnormal finding of lung field: Secondary | ICD-10-CM | POA: Diagnosis present

## 2016-07-01 DIAGNOSIS — N39498 Other specified urinary incontinence: Secondary | ICD-10-CM | POA: Diagnosis present

## 2016-07-01 DIAGNOSIS — E8809 Other disorders of plasma-protein metabolism, not elsewhere classified: Secondary | ICD-10-CM | POA: Diagnosis present

## 2016-07-01 DIAGNOSIS — I25810 Atherosclerosis of coronary artery bypass graft(s) without angina pectoris: Secondary | ICD-10-CM | POA: Diagnosis present

## 2016-07-01 DIAGNOSIS — C7971 Secondary malignant neoplasm of right adrenal gland: Secondary | ICD-10-CM | POA: Diagnosis present

## 2016-07-01 DIAGNOSIS — G8929 Other chronic pain: Secondary | ICD-10-CM | POA: Diagnosis present

## 2016-07-01 DIAGNOSIS — I255 Ischemic cardiomyopathy: Secondary | ICD-10-CM | POA: Diagnosis present

## 2016-07-01 DIAGNOSIS — R531 Weakness: Secondary | ICD-10-CM | POA: Diagnosis present

## 2016-07-01 DIAGNOSIS — Z7982 Long term (current) use of aspirin: Secondary | ICD-10-CM

## 2016-07-01 DIAGNOSIS — Z79899 Other long term (current) drug therapy: Secondary | ICD-10-CM

## 2016-07-01 DIAGNOSIS — C7951 Secondary malignant neoplasm of bone: Secondary | ICD-10-CM | POA: Diagnosis present

## 2016-07-01 DIAGNOSIS — D649 Anemia, unspecified: Secondary | ICD-10-CM | POA: Insufficient documentation

## 2016-07-01 DIAGNOSIS — I739 Peripheral vascular disease, unspecified: Secondary | ICD-10-CM | POA: Diagnosis present

## 2016-07-01 DIAGNOSIS — I252 Old myocardial infarction: Secondary | ICD-10-CM

## 2016-07-01 DIAGNOSIS — I5022 Chronic systolic (congestive) heart failure: Secondary | ICD-10-CM | POA: Diagnosis present

## 2016-07-01 DIAGNOSIS — Z820 Family history of epilepsy and other diseases of the nervous system: Secondary | ICD-10-CM

## 2016-07-01 DIAGNOSIS — T451X5A Adverse effect of antineoplastic and immunosuppressive drugs, initial encounter: Secondary | ICD-10-CM | POA: Diagnosis present

## 2016-07-01 DIAGNOSIS — R222 Localized swelling, mass and lump, trunk: Secondary | ICD-10-CM | POA: Diagnosis present

## 2016-07-01 DIAGNOSIS — Z87891 Personal history of nicotine dependence: Secondary | ICD-10-CM

## 2016-07-01 DIAGNOSIS — Z9181 History of falling: Secondary | ICD-10-CM

## 2016-07-01 DIAGNOSIS — R109 Unspecified abdominal pain: Secondary | ICD-10-CM

## 2016-07-01 HISTORY — DX: Non-ST elevation (NSTEMI) myocardial infarction: I21.4

## 2016-07-01 LAB — COMPREHENSIVE METABOLIC PANEL
ALBUMIN: 2.6 g/dL — AB (ref 3.5–5.0)
ALK PHOS: 69 U/L (ref 38–126)
ALT: 27 U/L (ref 17–63)
ANION GAP: 7 (ref 5–15)
AST: 21 U/L (ref 15–41)
BUN: 27 mg/dL — AB (ref 6–20)
CALCIUM: 8.4 mg/dL — AB (ref 8.9–10.3)
CO2: 26 mmol/L (ref 22–32)
Chloride: 103 mmol/L (ref 101–111)
Creatinine, Ser: 1.81 mg/dL — ABNORMAL HIGH (ref 0.61–1.24)
GFR calc Af Amer: 39 mL/min — ABNORMAL LOW (ref >60)
GFR calc non Af Amer: 34 mL/min — ABNORMAL LOW (ref >60)
GLUCOSE: 180 mg/dL — AB (ref 65–99)
POTASSIUM: 3.8 mmol/L (ref 3.5–5.1)
SODIUM: 136 mmol/L (ref 135–145)
Total Bilirubin: 2.4 mg/dL — ABNORMAL HIGH (ref 0.3–1.2)
Total Protein: 5.4 g/dL — ABNORMAL LOW (ref 6.5–8.1)

## 2016-07-01 LAB — BRAIN NATRIURETIC PEPTIDE: B NATRIURETIC PEPTIDE 5: 348 pg/mL — AB (ref 0.0–100.0)

## 2016-07-01 LAB — URINALYSIS, ROUTINE W REFLEX MICROSCOPIC
BILIRUBIN URINE: NEGATIVE
Glucose, UA: NEGATIVE mg/dL
Hgb urine dipstick: NEGATIVE
KETONES UR: NEGATIVE mg/dL
Leukocytes, UA: NEGATIVE
Nitrite: NEGATIVE
PROTEIN: NEGATIVE mg/dL
Specific Gravity, Urine: 1.02 (ref 1.005–1.030)
pH: 5.5 (ref 5.0–8.0)

## 2016-07-01 LAB — CBC WITH DIFFERENTIAL/PLATELET
BASOS ABS: 0 10*3/uL (ref 0.0–0.1)
BASOS PCT: 0 %
EOS ABS: 0 10*3/uL (ref 0.0–0.7)
Eosinophils Relative: 0 %
HCT: 27.2 % — ABNORMAL LOW (ref 39.0–52.0)
HEMOGLOBIN: 8.9 g/dL — AB (ref 13.0–17.0)
Lymphocytes Relative: 12 %
Lymphs Abs: 0.5 10*3/uL — ABNORMAL LOW (ref 0.7–4.0)
MCH: 29.5 pg (ref 26.0–34.0)
MCHC: 32.7 g/dL (ref 30.0–36.0)
MCV: 90.1 fL (ref 78.0–100.0)
MONOS PCT: 13 %
Monocytes Absolute: 0.6 10*3/uL (ref 0.1–1.0)
NEUTROS PCT: 75 %
Neutro Abs: 3.3 10*3/uL (ref 1.7–7.7)
Platelets: 152 10*3/uL (ref 150–400)
RBC: 3.02 MIL/uL — ABNORMAL LOW (ref 4.22–5.81)
RDW: 18.8 % — ABNORMAL HIGH (ref 11.5–15.5)
WBC: 4.5 10*3/uL (ref 4.0–10.5)

## 2016-07-01 LAB — TROPONIN I

## 2016-07-01 LAB — PROTIME-INR
INR: 1.43 (ref 0.00–1.49)
Prothrombin Time: 17.6 seconds — ABNORMAL HIGH (ref 11.6–15.2)

## 2016-07-01 LAB — CBG MONITORING, ED: Glucose-Capillary: 217 mg/dL — ABNORMAL HIGH (ref 65–99)

## 2016-07-01 LAB — POC OCCULT BLOOD, ED: Fecal Occult Bld: NEGATIVE

## 2016-07-01 MED ORDER — ATORVASTATIN CALCIUM 40 MG PO TABS
80.0000 mg | ORAL_TABLET | Freq: Every day | ORAL | Status: DC
  Administered 2016-07-03 – 2016-07-06 (×4): 80 mg via ORAL
  Filled 2016-07-01 (×2): qty 2
  Filled 2016-07-01: qty 1
  Filled 2016-07-01: qty 2

## 2016-07-01 MED ORDER — ASPIRIN EC 81 MG PO TBEC
81.0000 mg | DELAYED_RELEASE_TABLET | Freq: Every day | ORAL | Status: DC
  Administered 2016-07-03 – 2016-07-06 (×4): 81 mg via ORAL
  Filled 2016-07-01 (×4): qty 1

## 2016-07-01 MED ORDER — NITROGLYCERIN 0.4 MG SL SUBL: 0.4000 mg | SUBLINGUAL_TABLET | SUBLINGUAL | Status: DC | PRN

## 2016-07-01 MED ORDER — POTASSIUM CHLORIDE CRYS ER 20 MEQ PO TBCR
20.0000 meq | EXTENDED_RELEASE_TABLET | Freq: Every day | ORAL | Status: DC
  Administered 2016-07-03 – 2016-07-06 (×4): 20 meq via ORAL
  Filled 2016-07-01 (×4): qty 1

## 2016-07-01 MED ORDER — SODIUM CHLORIDE 0.9 % IV SOLN
INTRAVENOUS | Status: DC
  Administered 2016-07-02: 01:00:00 via INTRAVENOUS

## 2016-07-01 MED ORDER — ACETAMINOPHEN 325 MG PO TABS: 650.0000 mg | ORAL_TABLET | Freq: Four times a day (QID) | ORAL | Status: DC | PRN

## 2016-07-01 MED ORDER — HEPARIN LOCK FLUSH 100 UNIT/ML IV SOLN: 2.5000 mL | INTRAVENOUS | Status: DC

## 2016-07-01 MED ORDER — SUCRALFATE 1 G PO TABS
1.0000 g | ORAL_TABLET | Freq: Three times a day (TID) | ORAL | Status: DC
  Administered 2016-07-02 – 2016-07-06 (×16): 1 g via ORAL
  Filled 2016-07-01 (×16): qty 1

## 2016-07-01 MED ORDER — NORMAL SALINE FLUSH 0.9 % IV SOLN: 10.0000 mL | INTRAVENOUS | Status: DC

## 2016-07-01 MED ORDER — SODIUM CHLORIDE 0.9% FLUSH: 10.0000 mL | INTRAVENOUS | Status: DC | PRN

## 2016-07-01 MED ORDER — DEXAMETHASONE SODIUM PHOSPHATE 4 MG/ML IJ SOLN
10.0000 mg | Freq: Once | INTRAMUSCULAR | Status: AC
  Administered 2016-07-01: 10 mg via INTRAVENOUS
  Filled 2016-07-01: qty 3

## 2016-07-01 MED ORDER — METOPROLOL TARTRATE 25 MG PO TABS
75.0000 mg | ORAL_TABLET | Freq: Two times a day (BID) | ORAL | Status: DC
  Administered 2016-07-02 – 2016-07-06 (×9): 75 mg via ORAL
  Filled 2016-07-01 (×9): qty 3

## 2016-07-01 MED ORDER — SODIUM CHLORIDE 0.9 % IV BOLUS (SEPSIS)
250.0000 mL | Freq: Once | INTRAVENOUS | Status: AC
  Administered 2016-07-01: 250 mL via INTRAVENOUS

## 2016-07-01 MED ORDER — BOOST PLUS PO LIQD
1.0000 | Freq: Two times a day (BID) | ORAL | Status: DC
  Administered 2016-07-02: 237 mL via ORAL
  Filled 2016-07-01 (×4): qty 237

## 2016-07-01 MED ORDER — GABAPENTIN 300 MG PO CAPS
300.0000 mg | ORAL_CAPSULE | Freq: Every day | ORAL | Status: DC
  Administered 2016-07-02 – 2016-07-05 (×5): 300 mg via ORAL
  Filled 2016-07-01 (×5): qty 1

## 2016-07-01 MED ORDER — ACETAMINOPHEN 325 MG PO TABS
650.0000 mg | ORAL_TABLET | Freq: Once | ORAL | Status: AC
  Administered 2016-07-01: 650 mg via ORAL
  Filled 2016-07-01: qty 2

## 2016-07-01 MED ORDER — HYDROCODONE-ACETAMINOPHEN 10-325 MG PO TABS: 1.0000 | ORAL_TABLET | ORAL | Status: DC | PRN

## 2016-07-01 MED ORDER — ONDANSETRON HCL 4 MG PO TABS: 8.0000 mg | ORAL_TABLET | Freq: Three times a day (TID) | ORAL | Status: DC | PRN

## 2016-07-01 MED ORDER — DIPHENHYDRAMINE HCL 25 MG PO CAPS
25.0000 mg | ORAL_CAPSULE | Freq: Once | ORAL | Status: AC
  Administered 2016-07-01: 25 mg via ORAL
  Filled 2016-07-01: qty 1

## 2016-07-01 MED ORDER — POTASSIUM BICARB-CITRIC ACID 20 MEQ PO TBEF: 20.0000 meq | EFFERVESCENT_TABLET | Freq: Every day | ORAL | Status: DC

## 2016-07-01 MED ORDER — AMIODARONE HCL 200 MG PO TABS
200.0000 mg | ORAL_TABLET | Freq: Two times a day (BID) | ORAL | Status: DC
  Administered 2016-07-02 – 2016-07-06 (×9): 200 mg via ORAL
  Filled 2016-07-01 (×9): qty 1

## 2016-07-01 MED ORDER — BIOTENE DRY MOUTH MT LIQD: 15.0000 mL | OROMUCOSAL | Status: DC | PRN

## 2016-07-01 MED ORDER — DIATRIZOATE MEGLUMINE & SODIUM 66-10 % PO SOLN
ORAL | Status: AC
  Filled 2016-07-01: qty 30

## 2016-07-01 MED ORDER — HEPARIN SOD (PORK) LOCK FLUSH 100 UNIT/ML IV SOLN
250.0000 [IU] | INTRAVENOUS | Status: DC | PRN
  Filled 2016-07-01: qty 5

## 2016-07-01 MED ORDER — ACETAMINOPHEN 650 MG RE SUPP: 650.0000 mg | Freq: Four times a day (QID) | RECTAL | Status: DC | PRN

## 2016-07-01 MED ORDER — WARFARIN - PHARMACIST DOSING INPATIENT: Freq: Every day | Status: DC

## 2016-07-01 MED ORDER — HYDROCODONE-ACETAMINOPHEN 5-325 MG PO TABS
1.0000 | ORAL_TABLET | Freq: Once | ORAL | Status: AC
  Administered 2016-07-01: 1 via ORAL
  Filled 2016-07-01: qty 1

## 2016-07-01 MED ORDER — PROCHLORPERAZINE MALEATE 10 MG PO TABS
10.0000 mg | ORAL_TABLET | Freq: Four times a day (QID) | ORAL | Status: DC | PRN
  Filled 2016-07-01: qty 1

## 2016-07-01 MED ORDER — DEXAMETHASONE SODIUM PHOSPHATE 10 MG/ML IJ SOLN
10.0000 mg | Freq: Four times a day (QID) | INTRAMUSCULAR | Status: DC
  Administered 2016-07-02 – 2016-07-03 (×5): 10 mg via INTRAVENOUS
  Filled 2016-07-01 (×7): qty 1

## 2016-07-01 MED ORDER — SODIUM CHLORIDE 0.9 % IV SOLN
250.0000 mL | Freq: Once | INTRAVENOUS | Status: AC
  Administered 2016-07-01: 250 mL via INTRAVENOUS

## 2016-07-01 MED ORDER — SODIUM CHLORIDE 0.9% FLUSH
3.0000 mL | Freq: Two times a day (BID) | INTRAVENOUS | Status: DC
  Administered 2016-07-02 – 2016-07-05 (×2): 3 mL via INTRAVENOUS

## 2016-07-01 MED ORDER — INSULIN ASPART 100 UNIT/ML ~~LOC~~ SOLN
0.0000 [IU] | Freq: Every day | SUBCUTANEOUS | Status: DC
  Administered 2016-07-02: 3 [IU] via SUBCUTANEOUS

## 2016-07-01 MED ORDER — INSULIN ASPART 100 UNIT/ML ~~LOC~~ SOLN: 0.0000 [IU] | Freq: Three times a day (TID) | SUBCUTANEOUS | Status: DC

## 2016-07-01 MED ORDER — WARFARIN SODIUM 5 MG PO TABS: 5.0000 mg | ORAL_TABLET | Freq: Once | ORAL | Status: DC

## 2016-07-01 NOTE — ED Notes (Signed)
Unable to obtain vital signs. Patient unable to stand.

## 2016-07-01 NOTE — ED Notes (Signed)
Dr. Melanee Left made aware of hypotension. New orders given.

## 2016-07-01 NOTE — Progress Notes (Signed)
Report called to triage nurse of ED. Will transport via stretcher after unit of blood. Family at bedside and will accompany.

## 2016-07-01 NOTE — Telephone Encounter (Signed)
Phoned patient's wife to inform her we are aware her husband is at [facility] in the ED. No answer. No option to leave a message because voicemail is full. Spoke with Eliezer Lofts, RT and cancelled xrt for today.

## 2016-07-01 NOTE — Telephone Encounter (Signed)
Patient's wife came up today and asked what was next for her husband after he gets his blood. Wife reports that he cannot walk, he is weeping thru his skin all over. He is incontinent of bowel and bladder.  Dr.Penland notified. I told the wife that she needs to take him to the Emergency room after he gets done with his blood transfusion.

## 2016-07-01 NOTE — ED Notes (Signed)
Patient turned to side and prepositioned with pillows.

## 2016-07-01 NOTE — ED Provider Notes (Signed)
CSN: [id number]     Arrival date & time 07/01/16  1207 History  By signing my name below, I, Reola Mosher, attest that this documentation has been prepared under the direction and in the presence of Ezequiel Essex, MD. Electronically Signed: Reola Mosher, ED Scribe. 07/01/2016. 1:05 PM.   Chief Complaint  Patient presents with  . Extremity Weakness   HPI HPI Comments: George Pollard is a 82 y.o. male with a PMHx of CAD, HTN, lung cancer, CHF, DM, AICD, and Cardiomyopathy who presents to the Emergency Department complaining of gradual onset, gradually worsening, constant lower extremity weakness x 1 week. He has associated intermittent episodes of epistaxis, bowel/bladder incontinence, mild SOB, and tingling/numbness to his bilateral lower extremities. He was seen this morning for a blood transfusion prior to coming into the ED. Pt reports that for the past several days that he has had difficulty getting up from sitting, and with walking. However, this morning prior to coming into the ED he was was unable to get up from sitting at all. He notes that because of his weakness he has fallen several times over the past week. No head injury or LOC in any of the falls. Pt normally ambulates with the assistance of a walker. His last bowel movement was this morning PTA, and they have been "firm enough" per daughter. Otherwise normal bowel output. Decreased urine output since the onset of his symptoms. Pt is on Coumadin, but has not taken it in the last week per his PCPs advice. Pt was diagnosed with Pancoast tumor of the right lung approximately 2 months PTA. Pt notes that he has chronic shoulder pain since his dx of lung cancer. Pt receives radiation therapy every day, and his last chemotherapy treatment was 11 days PTA. Denies CP, back pain, bloody stools, abdominal pain, loss of appetite, or new pain in any extremities.   Past Medical History  Diagnosis Date  . CAD (coronary artery disease)      a. CABG 1997. b. NSTEMI (troponin >20) s/p PTCA/DES to LCx 10/21/12, overlapping DES to mid & distal RCA 10/26/12; c. 02/2016 MV: EF 37%, large fixed defect - apex, inf, septal, lat wall, no ischemia.  . Atrial fibrillation ([place])   . Hypertension   . BPH (benign prostatic hyperplasia)   . PVD (peripheral vascular disease) ([place])     a. AAA repair 2001.  Marland Kitchen Systolic CHF ([place])     a. 10/2012: acute on chronic systolic CHF / pulmonary edema secondary to severe mixed cardiomyopathy;  b. 02/2016 Echo: EF 30-35%, sev dil RV, RV dysfxn, massively dil RA, PASP 13mHg.  . Diabetes mellitus ([place]     a. Noted 10/2012 (prior hx of DM resolved with weight loss).  . Ventricular tachycardia ([place]     a. 02/2016 4 ICD shocks for VT-->Amio added.  .Marland KitchenAICD (automatic cardioverter/defibrillator) present   . Shortness of breath dyspnea   . Anxiety   . Lung cancer ([place]     pancoast tumor right lung apex invading mediastinum and C7, T1, T2 with extension into the neural foramina and into the spinal canal  . [place]ast [place] of right lung [place] 05/17/2016  . Cardiomyopathy ([place]   . H/O echocardiogram 02/2016    EF 30-35%  . Shoulder pain    Past Surgical History  Procedure Laterality Date  . Coronary artery bypass graft      1997  . Abdominal aortic aneurysm repair      08/26/2000  .  Cataract extraction    . Tonsillectomy    . Left heart catheterization with coronary/graft angiogram N/A 10/21/2012    Procedure: LEFT HEART CATHETERIZATION WITH Beatrix Fetters;  Surgeon: Burnell Blanks, MD;  Location: [facility] CATH LAB;  Service: Cardiovascular;  Laterality: N/A;  . Percutaneous coronary stent intervention (pci-s) N/A 10/26/2012    Procedure: PERCUTANEOUS CORONARY STENT INTERVENTION (PCI-S);  Surgeon: Sherren Mocha, MD;  Location: [facility] CATH LAB;  Service: Cardiovascular;  Laterality: N/A;  . Cardiac catheterization    . Mediastinoscopy N/A 05/03/2016    Procedure: MEDIASTINOSCOPY;  Surgeon: Ivin Poot, MD;  Location: [facility] OR;  Service: Thoracic;  Laterality: N/A;  . Video bronchoscopy with endobronchial ultrasound N/A 05/03/2016    Procedure: VIDEO BRONCHOSCOPY WITH ENDOBRONCHIAL ULTRASOUND;  Surgeon: Ivin Poot, MD;  Location: [facility] OR;  Service: Thoracic;  Laterality: N/A;   Family History  Problem Relation Age of Onset  . Valvular heart disease Daughter     MVP repair   Social History  Substance Use Topics  . Smoking status: Former Smoker -- 2.00 packs/day for 20 years    Types: Cigarettes    Quit date: 12/17/1995  . Smokeless tobacco: Never Used     Comment: Quit 1997  . Alcohol Use: No    Review of Systems A complete 10 system review of systems was obtained and all systems are negative except as noted in the HPI and PMH.   Allergies  Review of patient's allergies indicates no known allergies.  Home Medications   Prior to Admission medications   Medication Sig Start Date End Date Taking? Authorizing Provider  amiodarone (PACERONE) 200 MG tablet Take 1 tablet by mouth 2 (two) times daily. 03/26/16   Historical Provider, MD  aspirin EC 81 MG tablet Take 81 mg by mouth daily.    Historical Provider, MD  atorvastatin (LIPITOR) 80 MG tablet take 1 tablet by mouth once daily 12/18/12   Jolaine Artist, MD  CARBOPLATIN IV Inject into the vein. To be given weekly with radiation    Historical Provider, MD  feeding supplement (BOOST HIGH PROTEIN) LIQD Take 1 Container by mouth 2 (two) times daily. Chocolate/Strawberry    Historical Provider, MD  fentaNYL (DURAGESIC - DOSED MCG/HR) 50 MCG/HR Place 1 patch (50 mcg total) onto the skin every 3 (three) days. 06/27/16   Baird Cancer, PA-C  furosemide (LASIX) 40 MG tablet Take 1 tablet (40 mg total) by mouth 2 (two) times daily. Patient taking differently: Take 40 mg by mouth 3 (three) times daily.  04/05/16   Evans Lance, MD  gabapentin (NEURONTIN) 300 MG capsule Take 1 capsule (300 mg total) by mouth at bedtime. 05/30/16    Patrici Ranks, MD  Heparin Lock Flush (HEPARIN FLUSH, PORCINE,) 100 UNIT/ML injection Flush PICC line twice a week. First flush with Saline. Then Flush PICC line with 2.4m of Heparin. 05/28/16   SPatrici Ranks MD  HYDROcodone-acetaminophen (NORCO) 10-325 MG tablet Take 1 tablet by mouth every 4 (four) hours as needed. Patient taking differently: Take 1 tablet by mouth every 4 (four) hours as needed. For pain 06/06/16   TBaird Cancer PA-C  lisinopril (PRINIVIL,ZESTRIL) 2.5 MG tablet take 1 tablet by mouth once daily 12/18/12   DJolaine Artist MD  metFORMIN (GLUCOPHAGE) 500 MG tablet Take 500 mg by mouth daily.  01/22/16   Historical Provider, MD  Metoprolol Tartrate (LOPRESSOR) 50 MG tablet Take 1.5 tablets (75 mg total) by mouth 2 (two)  times daily. 03/01/16   Rogelia Mire, NP  nitroGLYCERIN (NITROSTAT) 0.4 MG SL tablet Place 1 tablet (0.4 mg total) under the tongue every 5 (five) minutes as needed for chest pain (up to 3 doses). 10/29/12   Dayna N Dunn, PA-C  ondansetron (ZOFRAN) 8 MG tablet Take 1 tablet (8 mg total) by mouth every 8 (eight) hours as needed for nausea or vomiting. 06/27/16   Baird Cancer, PA-C  PACLitaxel (TAXOL IV) Inject into the vein. To be given weekly with radiation    Historical Provider, MD  Potassium Bicarb-Citric Acid 20 MEQ TBEF Take 1 tablet (20 mEq total) by mouth daily. 06/13/16   Patrici Ranks, MD  prochlorperazine (COMPAZINE) 10 MG tablet Take 1 tablet (10 mg total) by mouth every 6 (six) hours as needed for nausea or vomiting. 06/27/16   Baird Cancer, PA-C  Sodium Chloride Flush (NORMAL SALINE FLUSH) 0.9 % SOLN Flush PICC line twice a week with 45m prior to flushing with Heparin. 05/20/16   TBaird Cancer PA-C  sucralfate (CARAFATE) 1 g tablet Take 1 tablet (1 g total) by mouth 4 (four) times daily -  with meals and at bedtime. 5 min before meals for radiation induced esophagitis 06/07/16   MTyler Pita MD  warfarin (COUMADIN) 5 MG  tablet Take 1 tablet (5 mg total) by mouth daily at 6 PM. Patient not taking: Reported on 06/28/2016 03/01/16   CRogelia Mire NP   Ht '5\' 10"'$  (1.778 m)  Wt 205 lb (92.987 kg)  BMI 29.41 kg/m2   Physical Exam  Constitutional: He is oriented to person, place, and time. He appears well-developed and well-nourished.  Chronically ill appearing.  HENT:  Head: Normocephalic and atraumatic.  Dry mucous membranes.   Eyes: EOM are normal.  Neck: Normal range of motion.  Cardiovascular: Normal rate, regular rhythm, normal heart sounds and intact distal pulses.   Pulmonary/Chest: Effort normal and breath sounds normal. No respiratory distress.  Pt has a pacemaker and defibrillator on the left.   Abdominal: Soft. He exhibits distension.  Distended abdomen with questionable mass on the right side.   Genitourinary:  Upon rectal exam: Brown stool, normal rectal tone.   Musculoskeletal:  +2 edema to knees bilaterally. Intact DP and PT pulses. 5/5 strength of LEs. Great toe extension intact. 4/5 strength of right arm which is chronic.   Neurological: He is alert and oriented to person, place, and time.  Skin: Skin is warm and dry.  Psychiatric: He has a normal mood and affect. Judgment normal.  Nursing note and vitals reviewed.  ED Course  Procedures (including critical care time) DIAGNOSTIC STUDIES: Oxygen Saturation is 97% on RA, normal by my interpretation.   COORDINATION OF CARE: 12:47 PM-Discussed next steps with pt. Pt verbalized understanding and is agreeable with the plan.   Labs Review Labs Reviewed  CBC WITH DIFFERENTIAL/PLATELET - Abnormal; Notable for the following:    RBC 3.02 (*)    Hemoglobin 8.9 (*)    HCT 27.2 (*)    RDW 18.8 (*)    Lymphs Abs 0.5 (*)    All other components within normal limits  COMPREHENSIVE METABOLIC PANEL - Abnormal; Notable for the following:    Glucose, Bld 180 (*)    BUN 27 (*)    Creatinine, Ser 1.81 (*)    Calcium 8.4 (*)    Total  Protein 5.4 (*)    Albumin 2.6 (*)    Total Bilirubin 2.4 (*)  GFR calc non Af Amer 34 (*)    GFR calc Af Amer 39 (*)    All other components within normal limits  PROTIME-INR - Abnormal; Notable for the following:    Prothrombin Time 17.6 (*)    All other components within normal limits  BRAIN NATRIURETIC PEPTIDE - Abnormal; Notable for the following:    B Natriuretic Peptide 348.0 (*)    All other components within normal limits  CBG MONITORING, ED - Abnormal; Notable for the following:    Glucose-Capillary 217 (*)    All other components within normal limits  MRSA PCR SCREENING  TROPONIN I  URINALYSIS, ROUTINE W REFLEX MICROSCOPIC (NOT AT [facility])  SODIUM, URINE, RANDOM  CALCIUM / CREATININE RATIO, URINE  TROPONIN I  HEMOGLOBIN A1C  CBC WITH DIFFERENTIAL/PLATELET  COMPREHENSIVE METABOLIC PANEL  PROTIME-INR  POC OCCULT BLOOD, ED  CYTOLOGY - NON PAP    Imaging Review Ct Abdomen Pelvis Wo Contrast  07/01/2016  CLINICAL DATA:  Lower extremity weakness. Cancer patient receiving chemotherapy and radiation therapy. EXAM: CT ABDOMEN WITHOUT CONTRAST CT PELVIS WITHOUt CONTRAST CT THORACIC SPINE CT LUMBAR SPINE TECHNIQUE: Multidetector CT imaging of the abdomen was performed using the standard protocol. Multidetector CT imaging of the pelvis was performed following the standard protocol without intravenous contrast. Using the CT data thoracic and lumbar spine examinations or degenerated. COMPARISON:  CT SCAN 04/29/2016 FINDINGS: Lower chest: Streaky bibasilar atelectasis, right greater than left. The heart is normal in size for age. No pericardial effusion. Pacer wires are noted. Coronary artery calcifications. Hepatobiliary: No focal hepatic lesions or intrahepatic biliary dilatation. Layering small gallstones nerve gallbladder. No common bile duct dilatation. Pancreas: No mass, inflammation or ductal dilatation. Spleen: Normal size.  No focal lesions. Adrenals/Urinary Tract: Bilateral  adrenal gland metastasis are again demonstrated. Renal cysts are noted but no worrisome renal lesions or obstructing ureteral calculi. Chronic right UPJ obstruction. The bladder is decompressed by Foley catheter. Stomach/Bowel: The stomach, duodenum, small bowel and colon are grossly normal. No inflammatory changes, mass lesions or obstructive findings. The terminal ileum is normal. The appendix is normal. Vascular/Lymphatic: Advanced atherosclerotic calcifications involving the aorta and branch vessels. No mesenteric or retroperitoneal mass or lymphadenopathy. Other: No pelvic mass or adenopathy. No pre pelvic fluid collections. No inguinal mass or adenopathy. Musculoskeletal: No significant bony findings. THORACIC SPINE: Large right apical lung mass invading the chest wall and directly involving the first right and second ribs which are destroyed. There is also tumor destroying the right aspect of T1, T2 and T3. Pathologic compression fracture of T2. Mild canal compromise is also suspected but MR is recommended for further evaluation. A left upper lobe lung lesion is noted along with severe emphysematous changes. Advanced atherosclerotic calcifications involving the aorta but no aneurysm. Lumbar spine: The advanced degenerative lumbar spondylosis with multilevel disc disease and facet disease. T12 compression fracture is noted. No lumbar compression fractures. No obvious destructive bone metastasis. Multilevel disc disease and facet disease. IMPRESSION: 1. Large right apical/paraspinal mass directly invading the T1, T2 and T3 vertebral bodies on the right side and also destroying the right first and second ribs. There is canal encroachment at T2 but further evaluation with thoracic spine MRI without and with contrast suggested. 2. Bibasilar atelectasis, lung lesions and severe emphysema. 3. Bilateral adrenal gland metastasis. 4. Advanced atherosclerotic calcifications involving the aorta iliac arteries. 5. Remote  compression fracture of T12. 6. No acute abdominal/pelvic findings. Electronically Signed   By: Ricky Stabs.D.  On: 07/01/2016 16:08   Ct Head Wo Contrast  07/01/2016  CLINICAL DATA:  Lower extremity weakness. Cancer patient receiving chemotherapy and radiation therapy. History of lung cancer. EXAM: CT HEAD WITHOUT CONTRAST TECHNIQUE: Contiguous axial images were obtained from the base of the skull through the vertex without intravenous contrast. COMPARISON:  None. FINDINGS: Brain: There is mild generalized age related parenchymal atrophy with commensurate dilatation of the ventricles and sulci. Mild chronic small vessel ischemic changes noted in the bilateral periventricular white matter regions. There is no mass, hemorrhage, edema or other evidence of acute parenchymal abnormality. No extra-axial hemorrhage. Vascular: No hyperdense vessel or unexpected calcification. There are chronic calcified atherosclerotic changes of the large vessels at the skull base. Skull: Negative for fracture or focal lesion. Sinuses/Orbits: No acute findings. Other: None. IMPRESSION: 1. No acute intracranial findings. No intracranial mass, hemorrhage or edema. 2. Chronic ischemic changes in the white matter. Electronically Signed   By: Franki Cabot M.D.   On: 07/01/2016 19:21   Ct Thoracic Spine Wo Contrast  07/01/2016  CLINICAL DATA:  Lower extremity weakness. Cancer patient receiving chemotherapy and radiation therapy. EXAM: CT ABDOMEN WITHOUT CONTRAST CT PELVIS WITHOUt CONTRAST CT THORACIC SPINE CT LUMBAR SPINE TECHNIQUE: Multidetector CT imaging of the abdomen was performed using the standard protocol. Multidetector CT imaging of the pelvis was performed following the standard protocol without intravenous contrast. Using the CT data thoracic and lumbar spine examinations or degenerated. COMPARISON:  CT SCAN 04/29/2016 FINDINGS: Lower chest: Streaky bibasilar atelectasis, right greater than left. The heart is normal in  size for age. No pericardial effusion. Pacer wires are noted. Coronary artery calcifications. Hepatobiliary: No focal hepatic lesions or intrahepatic biliary dilatation. Layering small gallstones nerve gallbladder. No common bile duct dilatation. Pancreas: No mass, inflammation or ductal dilatation. Spleen: Normal size.  No focal lesions. Adrenals/Urinary Tract: Bilateral adrenal gland metastasis are again demonstrated. Renal cysts are noted but no worrisome renal lesions or obstructing ureteral calculi. Chronic right UPJ obstruction. The bladder is decompressed by Foley catheter. Stomach/Bowel: The stomach, duodenum, small bowel and colon are grossly normal. No inflammatory changes, mass lesions or obstructive findings. The terminal ileum is normal. The appendix is normal. Vascular/Lymphatic: Advanced atherosclerotic calcifications involving the aorta and branch vessels. No mesenteric or retroperitoneal mass or lymphadenopathy. Other: No pelvic mass or adenopathy. No pre pelvic fluid collections. No inguinal mass or adenopathy. Musculoskeletal: No significant bony findings. THORACIC SPINE: Large right apical lung mass invading the chest wall and directly involving the first right and second ribs which are destroyed. There is also tumor destroying the right aspect of T1, T2 and T3. Pathologic compression fracture of T2. Mild canal compromise is also suspected but MR is recommended for further evaluation. A left upper lobe lung lesion is noted along with severe emphysematous changes. Advanced atherosclerotic calcifications involving the aorta but no aneurysm. Lumbar spine: The advanced degenerative lumbar spondylosis with multilevel disc disease and facet disease. T12 compression fracture is noted. No lumbar compression fractures. No obvious destructive bone metastasis. Multilevel disc disease and facet disease. IMPRESSION: 1. Large right apical/paraspinal mass directly invading the T1, T2 and T3 vertebral bodies on  the right side and also destroying the right first and second ribs. There is canal encroachment at T2 but further evaluation with thoracic spine MRI without and with contrast suggested. 2. Bibasilar atelectasis, lung lesions and severe emphysema. 3. Bilateral adrenal gland metastasis. 4. Advanced atherosclerotic calcifications involving the aorta iliac arteries. 5. Remote compression fracture of T12. 6.  No acute abdominal/pelvic findings. Electronically Signed   By: Marijo Sanes M.D.   On: 07/01/2016 16:08   Ct Lumbar Spine Wo Contrast  07/01/2016  CLINICAL DATA:  Lower extremity weakness. Cancer patient receiving chemotherapy and radiation therapy. EXAM: CT ABDOMEN WITHOUT CONTRAST CT PELVIS WITHOUt CONTRAST CT THORACIC SPINE CT LUMBAR SPINE TECHNIQUE: Multidetector CT imaging of the abdomen was performed using the standard protocol. Multidetector CT imaging of the pelvis was performed following the standard protocol without intravenous contrast. Using the CT data thoracic and lumbar spine examinations or degenerated. COMPARISON:  CT SCAN 04/29/2016 FINDINGS: Lower chest: Streaky bibasilar atelectasis, right greater than left. The heart is normal in size for age. No pericardial effusion. Pacer wires are noted. Coronary artery calcifications. Hepatobiliary: No focal hepatic lesions or intrahepatic biliary dilatation. Layering small gallstones nerve gallbladder. No common bile duct dilatation. Pancreas: No mass, inflammation or ductal dilatation. Spleen: Normal size.  No focal lesions. Adrenals/Urinary Tract: Bilateral adrenal gland metastasis are again demonstrated. Renal cysts are noted but no worrisome renal lesions or obstructing ureteral calculi. Chronic right UPJ obstruction. The bladder is decompressed by Foley catheter. Stomach/Bowel: The stomach, duodenum, small bowel and colon are grossly normal. No inflammatory changes, mass lesions or obstructive findings. The terminal ileum is normal. The appendix is  normal. Vascular/Lymphatic: Advanced atherosclerotic calcifications involving the aorta and branch vessels. No mesenteric or retroperitoneal mass or lymphadenopathy. Other: No pelvic mass or adenopathy. No pre pelvic fluid collections. No inguinal mass or adenopathy. Musculoskeletal: No significant bony findings. THORACIC SPINE: Large right apical lung mass invading the chest wall and directly involving the first right and second ribs which are destroyed. There is also tumor destroying the right aspect of T1, T2 and T3. Pathologic compression fracture of T2. Mild canal compromise is also suspected but MR is recommended for further evaluation. A left upper lobe lung lesion is noted along with severe emphysematous changes. Advanced atherosclerotic calcifications involving the aorta but no aneurysm. Lumbar spine: The advanced degenerative lumbar spondylosis with multilevel disc disease and facet disease. T12 compression fracture is noted. No lumbar compression fractures. No obvious destructive bone metastasis. Multilevel disc disease and facet disease. IMPRESSION: 1. Large right apical/paraspinal mass directly invading the T1, T2 and T3 vertebral bodies on the right side and also destroying the right first and second ribs. There is canal encroachment at T2 but further evaluation with thoracic spine MRI without and with contrast suggested. 2. Bibasilar atelectasis, lung lesions and severe emphysema. 3. Bilateral adrenal gland metastasis. 4. Advanced atherosclerotic calcifications involving the aorta iliac arteries. 5. Remote compression fracture of T12. 6. No acute abdominal/pelvic findings. Electronically Signed   By: Marijo Sanes M.D.   On: 07/01/2016 16:08   Dg Abd Acute W/chest  07/01/2016  CLINICAL DATA:  Loose stools. EXAM: DG ABDOMEN ACUTE W/ 1V CHEST COMPARISON:  06/12/2016 FINDINGS: The upright chest x-ray demonstrates cardiac enlargement and stable surgical changes from bypass surgery. There is tortuosity  and calcification of the thoracic aorta. Right-sided PICC line is in good position with its tip in the mid SVC. The pacer wires well position. No infiltrates or effusions. Two views of the abdomen demonstrate scattered air and stool throughout the colon. There are also air-filled loops of small bowel without distention or air-fluid levels. Moderate aortic and iliac artery calcifications. IMPRESSION: No acute cardiopulmonary findings. Moderate stool throughout the. No findings for small bowel obstruction or free air. Electronically Signed   By: Marijo Sanes M.D.   On: 07/01/2016 13:34  I have personally reviewed and evaluated these images and lab results as part of my medical decision-making.   EKG Interpretation   Date/Time:  Monday July 01 2016 12:38:14 EDT Ventricular Rate:  88 PR Interval:    QRS Duration: 172 QT Interval:  446 QTC Calculation: 540 R Axis:   -75 Text Interpretation:  Atrial fibrillation Ventricular premature complex  Nonspecific IVCD with LAD Consider anterior infarct Baseline wander in  lead(s) V4 No significant change was found Confirmed by Wyvonnia Dusky  MD,  Cassadie Pankonin [phone]) on 07/01/2016 12:49:39 PM      MDM   Final diagnoses:  Abdominal pain  Urinary retention  Malignant neoplasm of upper lobe of right lung [facility])  Patient with Pancoast tumor currently receiving chemotherapy and radiation presenting from home with generalized weakness and inability to ambulate over the past 3 days. Sent from oncology clinic today after blood transfusion. Denies any fever. Has had several falls but no head injury. Has been off of Coumadin for 1 week due to elevated levels. States he has been losing control of both his bowels and bladder.  Patient has equal lower extremity strength on exam. He has a palpable distended bladder. There is no saddle anesthesia and there is normal rectal tone. Patient with >1L urine on bladder scan. Foley catheter placed. Concerns for possible cord  compression or cauda equina discussed with Dr. Saintclair Halsted of neurosurgery. Patient unable to get MRI due to AICD. Dr. Saintclair Halsted agrees with CT of T and L-spine.  hemoglobin stable. Creatinine elevated at 1.8. Small fluid bolus given patient's poor ejection fraction.  CT results show large tumor invading thoracic spine. Discussed with Dr. Cyndy Freeze of neurosurgery who reviewed the images. He doubts cord compression but does agree with steroids and transfer to [facility] for myelogram.  D/w Dr. Whitney Muse and Dr. Tammi Klippel of oncology. They agree with transfer to [facility] for myelogram.  BP improving in the ED. Continue gentle hydration.   Admission dw Dr. Maudie Mercury who will arrange for transfer to [facility].   I personally performed the services described in this documentation, which was scribed in my presence. The recorded information has been reviewed and is accurate.   Ezequiel Essex, MD 07/01/16 2312

## 2016-07-01 NOTE — Progress Notes (Signed)
ANTICOAGULATION CONSULT NOTE - Initial Consult  Pharmacy Consult for Warfarin  Indication: atrial fibrillation  No Known Allergies  Patient Measurements: Height: '5\' 10"'$  (177.8 cm) Weight: 205 lb (92.987 kg) IBW/kg (Calculated) : 73  Vital Signs: Temp: 98.4 F (36.9 C) (07/17 1413) Temp Source: Rectal (07/17 1413) BP: 105/68 mmHg (07/17 2120) Pulse Rate: 91 (07/17 2120)  Labs:  Recent Labs  07/01/16 1250  HGB 8.9*  HCT 27.2*  PLT 152  LABPROT 17.6*  INR 1.43  CREATININE 1.81*  TROPONINI <0.03    Estimated Creatinine Clearance: 37.3 mL/min (by C-G formula based on Cr of 1.81).   Medical History: Past Medical History  Diagnosis Date  . CAD (coronary artery disease)     a. CABG 1997. b. NSTEMI (troponin >20) s/p PTCA/DES to LCx 10/21/12, overlapping DES to mid & distal RCA 10/26/12; c. 02/2016 MV: EF 37%, large fixed defect - apex, inf, septal, lat wall, no ischemia.  . Atrial fibrillation ([place])   . Hypertension   . BPH (benign prostatic hyperplasia)   . PVD (peripheral vascular disease) ([place])     a. AAA repair 2001.  Marland Kitchen Systolic CHF ([place])     a. 10/2012: acute on chronic systolic CHF / pulmonary edema secondary to severe mixed cardiomyopathy;  b. 02/2016 Echo: EF 30-35%, sev dil RV, RV dysfxn, massively dil RA, PASP 5mHg.  . Diabetes mellitus ([place]     a. Noted 10/2012 (prior hx of DM resolved with weight loss).  . Ventricular tachycardia ([place]     a. 02/2016 4 ICD shocks for VT-->Amio added.  .Marland KitchenAICD (automatic cardioverter/defibrillator) present   . Shortness of breath dyspnea   . Anxiety   . Lung cancer [place]     pancoast tumor right lung apex invading mediastinum and C7, T1, T2 with extension into the neural foramina and into the spinal canal  . Pancoast tumor of right lung ([place] 05/17/2016  . Cardiomyopathy ([place]   . H/O echocardiogram 02/2016    EF 30-35%  . Shoulder pain    Assessment: Warfarin PTA for afib, here with lower extremity weakness due to  paraspinal mass, warfarin had been on hold due to elevated INR, INR is now low at 1.43  Goal of Therapy:  INR 2-3 Monitor platelets by anticoagulation protocol: Yes   Plan:  -Warfarin 5 mg PO x 1 tonight -Daily PT/INR -Monitor for bleeding  LNarda Bonds7/17/2017,11:03 PM

## 2016-07-01 NOTE — H&P (Addendum)
TRH H&P   Patient Demographics:    George Pollard, is a 82 y.o. male  MRN: [id number]   DOB - [birth year]  Admit Date - 07/01/2016  Outpatient Primary MD for the patient is Monico Blitz, MD  Referring MD/NP/PA: Charolotte Capuchin  Outpatient Specialists: Tammi Klippel (rad onc), [place](onc), Cristopher Peru (cardiology)  Patient coming from: home  Chief Complaint  Patient presents with  . Extremity Weakness      HPI:    George Pollard  is a 82 y.o. male, w hx of CAD s/p CABG, Chronic afib , Lung mass (squamous cell),  C/o weakness in the right arm and also bilateral legs x1 week.  The weakness was worse today so when patient presented for transfusion today and was sent to ED for evaluation.    In ED ,  CT scan showed a paraspinal mass , MRI unable to be done due to pacer/debribrillator.  Neurosurgery consulted and recommended CT myelogram and transfer to [facility].      Review of systems:    In addition to the HPI above,  No Fever-chills, No Headache, No changes with Vision or hearing, No problems swallowing food or Liquids, No Chest pain, Cough or Shortness of Breath, No Abdominal pain, No Nausea or Vommitting, Bowel movements are regular, No Blood in stool or Urine, No dysuria, No new skin rashes or bruises, No new joints pains-aches,  + numbness in bilateral legs.  d3enies incontinence.  No recent weight gain or loss, No polyuria, polydypsia or polyphagia, No significant Mental Stressors.  A full 10 point Review of Systems was done, except as stated above, all other Review of Systems were negative.   With Past History of the following :    Past Medical History  Diagnosis Date  . CAD (coronary artery disease)     a. CABG 1997. b. NSTEMI (troponin >20) s/p PTCA/DES to LCx 10/21/12, overlapping DES to mid & distal RCA 10/26/12; c. 02/2016 MV: EF 37%, large fixed defect -  apex, inf, septal, lat wall, no ischemia.  . Atrial fibrillation ([place])   . Hypertension   . BPH (benign prostatic hyperplasia)   . PVD (peripheral vascular disease) ([place])     a. AAA repair 2001.  Marland Kitchen Systolic CHF ([place])     a. 10/2012: acute on chronic systolic CHF / pulmonary edema secondary to severe mixed cardiomyopathy;  b. 02/2016 Echo: EF 30-35%, sev dil RV, RV dysfxn, massively dil RA, PASP 11mHg.  . Diabetes [place]     a. Noted 10/2012 (prior hx of DM resolved with weight loss).  . Ventricular tachycardia ([place]     a. 02/2016 4 ICD shocks for VT-->Amio added.  .Marland KitchenAICD (automatic cardioverter/defibrillator) present   . Shortness of breath dyspnea   . Anxiety   . Lung cancer ([place]     pancoast tumor right lung apex invading mediastinum and C7, T1,  T2 with extension into the neural foramina and into the spinal canal  . Pancoast tumor of right lung ([place]) 05/17/2016  . Cardiomyopathy ([place])   . H/O echocardiogram 02/2016    EF 30-35%  . Shoulder pain       Past Surgical History  Procedure Laterality Date  . Coronary artery bypass graft      1997  . Abdominal aortic aneurysm repair      08/26/2000  . Cataract extraction    . Tonsillectomy    . Left heart catheterization with coronary/graft angiogram N/A 10/21/2012    Procedure: LEFT HEART CATHETERIZATION WITH Beatrix Fetters;  Surgeon: Burnell Blanks, MD;  Location: [facility] CATH LAB;  Service: Cardiovascular;  Laterality: N/A;  . Percutaneous coronary stent intervention (pci-s) N/A 10/26/2012    Procedure: PERCUTANEOUS CORONARY STENT INTERVENTION (PCI-S);  Surgeon: Sherren Mocha, MD;  Location: [facility] CATH LAB;  Service: Cardiovascular;  Laterality: N/A;  . Cardiac catheterization    . Mediastinoscopy N/A 05/03/2016    Procedure: MEDIASTINOSCOPY;  Surgeon: Ivin Poot, MD;  Location: [facility] OR;  Service: Thoracic;  Laterality: N/A;  . Video bronchoscopy with endobronchial ultrasound N/A 05/03/2016    Procedure: VIDEO  BRONCHOSCOPY WITH ENDOBRONCHIAL ULTRASOUND;  Surgeon: Ivin Poot, MD;  Location: [facility] OR;  Service: Thoracic;  Laterality: N/A;      Social History:     Social History  Substance Use Topics  . Smoking status: Former Smoker -- 2.00 packs/day for 20 years    Types: Cigarettes    Quit date: 12/17/1995  . Smokeless tobacco: Never Used     Comment: Quit 1997  . Alcohol Use: No     Lives - at home.   Mobility -      Family History :     Family History  Problem Relation Age of Onset  . Valvular heart disease Daughter     MVP repair  . Alzheimer's disease Father   . Diabetes Father   . Heart attack Mother 43  . Congestive Heart Failure Mother       Home Medications:   Prior to Admission medications   Medication Sig Start Date End Date Taking? Authorizing Provider  amiodarone (PACERONE) 200 MG tablet Take 1 tablet by mouth 2 (two) times daily. 03/26/16  Yes Historical Provider, MD  aspirin EC 81 MG tablet Take 81 mg by mouth daily.   Yes Historical Provider, MD  atorvastatin (LIPITOR) 80 MG tablet take 1 tablet by mouth once daily 12/18/12  Yes Jolaine Artist, MD  fentaNYL (DURAGESIC - DOSED MCG/HR) 50 MCG/HR Place 1 patch (50 mcg total) onto the skin every 3 (three) days. 06/27/16  Yes Manon Hilding Kefalas, PA-C  furosemide (LASIX) 40 MG tablet Take 1 tablet (40 mg total) by mouth 2 (two) times daily. Patient taking differently: Take 40 mg by mouth 3 (three) times daily.  04/05/16  Yes Evans Lance, MD  gabapentin (NEURONTIN) 300 MG capsule Take 1 capsule (300 mg total) by mouth at bedtime. 05/30/16  Yes Patrici Ranks, MD  Heparin Lock Flush (HEPARIN FLUSH, PORCINE,) 100 UNIT/ML injection Flush PICC line twice a week. First flush with Saline. Then Flush PICC line with 2.60m of Heparin. 05/28/16  Yes SPatrici Ranks MD  HYDROcodone-acetaminophen (NORCO) 10-325 MG tablet Take 1 tablet by mouth every 4 (four) hours as needed. Patient taking differently: Take 1 tablet by  mouth every 4 (four) hours as needed. For pain 06/06/16  Yes TBaird Cancer PA-C  lisinopril (PRINIVIL,ZESTRIL) 2.5 MG tablet take 1 tablet by mouth once daily 12/18/12  Yes Jolaine Artist, MD  metFORMIN (GLUCOPHAGE) 500 MG tablet Take 500 mg by mouth daily.  01/22/16  Yes Historical Provider, MD  Metoprolol Tartrate (LOPRESSOR) 50 MG tablet Take 1.5 tablets (75 mg total) by mouth 2 (two) times daily. 03/01/16  Yes Rogelia Mire, NP  nitroGLYCERIN (NITROSTAT) 0.4 MG SL tablet Place 1 tablet (0.4 mg total) under the tongue every 5 (five) minutes as needed for chest pain (up to 3 doses). 10/29/12  Yes Dayna N Dunn, PA-C  ondansetron (ZOFRAN) 8 MG tablet Take 1 tablet (8 mg total) by mouth every 8 (eight) hours as needed for nausea or vomiting. 06/27/16  Yes Baird Cancer, PA-C  Potassium Bicarb-Citric Acid 20 MEQ TBEF Take 1 tablet (20 mEq total) by mouth daily. 06/13/16  Yes Patrici Ranks, MD  prochlorperazine (COMPAZINE) 10 MG tablet Take 1 tablet (10 mg total) by mouth every 6 (six) hours as needed for nausea or vomiting. 06/27/16  Yes Baird Cancer, PA-C  Sodium Chloride Flush (NORMAL SALINE FLUSH) 0.9 % SOLN Flush PICC line twice a week with 56m prior to flushing with Heparin. 05/20/16  Yes TManon HildingKefalas, PA-C  sucralfate (CARAFATE) 1 g tablet Take 1 tablet (1 g total) by mouth 4 (four) times daily -  with meals and at bedtime. 5 min before meals for radiation induced esophagitis 06/07/16  Yes MTyler Pita MD  warfarin (COUMADIN) 5 MG tablet Take 1 tablet (5 mg total) by mouth daily at 6 PM. 03/01/16  Yes CRogelia Mire NP  CARBOPLATIN IV Inject into the vein. To be given weekly with radiation    Historical Provider, MD  feeding supplement (BOOST HIGH PROTEIN) LIQD Take 1 Container by mouth 2 (two) times daily. Chocolate/Strawberry    Historical Provider, MD  PACLitaxel (TAXOL IV) Inject into the vein. To be given weekly with radiation    Historical Provider, MD      Allergies:    No Known Allergies   Physical Exam:   Vitals  Blood pressure 97/47, pulse 85, temperature 98.4 F (36.9 C), temperature source Rectal, resp. rate 14, height '5\' 10"'$  (1.778 m), weight 92.987 kg (205 lb), SpO2 96 %.   1. General  lying in bed in NAD,    2. Normal affect and insight, Not Suicidal or Homicidal, Awake Alert, Oriented X 3.  3. No F.N deficits, ALL C.Nerves Intact, Strength 5/5 all 4 extremities,except 5-/5 in the right upper ext, and 5-/5 in the bilateral lower ext   and  Sensation intact all 4 extremities, Plantars down going.    4. Ears and Eyes appear Normal, Conjunctivae clear, PERRLA. Moist Oral Mucosa.  5. Supple Neck, No JVD, No cervical lymphadenopathy appriciated, No Carotid Bruits.  6. Symmetrical Chest wall movement, Good air movement bilaterally, CTAB.  7. RRR, No Gallops, Rubs or Murmurs, No Parasternal Heave.  8. Positive Bowel Sounds, Abdomen Soft, No tenderness, No organomegaly appriciated,No rebound -guarding or rigidity.  9.  No Cyanosis, Normal Skin Turgor, No Skin Rash or Bruise. + sacral decub   10. Good muscle tone,  joints appear normal , no effusions, Normal ROM.  11. No Palpable Lymph Nodes in Neck or Axillae  Onychomycosis    Data Review:    CBC  Recent Labs Lab 06/24/16 1920 06/27/16 0915 07/01/16 1250  WBC 3.6* 3.4* 4.5  HGB 8.8* 8.6* 8.9*  HCT 27.0* 26.7* 27.2*  PLT  144* 173 152  MCV 90.0 91.4 90.1  MCH 29.3 29.5 29.5  MCHC 32.6 32.2 32.7  RDW 18.0* 18.2* 18.8*  LYMPHSABS 0.3* 0.4* 0.5*  MONOABS 0.2 0.3 0.6  EOSABS 0.0 0.0 0.0  BASOSABS 0.0 0.0 0.0   ------------------------------------------------------------------------------------------------------------------  Chemistries   Recent Labs Lab 06/24/16 1920 06/27/16 0915 07/01/16 1250  NA 136 137 136  K 3.9 3.4* 3.8  CL 103 103 103  CO2 '28 28 26  '$ GLUCOSE 165* 203* 180*  BUN 16 16 27*  CREATININE 0.80 1.09 1.81*  CALCIUM 8.1* 8.4* 8.4*  AST   --  25 21  ALT  --  35 27  ALKPHOS  --  68 69  BILITOT  --  1.6* 2.4*   ------------------------------------------------------------------------------------------------------------------ estimated creatinine clearance is 37.3 mL/min (by C-G formula based on Cr of 1.81). ------------------------------------------------------------------------------------------------------------------ No results for input(s): TSH, T4TOTAL, T3FREE, THYROIDAB in the last 72 hours.  Invalid input(s): FREET3  Coagulation profile  Recent Labs Lab 06/24/16 1920 06/27/16 0915 07/01/16 1250  INR 3.37* 2.99* 1.43   ------------------------------------------------------------------------------------------------------------------- No results for input(s): DDIMER in the last 72 hours. -------------------------------------------------------------------------------------------------------------------  Cardiac Enzymes  Recent Labs Lab 07/01/16 1250  TROPONINI <0.03   ------------------------------------------------------------------------------------------------------------------    Component Value Date/Time   BNP 348.0* 07/01/2016 1250     ---------------------------------------------------------------------------------------------------------------  Urinalysis    Component Value Date/Time   COLORURINE YELLOW 07/01/2016 1400   APPEARANCEUR CLEAR 07/01/2016 1400   LABSPEC 1.020 07/01/2016 1400   PHURINE 5.5 07/01/2016 1400   GLUCOSEU NEGATIVE 07/01/2016 1400   HGBUR NEGATIVE 07/01/2016 1400   BILIRUBINUR NEGATIVE 07/01/2016 1400   KETONESUR NEGATIVE 07/01/2016 1400   PROTEINUR NEGATIVE 07/01/2016 1400   NITRITE NEGATIVE 07/01/2016 1400   LEUKOCYTESUR NEGATIVE 07/01/2016 1400    ----------------------------------------------------------------------------------------------------------------   Imaging Results:    Ct Abdomen Pelvis Wo Contrast  07/01/2016  CLINICAL DATA:  Lower extremity  weakness. Cancer patient receiving chemotherapy and radiation therapy. EXAM: CT ABDOMEN WITHOUT CONTRAST CT PELVIS WITHOUt CONTRAST CT THORACIC SPINE CT LUMBAR SPINE TECHNIQUE: Multidetector CT imaging of the abdomen was performed using the standard protocol. Multidetector CT imaging of the pelvis was performed following the standard protocol without intravenous contrast. Using the CT data thoracic and lumbar spine examinations or degenerated. COMPARISON:  CT SCAN 04/29/2016 FINDINGS: Lower chest: Streaky bibasilar atelectasis, right greater than left. The heart is normal in size for age. No pericardial effusion. Pacer wires are noted. Coronary artery calcifications. Hepatobiliary: No focal hepatic lesions or intrahepatic biliary dilatation. Layering small gallstones nerve gallbladder. No common bile duct dilatation. Pancreas: No mass, inflammation or ductal dilatation. Spleen: Normal size.  No focal lesions. Adrenals/Urinary Tract: Bilateral adrenal gland metastasis are again demonstrated. Renal cysts are noted but no worrisome renal lesions or obstructing ureteral calculi. Chronic right UPJ obstruction. The bladder is decompressed by Foley catheter. Stomach/Bowel: The stomach, duodenum, small bowel and colon are grossly normal. No inflammatory changes, mass lesions or obstructive findings. The terminal ileum is normal. The appendix is normal. Vascular/Lymphatic: Advanced atherosclerotic calcifications involving the aorta and branch vessels. No mesenteric or retroperitoneal mass or lymphadenopathy. Other: No pelvic mass or adenopathy. No pre pelvic fluid collections. No inguinal mass or adenopathy. Musculoskeletal: No significant bony findings. THORACIC SPINE: Large right apical lung mass invading the chest wall and directly involving the first right and second ribs which are destroyed. There is also tumor destroying the right aspect of T1, T2 and T3. Pathologic compression fracture of T2. Mild canal compromise is  also  suspected but MR is recommended for further evaluation. A left upper lobe lung lesion is noted along with severe emphysematous changes. Advanced atherosclerotic calcifications involving the aorta but no aneurysm. Lumbar spine: The advanced degenerative lumbar spondylosis with multilevel disc disease and facet disease. T12 compression fracture is noted. No lumbar compression fractures. No obvious destructive bone metastasis. Multilevel disc disease and facet disease. IMPRESSION: 1. Large right apical/paraspinal mass directly invading the T1, T2 and T3 vertebral bodies on the right side and also destroying the right first and second ribs. There is canal encroachment at T2 but further evaluation with thoracic spine MRI without and with contrast suggested. 2. Bibasilar atelectasis, lung lesions and severe emphysema. 3. Bilateral adrenal gland metastasis. 4. Advanced atherosclerotic calcifications involving the aorta iliac arteries. 5. Remote compression fracture of T12. 6. No acute abdominal/pelvic findings. Electronically Signed   By: Marijo Sanes M.D.   On: 07/01/2016 16:08   Ct Thoracic Spine Wo Contrast  07/01/2016  CLINICAL DATA:  Lower extremity weakness. Cancer patient receiving chemotherapy and radiation therapy. EXAM: CT ABDOMEN WITHOUT CONTRAST CT PELVIS WITHOUt CONTRAST CT THORACIC SPINE CT LUMBAR SPINE TECHNIQUE: Multidetector CT imaging of the abdomen was performed using the standard protocol. Multidetector CT imaging of the pelvis was performed following the standard protocol without intravenous contrast. Using the CT data thoracic and lumbar spine examinations or degenerated. COMPARISON:  CT SCAN 04/29/2016 FINDINGS: Lower chest: Streaky bibasilar atelectasis, right greater than left. The heart is normal in size for age. No pericardial effusion. Pacer wires are noted. Coronary artery calcifications. Hepatobiliary: No focal hepatic lesions or intrahepatic biliary dilatation. Layering small  gallstones nerve gallbladder. No common bile duct dilatation. Pancreas: No mass, inflammation or ductal dilatation. Spleen: Normal size.  No focal lesions. Adrenals/Urinary Tract: Bilateral adrenal gland metastasis are again demonstrated. Renal cysts are noted but no worrisome renal lesions or obstructing ureteral calculi. Chronic right UPJ obstruction. The bladder is decompressed by Foley catheter. Stomach/Bowel: The stomach, duodenum, small bowel and colon are grossly normal. No inflammatory changes, mass lesions or obstructive findings. The terminal ileum is normal. The appendix is normal. Vascular/Lymphatic: Advanced atherosclerotic calcifications involving the aorta and branch vessels. No mesenteric or retroperitoneal mass or lymphadenopathy. Other: No pelvic mass or adenopathy. No pre pelvic fluid collections. No inguinal mass or adenopathy. Musculoskeletal: No significant bony findings. THORACIC SPINE: Large right apical lung mass invading the chest wall and directly involving the first right and second ribs which are destroyed. There is also tumor destroying the right aspect of T1, T2 and T3. Pathologic compression fracture of T2. Mild canal compromise is also suspected but MR is recommended for further evaluation. A left upper lobe lung lesion is noted along with severe emphysematous changes. Advanced atherosclerotic calcifications involving the aorta but no aneurysm. Lumbar spine: The advanced degenerative lumbar spondylosis with multilevel disc disease and facet disease. T12 compression fracture is noted. No lumbar compression fractures. No obvious destructive bone metastasis. Multilevel disc disease and facet disease. IMPRESSION: 1. Large right apical/paraspinal mass directly invading the T1, T2 and T3 vertebral bodies on the right side and also destroying the right first and second ribs. There is canal encroachment at T2 but further evaluation with thoracic spine MRI without and with contrast suggested.  2. Bibasilar atelectasis, lung lesions and severe emphysema. 3. Bilateral adrenal gland metastasis. 4. Advanced atherosclerotic calcifications involving the aorta iliac arteries. 5. Remote compression fracture of T12. 6. No acute abdominal/pelvic findings. Electronically Signed   By: Ricky Stabs.D.  On: 07/01/2016 16:08   Ct Lumbar Spine Wo Contrast  07/01/2016  CLINICAL DATA:  Lower extremity weakness. Cancer patient receiving chemotherapy and radiation therapy. EXAM: CT ABDOMEN WITHOUT CONTRAST CT PELVIS WITHOUt CONTRAST CT THORACIC SPINE CT LUMBAR SPINE TECHNIQUE: Multidetector CT imaging of the abdomen was performed using the standard protocol. Multidetector CT imaging of the pelvis was performed following the standard protocol without intravenous contrast. Using the CT data thoracic and lumbar spine examinations or degenerated. COMPARISON:  CT SCAN 04/29/2016 FINDINGS: Lower chest: Streaky bibasilar atelectasis, right greater than left. The heart is normal in size for age. No pericardial effusion. Pacer wires are noted. Coronary artery calcifications. Hepatobiliary: No focal hepatic lesions or intrahepatic biliary dilatation. Layering small gallstones nerve gallbladder. No common bile duct dilatation. Pancreas: No mass, inflammation or ductal dilatation. Spleen: Normal size.  No focal lesions. Adrenals/Urinary Tract: Bilateral adrenal gland metastasis are again demonstrated. Renal cysts are noted but no worrisome renal lesions or obstructing ureteral calculi. Chronic right UPJ obstruction. The bladder is decompressed by Foley catheter. Stomach/Bowel: The stomach, duodenum, small bowel and colon are grossly normal. No inflammatory changes, mass lesions or obstructive findings. The terminal ileum is normal. The appendix is normal. Vascular/Lymphatic: Advanced atherosclerotic calcifications involving the aorta and branch vessels. No mesenteric or retroperitoneal mass or lymphadenopathy. Other: No pelvic  mass or adenopathy. No pre pelvic fluid collections. No inguinal mass or adenopathy. Musculoskeletal: No significant bony findings. THORACIC SPINE: Large right apical lung mass invading the chest wall and directly involving the first right and second ribs which are destroyed. There is also tumor destroying the right aspect of T1, T2 and T3. Pathologic compression fracture of T2. Mild canal compromise is also suspected but MR is recommended for further evaluation. A left upper lobe lung lesion is noted along with severe emphysematous changes. Advanced atherosclerotic calcifications involving the aorta but no aneurysm. Lumbar spine: The advanced degenerative lumbar spondylosis with multilevel disc disease and facet disease. T12 compression fracture is noted. No lumbar compression fractures. No obvious destructive bone metastasis. Multilevel disc disease and facet disease. IMPRESSION: 1. Large right apical/paraspinal mass directly invading the T1, T2 and T3 vertebral bodies on the right side and also destroying the right first and second ribs. There is canal encroachment at T2 but further evaluation with thoracic spine MRI without and with contrast suggested. 2. Bibasilar atelectasis, lung lesions and severe emphysema. 3. Bilateral adrenal gland metastasis. 4. Advanced atherosclerotic calcifications involving the aorta iliac arteries. 5. Remote compression fracture of T12. 6. No acute abdominal/pelvic findings. Electronically Signed   By: Marijo Sanes M.D.   On: 07/01/2016 16:08   Dg Abd Acute W/chest  07/01/2016  CLINICAL DATA:  Loose stools. EXAM: DG ABDOMEN ACUTE W/ 1V CHEST COMPARISON:  06/12/2016 FINDINGS: The upright chest x-ray demonstrates cardiac enlargement and stable surgical changes from bypass surgery. There is tortuosity and calcification of the thoracic aorta. Right-sided PICC line is in good position with its tip in the mid SVC. The pacer wires well position. No infiltrates or effusions. Two views  of the abdomen demonstrate scattered air and stool throughout the colon. There are also air-filled loops of small bowel without distention or air-fluid levels. Moderate aortic and iliac artery calcifications. IMPRESSION: No acute cardiopulmonary findings. Moderate stool throughout the. No findings for small bowel obstruction or free air. Electronically Signed   By: Marijo Sanes M.D.   On: 07/01/2016 13:34       Assessment & Plan:    Active Problems:  Weakness   Edema   Paraspinal mass   Renal insufficiency   Urinary retention   Hypotension    1. Weakness secondary to paraspinal mass.  Appreciate neurosurgery input.  CT myelogram ordered,  CT brain ordered.  Decadron '10mg'$  iv q6h  2. ARF  Check urine sodium, urine creatinine, urine eosinophils.  Check renal ultrasound Hydrate with ns iv.  Hold lasix, hydrate gently with ns iv,  Hold lisinopril No carboplatin  3.  Dm2 Stop metformin due to renal insufficiency fsbs ac and qhs, iss  4. Anemia S/p transfusion Check cbc in am  5.  Lung cancer (squamous) Please consult oncology in am and possibly rad onc for paraspinal mass.   6.  Pressure ulcers Wound care consultation  7.  Pafib (chadsvasc2=  4) Coumadin pharmacy to dose  8. Hypotension Tele Monitor bp Gentle hydration Hold lasix D/C lisinopirl  Hold parameters placed on metoprolol Trop i in am.   DVT Prophylaxis coumadin , SCDs   AM Labs Ordered, also please review Full Orders  Family Communication: Admission, patients condition and plan of care including tests being ordered have been discussed with the patient who indicate understanding and agree with the plan and Code Status.  Code Status DNR  Likely DC to  SNF  Condition critical  Consults called: ED called neurosurgery  Admission status: inpatient  Time spent in minutes : 50 minutes critical care   Jani Gravel M.D on 07/01/2016 at 5:57 PM  Between 7am to 7pm - Pager - [phone]. After 7pm  go to [email or web address] - password [facility]  [facility] - Office  [phone]

## 2016-07-01 NOTE — ED Notes (Signed)
>   999Ml of urine noted in bladder per bladder scan. MD made aware. New orders given.

## 2016-07-01 NOTE — ED Notes (Signed)
Pt here from outpatient for evaluation of lower extremity weakness. Pt is a cancer pt receiving chemo and radiation. He was in outpatient receiving a unit of blood and the nurses states his being unable to stand is new

## 2016-07-02 ENCOUNTER — Inpatient Hospital Stay (HOSPITAL_COMMUNITY): Payer: Medicare Other

## 2016-07-02 ENCOUNTER — Other Ambulatory Visit (HOSPITAL_COMMUNITY): Payer: Self-pay | Admitting: Emergency Medicine

## 2016-07-02 ENCOUNTER — Ambulatory Visit
Admit: 2016-07-02 | Discharge: 2016-07-02 | Disposition: A | Discharge status: Home / Self Care | Payer: Medicare Other | Attending: Radiation Oncology | Admitting: Radiation Oncology

## 2016-07-02 ENCOUNTER — Encounter (HOSPITAL_COMMUNITY): Payer: Self-pay | Admitting: Internal Medicine

## 2016-07-02 ENCOUNTER — Ambulatory Visit: Admission: RE | Admit: 2016-07-02 | Payer: Medicare Other | Source: Ambulatory Visit

## 2016-07-02 DIAGNOSIS — N179 Acute kidney failure, unspecified: Secondary | ICD-10-CM | POA: Diagnosis present

## 2016-07-02 DIAGNOSIS — L8990 Pressure ulcer of unspecified site, unspecified stage: Secondary | ICD-10-CM | POA: Diagnosis present

## 2016-07-02 DIAGNOSIS — R609 Edema, unspecified: Secondary | ICD-10-CM

## 2016-07-02 DIAGNOSIS — C3411 Malignant neoplasm of upper lobe, right bronchus or lung: Principal | ICD-10-CM

## 2016-07-02 DIAGNOSIS — D61810 Antineoplastic chemotherapy induced pancytopenia: Secondary | ICD-10-CM

## 2016-07-02 DIAGNOSIS — L8993 Pressure ulcer of unspecified site, stage 3: Secondary | ICD-10-CM

## 2016-07-02 DIAGNOSIS — I25700 Atherosclerosis of coronary artery bypass graft(s), unspecified, with unstable angina pectoris: Secondary | ICD-10-CM

## 2016-07-02 DIAGNOSIS — E1140 Type 2 diabetes mellitus with diabetic neuropathy, unspecified: Secondary | ICD-10-CM | POA: Diagnosis present

## 2016-07-02 DIAGNOSIS — R339 Retention of urine, unspecified: Secondary | ICD-10-CM

## 2016-07-02 DIAGNOSIS — R531 Weakness: Secondary | ICD-10-CM

## 2016-07-02 DIAGNOSIS — T451X5A Adverse effect of antineoplastic and immunosuppressive drugs, initial encounter: Secondary | ICD-10-CM | POA: Diagnosis present

## 2016-07-02 DIAGNOSIS — I482 Chronic atrial fibrillation: Secondary | ICD-10-CM

## 2016-07-02 LAB — CBC WITH DIFFERENTIAL/PLATELET
BASOS ABS: 0 10*3/uL (ref 0.0–0.1)
BASOS PCT: 0 %
Eosinophils Absolute: 0 10*3/uL (ref 0.0–0.7)
Eosinophils Relative: 0 %
HEMATOCRIT: 27.1 % — AB (ref 39.0–52.0)
Hemoglobin: 8.6 g/dL — ABNORMAL LOW (ref 13.0–17.0)
Lymphocytes Relative: 5 %
Lymphs Abs: 0.2 10*3/uL — ABNORMAL LOW (ref 0.7–4.0)
MCH: 28.9 pg (ref 26.0–34.0)
MCHC: 31.7 g/dL (ref 30.0–36.0)
MCV: 90.9 fL (ref 78.0–100.0)
MONO ABS: 0.1 10*3/uL (ref 0.1–1.0)
Monocytes Relative: 3 %
NEUTROS ABS: 3.3 10*3/uL (ref 1.7–7.7)
Neutrophils Relative %: 92 %
PLATELETS: 134 10*3/uL — AB (ref 150–400)
RBC: 2.98 MIL/uL — ABNORMAL LOW (ref 4.22–5.81)
RDW: 19 % — AB (ref 11.5–15.5)
WBC: 3.6 10*3/uL — ABNORMAL LOW (ref 4.0–10.5)

## 2016-07-02 LAB — COMPREHENSIVE METABOLIC PANEL
ALBUMIN: 2.4 g/dL — AB (ref 3.5–5.0)
ALT: 27 U/L (ref 17–63)
AST: 19 U/L (ref 15–41)
Alkaline Phosphatase: 66 U/L (ref 38–126)
Anion gap: 7 (ref 5–15)
BILIRUBIN TOTAL: 2 mg/dL — AB (ref 0.3–1.2)
BUN: 20 mg/dL (ref 6–20)
CHLORIDE: 103 mmol/L (ref 101–111)
CO2: 27 mmol/L (ref 22–32)
Calcium: 8.6 mg/dL — ABNORMAL LOW (ref 8.9–10.3)
Creatinine, Ser: 1.56 mg/dL — ABNORMAL HIGH (ref 0.61–1.24)
GFR calc Af Amer: 47 mL/min — ABNORMAL LOW (ref >60)
GFR calc non Af Amer: 40 mL/min — ABNORMAL LOW (ref >60)
GLUCOSE: 255 mg/dL — AB (ref 65–99)
POTASSIUM: 3.8 mmol/L (ref 3.5–5.1)
Sodium: 137 mmol/L (ref 135–145)
Total Protein: 5.2 g/dL — ABNORMAL LOW (ref 6.5–8.1)

## 2016-07-02 LAB — PROTIME-INR
INR: 1.37 (ref 0.00–1.49)
Prothrombin Time: 17 seconds — ABNORMAL HIGH (ref 11.6–15.2)

## 2016-07-02 LAB — MRSA PCR SCREENING: MRSA BY PCR: NEGATIVE

## 2016-07-02 LAB — TYPE AND SCREEN
ABO/RH(D): O NEG
Antibody Screen: NEGATIVE
Unit division: 0

## 2016-07-02 LAB — SODIUM, URINE, RANDOM: SODIUM UR: 44 mmol/L

## 2016-07-02 LAB — GLUCOSE, CAPILLARY
GLUCOSE-CAPILLARY: 234 mg/dL — AB (ref 65–99)
GLUCOSE-CAPILLARY: 200 mg/dL — AB (ref 65–99)
GLUCOSE-CAPILLARY: 221 mg/dL — AB (ref 65–99)
GLUCOSE-CAPILLARY: 264 mg/dL — AB (ref 65–99)
Glucose-Capillary: 277 mg/dL — ABNORMAL HIGH (ref 65–99)

## 2016-07-02 MED ORDER — INSULIN ASPART 100 UNIT/ML ~~LOC~~ SOLN
0.0000 [IU] | SUBCUTANEOUS | Status: DC
  Administered 2016-07-02 (×2): 5 [IU] via SUBCUTANEOUS
  Administered 2016-07-02: 8 [IU] via SUBCUTANEOUS
  Administered 2016-07-03: 5 [IU] via SUBCUTANEOUS
  Administered 2016-07-03: 2 [IU] via SUBCUTANEOUS
  Administered 2016-07-03: 3 [IU] via SUBCUTANEOUS
  Administered 2016-07-03: 8 [IU] via SUBCUTANEOUS
  Administered 2016-07-03 (×2): 5 [IU] via SUBCUTANEOUS
  Administered 2016-07-03: 3 [IU] via SUBCUTANEOUS
  Administered 2016-07-04: 5 [IU] via SUBCUTANEOUS
  Administered 2016-07-04: 8 [IU] via SUBCUTANEOUS
  Administered 2016-07-04 (×3): 3 [IU] via SUBCUTANEOUS
  Administered 2016-07-05 (×2): 5 [IU] via SUBCUTANEOUS
  Administered 2016-07-05: 8 [IU] via SUBCUTANEOUS
  Administered 2016-07-05: 5 [IU] via SUBCUTANEOUS
  Administered 2016-07-05: 8 [IU] via SUBCUTANEOUS
  Administered 2016-07-05: 5 [IU] via SUBCUTANEOUS
  Administered 2016-07-06 (×2): 3 [IU] via SUBCUTANEOUS
  Administered 2016-07-06: 5 [IU] via SUBCUTANEOUS

## 2016-07-02 MED ORDER — ENSURE ENLIVE PO LIQD
237.0000 mL | Freq: Two times a day (BID) | ORAL | Status: DC
  Administered 2016-07-03 – 2016-07-05 (×3): 237 mL via ORAL

## 2016-07-02 MED ORDER — LIDOCAINE HCL (PF) 1 % IJ SOLN
10.0000 mL | Freq: Once | INTRAMUSCULAR | Status: AC
  Administered 2016-07-02: 3 mL via INTRADERMAL

## 2016-07-02 MED ORDER — LIDOCAINE HCL 1 % IJ SOLN
INTRAMUSCULAR | Status: AC
  Filled 2016-07-02: qty 10

## 2016-07-02 MED ORDER — GERHARDT'S BUTT CREAM
TOPICAL_CREAM | Freq: Three times a day (TID) | CUTANEOUS | Status: DC
  Administered 2016-07-02: 1 via TOPICAL
  Administered 2016-07-03 – 2016-07-05 (×6): via TOPICAL
  Filled 2016-07-02 (×2): qty 1

## 2016-07-02 MED ORDER — SODIUM CHLORIDE 0.9% FLUSH
10.0000 mL | Freq: Two times a day (BID) | INTRAVENOUS | Status: DC
  Administered 2016-07-02 – 2016-07-05 (×4): 10 mL

## 2016-07-02 MED ORDER — IOHEXOL 300 MG/ML  SOLN
10.0000 mL | Freq: Once | INTRAMUSCULAR | Status: AC | PRN
  Administered 2016-07-02: 10 mL via INTRATHECAL

## 2016-07-02 MED ORDER — SODIUM CHLORIDE 0.9% FLUSH
10.0000 mL | INTRAVENOUS | Status: DC | PRN
Start: 2016-07-02 — End: 2016-07-06
  Administered 2016-07-04 – 2016-07-06 (×3): 10 mL
  Filled 2016-07-02 (×3): qty 40

## 2016-07-02 NOTE — Progress Notes (Signed)
Progress Note    George Pollard  RKY:[id number] DOB: 10-26-1933  DOA: 07/01/2016 PCP: Monico Blitz, MD    Brief Narrative:   George Pollard is an 82 y.o. male with a PMH of CAD status post CABG, chronic atrial fibrillation, squamous cell carcinoma of the lung diagnosed 5/17 status post radiation therapy, currently on weekly carboplatin and paclitaxel who was admitted 07/01/16 with a chief complaint of a one-week history of bilateral leg and right arm weakness. A CT scan done on admission showed a paraspinal mass. Patient unable to have MRI secondary to pacer/defibrillator in situ. Transferred to [facility] for CT myelogram per neurosurgery recommendations.  Assessment/Plan:   Principal Problem:   Paraspinal mass with bilateral leg and right arm weakness in the setting of metastatic squamous cell carcinoma of the lung/Pancoast tumor Findings concerning for metastatic cancer. There is also evidence of adrenal gland metastasis on imaging. CT of the head negative for acute intracranial findings including mass. CT myelogram ordered. Continue Decadron. Will notify radiation oncology of the patient's admission. Currently receiving radiation to the chest, but may be a candidate for radiation to the paraspinal mass or may need surgical decompression depending on what the myelogram shows.  Active Problems:   Anti-neoplastic chemotherapy-induced pancytopenia Monitor blood counts closely. No current indication for transfusion.    Type 2 diabetes with diabetic neuropathy Metformin on hold secondary to renal insufficiency. Currently being managed with insulin sensitive SSI. Change to moderate scale as CBGs in the 200s. Monitor glycemic control closely with initiation of steroid therapy. Continue Neurontin for diabetic neuropathy. Follow-up hemoglobin A1c.    Chronic atrial fibrillation Continue amiodarone and Coumadin per pharmacy. INR subtherapeutic at present.    Coronary artery disease with history  of CABG Continue aspirin, metoprolol and statin.    Acute kidney injury/hypotension Baseline creatinine 1.09. Creatinine 1.89 on admission. Improving with gentle hydration. Continue to hold metformin, Lasix and lisinopril. Renal ultrasound negative for hydronephrosis.    Edema Patient has hypoalbuminemia.    Urinary retention Patient bladder scanned and found to have a liter of urine in the bladder. Foley placed. No hydronephrosis on ultrasound.    Pressure ulcer, stage III We'll ask wound care nurse to evaluate.   Family Communication/Anticipated D/C date and plan/Code Status   DVT prophylaxis: Coumadin ordered. Code Status: Full Code.  Family Communication: Wife and daughter at the bedside. Disposition Plan: Undetermined. May need SNF the family can't handle his needs.   Medical Consultants:    Radiation Oncology   Procedures:   CT myelogram 07/02/16: Pending  Anti-Infectives:   None.  Subjective:    George Pollard is sleepy, but when awakens, does report right shoulder pain. He has had weakness for several weeks. No nausea or vomiting.  Objective:    Filed Vitals:   07/01/16 2300 07/02/16 0000 07/02/16 0043 07/02/16 0400  BP: 93/56 112/59  103/60  Pulse: 84 89 91 84  Temp:  97.9 F (36.6 C)  97.6 F (36.4 C)  TempSrc:  Oral  Oral  Resp: '15 18  24  '$ Height:      Weight:      SpO2: 100% 99%  99%    Intake/Output Summary (Last 24 hours) at 07/02/16 0726 Last data filed at 07/02/16 0600  Gross per 24 hour  Intake 244.17 ml  Output   3725 ml  Net -3480.83 ml   Filed Weights   07/01/16 1211  Weight: 92.987 kg (205 lb)    Exam: General  exam: Appears calm and comfortable.  Respiratory system: Clear to auscultation. Respiratory effort normal. Cardiovascular system: HSIR. No JVD,  rubs, gallops or clicks. No murmurs. Gastrointestinal system: Abdomen is nondistended, soft and nontender. No organomegaly or masses felt. Normal bowel sounds  heard. Central nervous system: Sleepy, oriented. Lower extremity weakness not assessed as the patient was being taken down for his procedure shortly after I began to examine him. Extremities: Lower extremity venous stasis with 2+ pitting edema. Skin: Warm and dry. Psychiatry: Judgement and insight appear normal. Mood & affect appropriate.   Data Reviewed:   I have personally reviewed following labs and imaging studies:  Labs: Basic Metabolic Panel:  Recent Labs Lab 06/27/16 0915 07/01/16 1250 07/02/16 0100  NA 137 136 137  K 3.4* 3.8 3.8  CL 103 103 103  CO2 '28 26 27  '$ GLUCOSE 203* 180* 255*  BUN 16 27* 20  CREATININE 1.09 1.81* 1.56*  CALCIUM 8.4* 8.4* 8.6*   GFR Estimated Creatinine Clearance: 43.3 mL/min (by C-G formula based on Cr of 1.56). Liver Function Tests:  Recent Labs Lab 06/27/16 0915 07/01/16 1250 07/02/16 0100  AST '25 21 19  '$ ALT 35 27 27  ALKPHOS 68 69 66  BILITOT 1.6* 2.4* 2.0*  PROT 5.6* 5.4* 5.2*  ALBUMIN 2.7* 2.6* 2.4*   Coagulation profile  Recent Labs Lab 06/27/16 0915 07/01/16 1250 07/02/16 0100  INR 2.99* 1.43 1.37    CBC:  Recent Labs Lab 06/27/16 0915 07/01/16 1250 07/02/16 0100  WBC 3.4* 4.5 3.6*  NEUTROABS 2.7 3.3 3.3  HGB 8.6* 8.9* 8.6*  HCT 26.7* 27.2* 27.1*  MCV 91.4 90.1 90.9  PLT 173 152 134*   Cardiac Enzymes:  Recent Labs Lab 07/01/16 1250  TROPONINI <0.03   CBG:  Recent Labs Lab 07/01/16 2117 07/02/16 0027  GLUCAP 217* 277*   Microbiology Recent Results (from the past 240 hour(s))  MRSA PCR Screening     Status: None   Collection Time: 07/01/16 10:41 PM  Result Value Ref Range Status   MRSA by PCR NEGATIVE NEGATIVE Final    Comment:        The GeneXpert MRSA Assay (FDA approved for NASAL specimens only), is one component of a comprehensive MRSA colonization surveillance program. It is not intended to diagnose MRSA infection nor to guide or monitor treatment for MRSA infections.      Radiology: Ct Abdomen Pelvis Wo Contrast  07/01/2016  CLINICAL DATA:  Lower extremity weakness. Cancer patient receiving chemotherapy and radiation therapy. EXAM: CT ABDOMEN WITHOUT CONTRAST CT PELVIS WITHOUt CONTRAST CT THORACIC SPINE CT LUMBAR SPINE TECHNIQUE: Multidetector CT imaging of the abdomen was performed using the standard protocol. Multidetector CT imaging of the pelvis was performed following the standard protocol without intravenous contrast. Using the CT data thoracic and lumbar spine examinations or degenerated. COMPARISON:  CT SCAN 04/29/2016 FINDINGS: Lower chest: Streaky bibasilar atelectasis, right greater than left. The heart is normal in size for age. No pericardial effusion. Pacer wires are noted. Coronary artery calcifications. Hepatobiliary: No focal hepatic lesions or intrahepatic biliary dilatation. Layering small gallstones nerve gallbladder. No common bile duct dilatation. Pancreas: No mass, inflammation or ductal dilatation. Spleen: Normal size.  No focal lesions. Adrenals/Urinary Tract: Bilateral adrenal gland metastasis are again demonstrated. Renal cysts are noted but no worrisome renal lesions or obstructing ureteral calculi. Chronic right UPJ obstruction. The bladder is decompressed by Foley catheter. Stomach/Bowel: The stomach, duodenum, small bowel and colon are grossly normal. No inflammatory changes, mass lesions or  obstructive findings. The terminal ileum is normal. The appendix is normal. Vascular/Lymphatic: Advanced atherosclerotic calcifications involving the aorta and branch vessels. No mesenteric or retroperitoneal mass or lymphadenopathy. Other: No pelvic mass or adenopathy. No pre pelvic fluid collections. No inguinal mass or adenopathy. Musculoskeletal: No significant bony findings. THORACIC SPINE: Large right apical lung mass invading the chest wall and directly involving the first right and second ribs which are destroyed. There is also tumor destroying the  right aspect of T1, T2 and T3. Pathologic compression fracture of T2. Mild canal compromise is also suspected but MR is recommended for further evaluation. A left upper lobe lung lesion is noted along with severe emphysematous changes. Advanced atherosclerotic calcifications involving the aorta but no aneurysm. Lumbar spine: The advanced degenerative lumbar spondylosis with multilevel disc disease and facet disease. T12 compression fracture is noted. No lumbar compression fractures. No obvious destructive bone metastasis. Multilevel disc disease and facet disease. IMPRESSION: 1. Large right apical/paraspinal mass directly invading the T1, T2 and T3 vertebral bodies on the right side and also destroying the right first and second ribs. There is canal encroachment at T2 but further evaluation with thoracic spine MRI without and with contrast suggested. 2. Bibasilar atelectasis, lung lesions and severe emphysema. 3. Bilateral adrenal gland metastasis. 4. Advanced atherosclerotic calcifications involving the aorta iliac arteries. 5. Remote compression fracture of T12. 6. No acute abdominal/pelvic findings. Electronically Signed   By: Marijo Sanes M.D.   On: 07/01/2016 16:08   Ct Head Wo Contrast  07/01/2016  CLINICAL DATA:  Lower extremity weakness. Cancer patient receiving chemotherapy and radiation therapy. History of lung cancer. EXAM: CT HEAD WITHOUT CONTRAST TECHNIQUE: Contiguous axial images were obtained from the base of the skull through the vertex without intravenous contrast. COMPARISON:  None. FINDINGS: Brain: There is mild generalized age related parenchymal atrophy with commensurate dilatation of the ventricles and sulci. Mild chronic small vessel ischemic changes noted in the bilateral periventricular white matter regions. There is no mass, hemorrhage, edema or other evidence of acute parenchymal abnormality. No extra-axial hemorrhage. Vascular: No hyperdense vessel or unexpected calcification. There  are chronic calcified atherosclerotic changes of the large vessels at the skull base. Skull: Negative for fracture or focal lesion. Sinuses/Orbits: No acute findings. Other: None. IMPRESSION: 1. No acute intracranial findings. No intracranial mass, hemorrhage or edema. 2. Chronic ischemic changes in the white matter. Electronically Signed   By: Franki Cabot M.D.   On: 07/01/2016 19:21   Ct Thoracic Spine Wo Contrast  07/01/2016  CLINICAL DATA:  Lower extremity weakness. Cancer patient receiving chemotherapy and radiation therapy. EXAM: CT ABDOMEN WITHOUT CONTRAST CT PELVIS WITHOUt CONTRAST CT THORACIC SPINE CT LUMBAR SPINE TECHNIQUE: Multidetector CT imaging of the abdomen was performed using the standard protocol. Multidetector CT imaging of the pelvis was performed following the standard protocol without intravenous contrast. Using the CT data thoracic and lumbar spine examinations or degenerated. COMPARISON:  CT SCAN 04/29/2016 FINDINGS: Lower chest: Streaky bibasilar atelectasis, right greater than left. The heart is normal in size for age. No pericardial effusion. Pacer wires are noted. Coronary artery calcifications. Hepatobiliary: No focal hepatic lesions or intrahepatic biliary dilatation. Layering small gallstones nerve gallbladder. No common bile duct dilatation. Pancreas: No mass, inflammation or ductal dilatation. Spleen: Normal size.  No focal lesions. Adrenals/Urinary Tract: Bilateral adrenal gland metastasis are again demonstrated. Renal cysts are noted but no worrisome renal lesions or obstructing ureteral calculi. Chronic right UPJ obstruction. The bladder is decompressed by Foley catheter. Stomach/Bowel: The  stomach, duodenum, small bowel and colon are grossly normal. No inflammatory changes, mass lesions or obstructive findings. The terminal ileum is normal. The appendix is normal. Vascular/Lymphatic: Advanced atherosclerotic calcifications involving the aorta and branch vessels. No mesenteric  or retroperitoneal mass or lymphadenopathy. Other: No pelvic mass or adenopathy. No pre pelvic fluid collections. No inguinal mass or adenopathy. Musculoskeletal: No significant bony findings. THORACIC SPINE: Large right apical lung mass invading the chest wall and directly involving the first right and second ribs which are destroyed. There is also tumor destroying the right aspect of T1, T2 and T3. Pathologic compression fracture of T2. Mild canal compromise is also suspected but MR is recommended for further evaluation. A left upper lobe lung lesion is noted along with severe emphysematous changes. Advanced atherosclerotic calcifications involving the aorta but no aneurysm. Lumbar spine: The advanced degenerative lumbar spondylosis with multilevel disc disease and facet disease. T12 compression fracture is noted. No lumbar compression fractures. No obvious destructive bone metastasis. Multilevel disc disease and facet disease. IMPRESSION: 1. Large right apical/paraspinal mass directly invading the T1, T2 and T3 vertebral bodies on the right side and also destroying the right first and second ribs. There is canal encroachment at T2 but further evaluation with thoracic spine MRI without and with contrast suggested. 2. Bibasilar atelectasis, lung lesions and severe emphysema. 3. Bilateral adrenal gland metastasis. 4. Advanced atherosclerotic calcifications involving the aorta iliac arteries. 5. Remote compression fracture of T12. 6. No acute abdominal/pelvic findings. Electronically Signed   By: Marijo Sanes M.D.   On: 07/01/2016 16:08   Ct Lumbar Spine Wo Contrast  07/01/2016  CLINICAL DATA:  Lower extremity weakness. Cancer patient receiving chemotherapy and radiation therapy. EXAM: CT ABDOMEN WITHOUT CONTRAST CT PELVIS WITHOUt CONTRAST CT THORACIC SPINE CT LUMBAR SPINE TECHNIQUE: Multidetector CT imaging of the abdomen was performed using the standard protocol. Multidetector CT imaging of the pelvis was  performed following the standard protocol without intravenous contrast. Using the CT data thoracic and lumbar spine examinations or degenerated. COMPARISON:  CT SCAN 04/29/2016 FINDINGS: Lower chest: Streaky bibasilar atelectasis, right greater than left. The heart is normal in size for age. No pericardial effusion. Pacer wires are noted. Coronary artery calcifications. Hepatobiliary: No focal hepatic lesions or intrahepatic biliary dilatation. Layering small gallstones nerve gallbladder. No common bile duct dilatation. Pancreas: No mass, inflammation or ductal dilatation. Spleen: Normal size.  No focal lesions. Adrenals/Urinary Tract: Bilateral adrenal gland metastasis are again demonstrated. Renal cysts are noted but no worrisome renal lesions or obstructing ureteral calculi. Chronic right UPJ obstruction. The bladder is decompressed by Foley catheter. Stomach/Bowel: The stomach, duodenum, small bowel and colon are grossly normal. No inflammatory changes, mass lesions or obstructive findings. The terminal ileum is normal. The appendix is normal. Vascular/Lymphatic: Advanced atherosclerotic calcifications involving the aorta and branch vessels. No mesenteric or retroperitoneal mass or lymphadenopathy. Other: No pelvic mass or adenopathy. No pre pelvic fluid collections. No inguinal mass or adenopathy. Musculoskeletal: No significant bony findings. THORACIC SPINE: Large right apical lung mass invading the chest wall and directly involving the first right and second ribs which are destroyed. There is also tumor destroying the right aspect of T1, T2 and T3. Pathologic compression fracture of T2. Mild canal compromise is also suspected but MR is recommended for further evaluation. A left upper lobe lung lesion is noted along with severe emphysematous changes. Advanced atherosclerotic calcifications involving the aorta but no aneurysm. Lumbar spine: The advanced degenerative lumbar spondylosis with multilevel disc  disease and  facet disease. T12 compression fracture is noted. No lumbar compression fractures. No obvious destructive bone metastasis. Multilevel disc disease and facet disease. IMPRESSION: 1. Large right apical/paraspinal mass directly invading the T1, T2 and T3 vertebral bodies on the right side and also destroying the right first and second ribs. There is canal encroachment at T2 but further evaluation with thoracic spine MRI without and with contrast suggested. 2. Bibasilar atelectasis, lung lesions and severe emphysema. 3. Bilateral adrenal gland metastasis. 4. Advanced atherosclerotic calcifications involving the aorta iliac arteries. 5. Remote compression fracture of T12. 6. No acute abdominal/pelvic findings. Electronically Signed   By: Marijo Sanes M.D.   On: 07/01/2016 16:08   US Renal  07/01/2016  CLINICAL DATA:  Acute renal failure EXAM: RENAL / URINARY TRACT ULTRASOUND COMPLETE COMPARISON:  CT scan July 01, 2016 FINDINGS: Right Kidney: Length: 13.5 cm. There is a large cyst measuring greater than 6 cm in the right kidney, also seen on the recent CT scan. No hydronephrosis. Left Kidney: Length: 12.3 cm. Echogenicity within normal limits. No mass or hydronephrosis visualized. Bladder: The bladder is decompressed with a Foley catheter. IMPRESSION: 1. No hydronephrosis. Mild increased echogenicity associated with both renal cortices, likely medical renal disease. Electronically Signed   By: Dorise Bullion III M.D   On: 07/01/2016 23:16   Dg Abd Acute W/chest  07/01/2016  CLINICAL DATA:  Loose stools. EXAM: DG ABDOMEN ACUTE W/ 1V CHEST COMPARISON:  06/12/2016 FINDINGS: The upright chest x-ray demonstrates cardiac enlargement and stable surgical changes from bypass surgery. There is tortuosity and calcification of the thoracic aorta. Right-sided PICC line is in good position with its tip in the mid SVC. The pacer wires well position. No infiltrates or effusions. Two views of the abdomen demonstrate  scattered air and stool throughout the colon. There are also air-filled loops of small bowel without distention or air-fluid levels. Moderate aortic and iliac artery calcifications. IMPRESSION: No acute cardiopulmonary findings. Moderate stool throughout the. No findings for small bowel obstruction or free air. Electronically Signed   By: Marijo Sanes M.D.   On: 07/01/2016 13:34    Medications:   . amiodarone  200 mg Oral BID  . aspirin EC  81 mg Oral Daily  . atorvastatin  80 mg Oral Daily  . dexamethasone  10 mg Intravenous Q6H  . gabapentin  300 mg Oral QHS  . insulin aspart  0-5 Units Subcutaneous QHS  . insulin aspart  0-9 Units Subcutaneous TID WC  . lactose free nutrition  1 Container Oral BID  . metoprolol tartrate  75 mg Oral BID  . potassium chloride  20 mEq Oral Daily  . sodium chloride flush  3 mL Intravenous Q12H  . sucralfate  1 g Oral TID WC & HS  . warfarin  5 mg Oral Once  . Warfarin - Pharmacist Dosing Inpatient   Does not apply q1800   Continuous Infusions:   Time spent: 35 minutes with > 50% of time discussing current diagnostic test results, clinical impression and plan of care.   LOS: 1 day   Velma Hanna  [facility] Pager [phone]. If unable to reach me by pager, please call my cell phone at [phone].  *Please refer to [email or web address], password [email or web address] to get updated schedule on who will round on this patient, as hospitalists switch teams weekly. If 7PM-7AM, please contact night-coverage at [email or web address], password [email or web address] for any overnight needs.  07/02/2016, 7:26 AM

## 2016-07-02 NOTE — Progress Notes (Signed)
Spoke with San Jetty, RN at [facility] caring for patient while on 2 heart. Informed her that per Dr. Johny Shears order xrt will be held today. We discussed that treatment tomorrow hinges on myelogram results. Informed L4 of this finding. Will check patient's status tomorrow.

## 2016-07-02 NOTE — Care Management Important Message (Signed)
Important Message  Patient Details  Name: George Pollard MRN: [id number] Date of Birth: 03-04-1933   Medicare Important Message Given:  Yes    Loann Quill 07/02/2016, 7:57 AM

## 2016-07-02 NOTE — Progress Notes (Signed)
Initial Nutrition Assessment  INTERVENTION:   Recommend swallow evaluation if issues arise.  Ensure Enlive po BID, each supplement provides 350 kcal and 20 grams of protein Magic cup TID with meals, each supplement provides 290 kcal and 9 grams of protein   NUTRITION DIAGNOSIS:   Inadequate oral intake related to cancer and cancer related treatments as evidenced by per patient/family report.  GOAL:   Patient will meet greater than or equal to 90% of their needs  MONITOR:   PO intake, Supplement acceptance, I & O's, Weight trends  REASON FOR ASSESSMENT:   Malnutrition Screening Tool    ASSESSMENT:    Pt with hx of CAD s/p CABG, chronic afib, lung mass (squamous cell) dx 5/17 receiving XRT, c/o weakness in right arm and bilateral legs x 1 week. CT showed paraspinal mass, neurosurgery and oncology consulted.   Per wife and daughter pt with poor appetite, taste changes, trouble swallowing thin liquids, thicker are better. Does not want to eat meat. Pt has been on chemo since June 2017.  Pt confirmed information after discussion with family.  Pt has a low threshold for developing malnutrition.   Medications reviewed and include: decacron, KCl, carafate Labs reviewed: CBG's: 234-277 Nutrition-Focused physical exam completed. Findings are no fat depletion, no muscle depletion, and moderate edema.   Usual weight 220 lb 4/17 Pt has lost 7% of his weight in 2 months.   Diet Order:  Diet NPO time specified  Skin:  Wound (see comment) (stage II buttocks x 3)  Last BM:  7/17  Height:   Ht Readings from Last 1 Encounters:  07/01/16 '5\' 10"'$  (1.778 m)    Weight:   Wt Readings from Last 1 Encounters:  07/01/16 205 lb (92.987 kg)    Ideal Body Weight:  75.4 kg  BMI:  Body mass index is 29.41 kg/(m^2).  Estimated Nutritional Needs:   Kcal:  2000-2200  Protein:  105-115 grams  Fluid:  >2 L/day  EDUCATION NEEDS:   Education needs addressed  Maylon Peppers RD, [place],  [place] Pager [phone] After Hours Pager

## 2016-07-02 NOTE — Progress Notes (Signed)
Called pts wife to follow up to see how George Pollard was doing.  They are at [facility] waiting on the myelogram right now.  She will keep [place] updated throughout the day.  Direct line given for her to call me.

## 2016-07-02 NOTE — Progress Notes (Signed)
Inpatient Diabetes Program Recommendations  AACE/ADA: New Consensus Statement on Inpatient Glycemic Control (2015)  Target Ranges:  Prepandial:   less than 140 mg/dL      Peak postprandial:   less than 180 mg/dL (1-2 hours)      Critically ill patients:  140 - 180 mg/dL   Results for RICKI, CLACK (MRN [id number]) as of 07/02/2016 10:02  Ref. Range 07/01/2016 21:17 07/02/2016 00:27 07/02/2016 07:33  Glucose-Capillary Latest Ref Range: 65-99 mg/dL 217 (H) 277 (H) 234 (H)   Review of Glycemic Control  Diabetes history: DM2 Outpatient Diabetes medications: Metformin 500 mg daily Current orders for Inpatient glycemic control: Novolog 0-15 units Q4H  Inpatient Diabetes Program Recommendations: Insulin - Basal: If steroids are continued, please consider ordering low dose basal insulin. Recommend starting with Lantus 10 units Q24H starting now (based on 92.9 kg x 0.1 units). Correction (SSI): Please consider changing to ICU Glycemic Control order set to improve inpatient glycemic control. HgbA1C: A1C in process.  Thanks, Barnie Alderman, RN, MSN, CDE Diabetes Coordinator Inpatient Diabetes Program [phone] (Team Pager from [place] to [place]) [phone] ([facility] office) [phone] [facility] [place] office) [phone] [facility] office)

## 2016-07-02 NOTE — Procedures (Signed)
LP left L4/5 using a 22 gauge needle.  10 cc Isovue 300 instilled.  Contrast directed into upper thoracic spine.  Limited filming, as CT needed to show upper t spine well.

## 2016-07-02 NOTE — Consult Note (Signed)
[place] Nurse wound consult note Reason for Consult: sacral pressure injury Patient started chemo and radiation for lung CA Has 3 partial thickness ulcers, one over the left ischium; 2 in the gluteal fold just proximal to the anus Wound type: Unclear etiology for partial thickness ulcers, discussed with wife. Started at the same time that patient started chemo, has had loose stools as well. He has become less mobile in last few days.  He is sleeping in the recliner. Has HHRN that has been treating for about 2 week.  Due to location of the ulcers just proximal to the anus and the appearance of these ulcers as well as the ulcer near the left ischium I feel these may be systemic related to chemotherapy, however he has been sleeping in a recliner and his wife details how hard it has been for her to move him, change him, clean him up. She has requested hospital bed at home.   Measurement: Left ischium: 3cm x 3cm x 0.2cm  Right upper gluteal fold: 3.5cm x 4cm x 0.2cm  Left upper gluteal fold: 1.5cm x 1.0cm x 0.2cm  Wound bed: all the wounds partial thickness with epithelial buds and yellow fibrin Drainage (amount, consistency, odor) unable to assess, when I arrived some stool present and hydrocolloids in place with do not absorb drainage but actually promote autolysis which can cause some excess drainage.  Periwound: intact  Dressing procedure/placement/frequency: Switch to foam dressings, can be changed easily and more often, and they will absorb drainage if present.  Unfortunately, due to the location the dressings are going to be challenging.  So I have ordered barrier cream (Gerhardts) as well. Will need to use one or the other not the same.  Will add LALM as well for moisture management and pressure redistribution.   Discussed POC with patient and bedside nurse.  Re consult if needed, will not follow at this time. Thanks  Man Effertz [employer], RN,CNS, [place] [phone])

## 2016-07-03 ENCOUNTER — Ambulatory Visit
Admission: RE | Admit: 2016-07-03 | Discharge: 2016-07-03 | Disposition: A | Discharge status: Home / Self Care | Payer: Medicare Other | Source: Ambulatory Visit | Attending: Radiation Oncology | Admitting: Radiation Oncology

## 2016-07-03 DIAGNOSIS — N179 Acute kidney failure, unspecified: Secondary | ICD-10-CM

## 2016-07-03 LAB — HEMOGLOBIN A1C
Hgb A1c MFr Bld: 7.8 % — ABNORMAL HIGH (ref 4.8–5.6)
Mean Plasma Glucose: 177 mg/dL

## 2016-07-03 LAB — GLUCOSE, CAPILLARY
GLUCOSE-CAPILLARY: 186 mg/dL — AB (ref 65–99)
GLUCOSE-CAPILLARY: 213 mg/dL — AB (ref 65–99)
GLUCOSE-CAPILLARY: 233 mg/dL — AB (ref 65–99)
GLUCOSE-CAPILLARY: 261 mg/dL — AB (ref 65–99)
Glucose-Capillary: 155 mg/dL — ABNORMAL HIGH (ref 65–99)
Glucose-Capillary: 186 mg/dL — ABNORMAL HIGH (ref 65–99)
Glucose-Capillary: 256 mg/dL — ABNORMAL HIGH (ref 65–99)

## 2016-07-03 LAB — CALCIUM / CREATININE RATIO, URINE: CREATININE, UR: 68.9 mg/dL

## 2016-07-03 LAB — PROTIME-INR
INR: 1.35 (ref 0.00–1.49)
Prothrombin Time: 16.8 seconds — ABNORMAL HIGH (ref 11.6–15.2)

## 2016-07-03 MED ORDER — TAMSULOSIN HCL 0.4 MG PO CAPS
0.4000 mg | ORAL_CAPSULE | Freq: Every day | ORAL | Status: DC
  Administered 2016-07-03 – 2016-07-05 (×3): 0.4 mg via ORAL
  Filled 2016-07-03 (×3): qty 1

## 2016-07-03 MED ORDER — WARFARIN SODIUM 5 MG PO TABS: 5.0000 mg | ORAL_TABLET | Freq: Once | ORAL | Status: DC

## 2016-07-03 MED ORDER — HEPARIN SODIUM (PORCINE) 5000 UNIT/ML IJ SOLN
5000.0000 [IU] | Freq: Three times a day (TID) | INTRAMUSCULAR | Status: DC
  Administered 2016-07-03 – 2016-07-06 (×9): 5000 [IU] via SUBCUTANEOUS
  Filled 2016-07-03 (×9): qty 1

## 2016-07-03 MED ORDER — DEXAMETHASONE SODIUM PHOSPHATE 10 MG/ML IJ SOLN
4.0000 mg | Freq: Three times a day (TID) | INTRAMUSCULAR | Status: DC
  Administered 2016-07-03 – 2016-07-04 (×2): 4 mg via INTRAVENOUS
  Filled 2016-07-03 (×3): qty 0.4

## 2016-07-03 MED ORDER — DEXAMETHASONE SODIUM PHOSPHATE 10 MG/ML IJ SOLN
4.0000 mg | Freq: Three times a day (TID) | INTRAMUSCULAR | Status: DC
  Filled 2016-07-03 (×3): qty 0.4

## 2016-07-03 MED ORDER — PANTOPRAZOLE SODIUM 40 MG PO TBEC
40.0000 mg | DELAYED_RELEASE_TABLET | Freq: Every day | ORAL | Status: DC
  Administered 2016-07-03 – 2016-07-06 (×4): 40 mg via ORAL
  Filled 2016-07-03 (×4): qty 1

## 2016-07-03 MED ORDER — ONDANSETRON HCL 4 MG/2ML IJ SOLN: 4.0000 mg | Freq: Four times a day (QID) | INTRAMUSCULAR | Status: DC | PRN

## 2016-07-03 NOTE — Progress Notes (Signed)
Received patient on L4. Informed by [facility] patient was in bigeminy and v tach on the ride over. Applied cardiac leads and monitored patient during treatment. Patient paced at a rate of 72-83. Remained at bedside and continued cardiac monitoring while patient was transferred to room 1414. Provided Anderson Malta, RN with report. Patient alert and oriented x 3 and in no distress when I exited room.

## 2016-07-03 NOTE — Progress Notes (Addendum)
ANTICOAGULATION CONSULT NOTE - Initial Consult  Addendum: F/u with Dr. Waldron Labs and will now hold warfarin for next 48 hours because patient had nose bleed yesterday. Will ensure all orders are canceled.   Pharmacy Consult for Warfarin  Indication: atrial fibrillation  No Known Allergies  Patient Measurements: Height: '5\' 10"'$  (177.8 cm) Weight: 202 lb 8 oz (91.853 kg) IBW/kg (Calculated) : 73  Vital Signs: Temp: 97.7 F (36.5 C) (07/19 1118) Temp Source: Oral (07/19 1118) BP: 112/67 mmHg (07/19 1118) Pulse Rate: 72 (07/19 1118)  Labs:  Recent Labs  07/01/16 1250 07/02/16 0100 07/03/16 0430  HGB 8.9* 8.6*  --   HCT 27.2* 27.1*  --   PLT 152 134*  --   LABPROT 17.6* 17.0* 16.8*  INR 1.43 1.37 1.35  CREATININE 1.81* 1.56*  --   TROPONINI <0.03  --   --     Estimated Creatinine Clearance: 43.1 mL/min (by C-G formula based on Cr of 1.56).  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 INR was sub-therapeutic on admission but it was on hold due to elevated INR. Warfarin dose was scheduled yesterday after the patients myelogram but patient/family refused. Spoke to Dr. Waldron Labs who would like to continue warfarin therapy until oncology says otherwise. Will go ahead and schedule dose for today pending decision by family and oncologist. Hgb low 8.6, PLT low 134.  Goal of Therapy:  INR 2-3 Monitor platelets by anticoagulation protocol: Yes   Plan:  -Warfarin 5 mg PO x 1 tonight -Daily PT/INR -Monitor for bleeding  Melburn Popper, PharmD Clinical Pharmacy Resident Pager: [phone] 07/03/2016 11:33 AM

## 2016-07-03 NOTE — Progress Notes (Signed)
[facility] Therapy Treatment Record Phone [phone]   Radiation Therapy was administered to George Pollard on: 07/03/2016  3:16 PM and was treatment # 25 out of a planned course of 33 treatments.  Radiation Treatment  1). Beam photons with 6-10 energy  2). Brachytherapy None  3). Stereotactic Radiosurgery None  4). Other Radiation None     George Pollard, Rad Therap

## 2016-07-03 NOTE — Progress Notes (Signed)
Results for MASON, DIBIASIO (MRN [id number]) as of 07/03/2016 14:52  Ref. Range 07/02/2016 20:12 07/02/2016 23:52 07/03/2016 03:14 07/03/2016 08:07 07/03/2016 11:17  Glucose-Capillary Latest Ref Range: 65-99 mg/dL 264 (H) 213 (H) 186 (H) 186 (H) 261 (H)  Noted that blood sugar elevated at 11:17. Patient is eating and having supplements.  Recommend changing Novolog MODERATE correction scale to TID & HS if patient eating. Will continue to monitor blood sugars while in the hospital. Harvel Ricks RN BSN CDE

## 2016-07-03 NOTE — Progress Notes (Signed)
Report given to 4 [place] receiving nurse. Pt transferred out via [facility] to [facility] in stable condition. Family present at bedside at the time of transfer.

## 2016-07-03 NOTE — Progress Notes (Addendum)
Progress Note    George Pollard  YOV:[id number] DOB: February 24, 1933  DOA: 07/01/2016 PCP: Monico Blitz, MD    Brief Narrative:   George Pollard is an 82 y.o. male with a PMH of CAD status post CABG, chronic atrial fibrillation, squamous cell carcinoma of the lung diagnosed 5/17 status post radiation therapy, currently on weekly carboplatin and paclitaxel who was admitted 07/01/16 with a chief complaint of a one-week history of bilateral leg and right arm weakness. A CT scan done on admission showed a paraspinal mass. Patient unable to have MRI secondary to pacer/defibrillator in situ. Transferred from [facility] to [facility] for CT myelogram per neurosurgery recommendations, Which was significant for Direct tumor invasion of the right side of the T1, T2 and T3 vertebral bodies. But no block or cord compression.   Assessment/Plan:   Principal Problem:   Paraspinal mass with bilateral leg and right arm weakness in the setting of metastatic squamous cell carcinoma of the lung/Pancoast tumor Findings concerning for metastatic cancer. There is also evidence of adrenal gland metastasis on imaging. CT of the head negative for acute intracranial findings including mass. CT myelogram significant for Direct tumor invasion of the right side of the T1, T2 and T3 vertebral bodies. But no block or cord compression, will continue with radiation therapy, discussed with Dr. Tammi Klippel, will be started on radiation therapy today, so he'll be transferred to [facility] long hospital after which he will continue daily to his more stable, will continue with Decadron taper, continue with 6 mg 3 times a day..  Anti-neoplastic chemotherapy-induced pancytopenia Monitor blood counts closely. No current indication for transfusion.    Type 2 diabetes with diabetic neuropathy Metformin on hold secondary to renal insufficiency. Currently being managed with insulin moderate scale .Monitor glycemic control closely with initiation of steroid  therapy. Continue Neurontin for diabetic neuropathy. Follow-up hemoglobin A1c is 7.8.    Chronic atrial fibrillation Continue amiodarone and Coumadin per pharmacy. INR subtherapeutic at present.D/W with oncology Dr. Whitney Muse there is no contraindication for anticoagulation. Warfarin has been stopped as an outpatient for nasal bleed, and reports an episode of nasal bleed yesterday, will continue to hold warfarin for next 48 hours, then reassess.    Coronary artery disease with history of CABG Continue aspirin, metoprolol and statin.    Acute kidney injury/hypotension Baseline creatinine 1.09. Creatinine 1.89 on admission. Improving with gentle hydration. Continue to hold metformin, Lasix and lisinopril. Renal ultrasound negative for hydronephrosis.    Edema Patient has hypoalbuminemia.    Urinary retention Patient bladder scanned and found to have a liter of urine in the bladder. Foley placed. No hydronephrosis on ultrasound, will start on Flomax, will attempt voiding trial in 24 hours.    Pressure ulcer, stage III  wound care nurse consulted.   Family Communication/Anticipated D/C date and plan/Code Status   DVT prophylaxis: Coumadin Is currently on hold, will start on subcutaneous heparin. Code Status: DNR, confirmed by wife Family Communication: Wife and daughters at the bedside. Disposition Plan: Undetermined. Will transfer [facility], for continuous need of radiation therapy, PT consult pending, but it likely will need SNF.   Discussed with radiation oncology Dr. Tammi Klippel via phone, discussed with patient primary oncologist Dr. Whitney Muse via phone Procedures:   CT myelogram 07/02/16   Anti-Infectives:   None.  Subjective:    George Pollard is sleepy, but when awakens, does report right shoulder pain. He has had weakness for several weeks. No nausea or vomiting.  Objective:  Filed Vitals:   07/03/16 0808 07/03/16 0900 07/03/16 0942 07/03/16 1118  BP: 105/53 116/69 109/67  112/67  Pulse: 69 73 86 72  Temp: 97.8 F (36.6 C)   97.7 F (36.5 C)  TempSrc: Oral   Oral  Resp: '18 18  14  '$ Height:      Weight:      SpO2: 100% 96%  100%    Intake/Output Summary (Last 24 hours) at 07/03/16 1132 Last data filed at 07/03/16 1000  Gross per 24 hour  Intake    957 ml  Output    900 ml  Net     57 ml   Filed Weights   07/01/16 1211 07/03/16 0400  Weight: 92.987 kg (205 lb) 91.853 kg (202 lb 8 oz)    Exam: General exam: Appears calm and comfortable.  Respiratory system: Clear to auscultation. Respiratory effort normal. Cardiovascular system: HSIR. No JVD,  rubs, gallops or clicks. No murmurs. Gastrointestinal system: Abdomen is nondistended, soft and nontender. No organomegaly or masses felt. Normal bowel sounds heard. Central nervous system: Sleepy, oriented. Lower extremity weakness not assessed as the patient was being taken down for his procedure shortly after I began to examine him. Extremities: Lower extremity venous stasis with 2+ pitting edema. Skin: Warm and dry. Psychiatry: Judgement and insight appear normal. Mood & affect appropriate.   Data Reviewed:   I have personally reviewed following labs and imaging studies:  Labs: Basic Metabolic Panel:  Recent Labs Lab 06/27/16 0915 07/01/16 1250 07/02/16 0100  NA 137 136 137  K 3.4* 3.8 3.8  CL 103 103 103  CO2 '28 26 27  '$ GLUCOSE 203* 180* 255*  BUN 16 27* 20  CREATININE 1.09 1.81* 1.56*  CALCIUM 8.4* 8.4* 8.6*   GFR Estimated Creatinine Clearance: 43.1 mL/min (by C-G formula based on Cr of 1.56). Liver Function Tests:  Recent Labs Lab 06/27/16 0915 07/01/16 1250 07/02/16 0100  AST '25 21 19  '$ ALT 35 27 27  ALKPHOS 68 69 66  BILITOT 1.6* 2.4* 2.0*  PROT 5.6* 5.4* 5.2*  ALBUMIN 2.7* 2.6* 2.4*   Coagulation profile  Recent Labs Lab 06/27/16 0915 07/01/16 1250 07/02/16 0100 07/03/16 0430  INR 2.99* 1.43 1.37 1.35    CBC:  Recent Labs Lab 06/27/16 0915 07/01/16 1250  07/02/16 0100  WBC 3.4* 4.5 3.6*  NEUTROABS 2.7 3.3 3.3  HGB 8.6* 8.9* 8.6*  HCT 26.7* 27.2* 27.1*  MCV 91.4 90.1 90.9  PLT 173 152 134*   Cardiac Enzymes:  Recent Labs Lab 07/01/16 1250  TROPONINI <0.03   CBG:  Recent Labs Lab 07/02/16 1738 07/02/16 2012 07/02/16 2352 07/03/16 0314 07/03/16 0807  GLUCAP 221* 264* 213* 186* 186*   Microbiology Recent Results (from the past 240 hour(s))  MRSA PCR Screening     Status: None   Collection Time: 07/01/16 10:41 PM  Result Value Ref Range Status   MRSA by PCR NEGATIVE NEGATIVE Final    Comment:        The GeneXpert MRSA Assay (FDA approved for NASAL specimens only), is one component of a comprehensive MRSA colonization surveillance program. It is not intended to diagnose MRSA infection nor to guide or monitor treatment for MRSA infections.     Radiology: Ct Abdomen Pelvis Wo Contrast  07/01/2016  CLINICAL DATA:  Lower extremity weakness. Cancer patient receiving chemotherapy and radiation therapy. EXAM: CT ABDOMEN WITHOUT CONTRAST CT PELVIS WITHOUt CONTRAST CT THORACIC SPINE CT LUMBAR SPINE TECHNIQUE: Multidetector CT imaging of the  abdomen was performed using the standard protocol. Multidetector CT imaging of the pelvis was performed following the standard protocol without intravenous contrast. Using the CT data thoracic and lumbar spine examinations or degenerated. COMPARISON:  CT SCAN 04/29/2016 FINDINGS: Lower chest: Streaky bibasilar atelectasis, right greater than left. The heart is normal in size for age. No pericardial effusion. Pacer wires are noted. Coronary artery calcifications. Hepatobiliary: No focal hepatic lesions or intrahepatic biliary dilatation. Layering small gallstones nerve gallbladder. No common bile duct dilatation. Pancreas: No mass, inflammation or ductal dilatation. Spleen: Normal size.  No focal lesions. Adrenals/Urinary Tract: Bilateral adrenal gland metastasis are again demonstrated. Renal cysts  are noted but no worrisome renal lesions or obstructing ureteral calculi. Chronic right UPJ obstruction. The bladder is decompressed by Foley catheter. Stomach/Bowel: The stomach, duodenum, small bowel and colon are grossly normal. No inflammatory changes, mass lesions or obstructive findings. The terminal ileum is normal. The appendix is normal. Vascular/Lymphatic: Advanced atherosclerotic calcifications involving the aorta and branch vessels. No mesenteric or retroperitoneal mass or lymphadenopathy. Other: No pelvic mass or adenopathy. No pre pelvic fluid collections. No inguinal mass or adenopathy. Musculoskeletal: No significant bony findings. THORACIC SPINE: Large right apical lung mass invading the chest wall and directly involving the first right and second ribs which are destroyed. There is also tumor destroying the right aspect of T1, T2 and T3. Pathologic compression fracture of T2. Mild canal compromise is also suspected but MR is recommended for further evaluation. A left upper lobe lung lesion is noted along with severe emphysematous changes. Advanced atherosclerotic calcifications involving the aorta but no aneurysm. Lumbar spine: The advanced degenerative lumbar spondylosis with multilevel disc disease and facet disease. T12 compression fracture is noted. No lumbar compression fractures. No obvious destructive bone metastasis. Multilevel disc disease and facet disease. IMPRESSION: 1. Large right apical/paraspinal mass directly invading the T1, T2 and T3 vertebral bodies on the right side and also destroying the right first and second ribs. There is canal encroachment at T2 but further evaluation with thoracic spine MRI without and with contrast suggested. 2. Bibasilar atelectasis, lung lesions and severe emphysema. 3. Bilateral adrenal gland metastasis. 4. Advanced atherosclerotic calcifications involving the aorta iliac arteries. 5. Remote compression fracture of T12. 6. No acute abdominal/pelvic  findings. Electronically Signed   By: Marijo Sanes M.D.   On: 07/01/2016 16:08   Ct Head Wo Contrast  07/01/2016  CLINICAL DATA:  Lower extremity weakness. Cancer patient receiving chemotherapy and radiation therapy. History of lung cancer. EXAM: CT HEAD WITHOUT CONTRAST TECHNIQUE: Contiguous axial images were obtained from the base of the skull through the vertex without intravenous contrast. COMPARISON:  None. FINDINGS: Brain: There is mild generalized age related parenchymal atrophy with commensurate dilatation of the ventricles and sulci. Mild chronic small vessel ischemic changes noted in the bilateral periventricular white matter regions. There is no mass, hemorrhage, edema or other evidence of acute parenchymal abnormality. No extra-axial hemorrhage. Vascular: No hyperdense vessel or unexpected calcification. There are chronic calcified atherosclerotic changes of the large vessels at the skull base. Skull: Negative for fracture or focal lesion. Sinuses/Orbits: No acute findings. Other: None. IMPRESSION: 1. No acute intracranial findings. No intracranial mass, hemorrhage or edema. 2. Chronic ischemic changes in the white matter. Electronically Signed   By: Franki Cabot M.D.   On: 07/01/2016 19:21   Ct Thoracic Spine Wo Contrast  07/02/2016  CLINICAL DATA:  Metastatic disease to the upper thoracic spine. Assess for canal compromise FLUOROSCOPY TIME:  The 1  minutes 0 seconds. 578.84 micro gray meter squared PROCEDURE: LUMBAR PUNCTURE FOR THORACIC MYELOGRAM After thorough discussion of risks and benefits of the procedure including bleeding, infection, injury to nerves, blood vessels, adjacent structures as well as headache and CSF leak, written and oral informed consent was obtained. Consent was obtained by Dr. Nelson Chimes. Patient was positioned prone on the fluoroscopy table. Local anesthesia was provided with 1% lidocaine without epinephrine after prepped and draped in the usual sterile fashion.  Puncture was performed at left L4-5 using a 3 1/2 inch 22-gauge spinal needle via left para median approach. Using a single pass through the dura, the needle was placed within the thecal sac, with return of clear CSF. 10 mL of Omnipaque-300 was injected into the thecal sac, with normal opacification of the nerve roots and cauda equina consistent with free flow within the subarachnoid space. The patient was then moved to the trendelenburg position and contrast flowed into the Thoracic spine region. I personally performed the lumbar puncture and administered the intrathecal contrast. I also personally performed acquisition of the myelogram images. TECHNIQUE: Contiguous axial images were obtained through the Thoracic spine after the intrathecal infusion of infusion. Coronal and sagittal reconstructions were obtained of the axial image sets. FINDINGS: THORACIC MYELOGRAM FINDINGS: Limited filming due to limited upper thoracic opacity. Patient has prominent spondylosis at the thoracolumbar junction region with old partial compression fractures and prominent anterior extradural defects. See results of the CT scan below. CT THORACIC MYELOGRAM FINDINGS: The patient was scanned initially supine and then in the decubitus position to get better contrast opacity in the upper thoracic region in this kyphotic individual. As previously seen, there is tumor involvement of the right side of the T1, T2 and T3 vertebral bodies due to direct invasion from the upper medial chest lesion. Tumor involves the intervertebral foramen on the right at T1-2 and T2-3. The T3-4 foramen is not compromised. There is a small amount of ventral epidural tumor at the T1 and T2 level, but this does not obliterate the subarachnoid space or have any compressive effect upon the cord at this moment. Aside from the vertebral body, there is involvement of the transverse process and proximal rib at T1 and the proximal rib at T2. No tumor or compressive canal  stenosis is seen below that region in the thoracic spine. The patient has pronounced degenerative spondylosis in the lower thoracic and upper lumbar region with indentation of the thecal sac. IMPRESSION: Direct tumor invasion of the right side of the T1, T2 and T3 vertebral bodies. Foraminal involvement on the right at T1-2 and T2-3. Small amount of ventral epidural tumor at T1 and T2 indents the thecal sac mildly but does not result in a block or cord compression. Electronically Signed   By: Nelson Chimes M.D.   On: 07/02/2016 16:45   Ct Thoracic Spine Wo Contrast  07/01/2016  CLINICAL DATA:  Lower extremity weakness. Cancer patient receiving chemotherapy and radiation therapy. EXAM: CT ABDOMEN WITHOUT CONTRAST CT PELVIS WITHOUt CONTRAST CT THORACIC SPINE CT LUMBAR SPINE TECHNIQUE: Multidetector CT imaging of the abdomen was performed using the standard protocol. Multidetector CT imaging of the pelvis was performed following the standard protocol without intravenous contrast. Using the CT data thoracic and lumbar spine examinations or degenerated. COMPARISON:  CT SCAN 04/29/2016 FINDINGS: Lower chest: Streaky bibasilar atelectasis, right greater than left. The heart is normal in size for age. No pericardial effusion. Pacer wires are noted. Coronary artery calcifications. Hepatobiliary: No focal hepatic lesions  or intrahepatic biliary dilatation. Layering small gallstones nerve gallbladder. No common bile duct dilatation. Pancreas: No mass, inflammation or ductal dilatation. Spleen: Normal size.  No focal lesions. Adrenals/Urinary Tract: Bilateral adrenal gland metastasis are again demonstrated. Renal cysts are noted but no worrisome renal lesions or obstructing ureteral calculi. Chronic right UPJ obstruction. The bladder is decompressed by Foley catheter. Stomach/Bowel: The stomach, duodenum, small bowel and colon are grossly normal. No inflammatory changes, mass lesions or obstructive findings. The terminal  ileum is normal. The appendix is normal. Vascular/Lymphatic: Advanced atherosclerotic calcifications involving the aorta and branch vessels. No mesenteric or retroperitoneal mass or lymphadenopathy. Other: No pelvic mass or adenopathy. No pre pelvic fluid collections. No inguinal mass or adenopathy. Musculoskeletal: No significant bony findings. THORACIC SPINE: Large right apical lung mass invading the chest wall and directly involving the first right and second ribs which are destroyed. There is also tumor destroying the right aspect of T1, T2 and T3. Pathologic compression fracture of T2. Mild canal compromise is also suspected but MR is recommended for further evaluation. A left upper lobe lung lesion is noted along with severe emphysematous changes. Advanced atherosclerotic calcifications involving the aorta but no aneurysm. Lumbar spine: The advanced degenerative lumbar spondylosis with multilevel disc disease and facet disease. T12 compression fracture is noted. No lumbar compression fractures. No obvious destructive bone metastasis. Multilevel disc disease and facet disease. IMPRESSION: 1. Large right apical/paraspinal mass directly invading the T1, T2 and T3 vertebral bodies on the right side and also destroying the right first and second ribs. There is canal encroachment at T2 but further evaluation with thoracic spine MRI without and with contrast suggested. 2. Bibasilar atelectasis, lung lesions and severe emphysema. 3. Bilateral adrenal gland metastasis. 4. Advanced atherosclerotic calcifications involving the aorta iliac arteries. 5. Remote compression fracture of T12. 6. No acute abdominal/pelvic findings. Electronically Signed   By: Marijo Sanes M.D.   On: 07/01/2016 16:08   Ct Lumbar Spine Wo Contrast  07/01/2016  CLINICAL DATA:  Lower extremity weakness. Cancer patient receiving chemotherapy and radiation therapy. EXAM: CT ABDOMEN WITHOUT CONTRAST CT PELVIS WITHOUt CONTRAST CT THORACIC SPINE CT  LUMBAR SPINE TECHNIQUE: Multidetector CT imaging of the abdomen was performed using the standard protocol. Multidetector CT imaging of the pelvis was performed following the standard protocol without intravenous contrast. Using the CT data thoracic and lumbar spine examinations or degenerated. COMPARISON:  CT SCAN 04/29/2016 FINDINGS: Lower chest: Streaky bibasilar atelectasis, right greater than left. The heart is normal in size for age. No pericardial effusion. Pacer wires are noted. Coronary artery calcifications. Hepatobiliary: No focal hepatic lesions or intrahepatic biliary dilatation. Layering small gallstones nerve gallbladder. No common bile duct dilatation. Pancreas: No mass, inflammation or ductal dilatation. Spleen: Normal size.  No focal lesions. Adrenals/Urinary Tract: Bilateral adrenal gland metastasis are again demonstrated. Renal cysts are noted but no worrisome renal lesions or obstructing ureteral calculi. Chronic right UPJ obstruction. The bladder is decompressed by Foley catheter. Stomach/Bowel: The stomach, duodenum, small bowel and colon are grossly normal. No inflammatory changes, mass lesions or obstructive findings. The terminal ileum is normal. The appendix is normal. Vascular/Lymphatic: Advanced atherosclerotic calcifications involving the aorta and branch vessels. No mesenteric or retroperitoneal mass or lymphadenopathy. Other: No pelvic mass or adenopathy. No pre pelvic fluid collections. No inguinal mass or adenopathy. Musculoskeletal: No significant bony findings. THORACIC SPINE: Large right apical lung mass invading the chest wall and directly involving the first right and second ribs which are destroyed. There is also  tumor destroying the right aspect of T1, T2 and T3. Pathologic compression fracture of T2. Mild canal compromise is also suspected but MR is recommended for further evaluation. A left upper lobe lung lesion is noted along with severe emphysematous changes. Advanced  atherosclerotic calcifications involving the aorta but no aneurysm. Lumbar spine: The advanced degenerative lumbar spondylosis with multilevel disc disease and facet disease. T12 compression fracture is noted. No lumbar compression fractures. No obvious destructive bone metastasis. Multilevel disc disease and facet disease. IMPRESSION: 1. Large right apical/paraspinal mass directly invading the T1, T2 and T3 vertebral bodies on the right side and also destroying the right first and second ribs. There is canal encroachment at T2 but further evaluation with thoracic spine MRI without and with contrast suggested. 2. Bibasilar atelectasis, lung lesions and severe emphysema. 3. Bilateral adrenal gland metastasis. 4. Advanced atherosclerotic calcifications involving the aorta iliac arteries. 5. Remote compression fracture of T12. 6. No acute abdominal/pelvic findings. Electronically Signed   By: Marijo Sanes M.D.   On: 07/01/2016 16:08   US Renal  07/01/2016  CLINICAL DATA:  Acute renal failure EXAM: RENAL / URINARY TRACT ULTRASOUND COMPLETE COMPARISON:  CT scan July 01, 2016 FINDINGS: Right Kidney: Length: 13.5 cm. There is a large cyst measuring greater than 6 cm in the right kidney, also seen on the recent CT scan. No hydronephrosis. Left Kidney: Length: 12.3 cm. Echogenicity within normal limits. No mass or hydronephrosis visualized. Bladder: The bladder is decompressed with a Foley catheter. IMPRESSION: 1. No hydronephrosis. Mild increased echogenicity associated with both renal cortices, likely medical renal disease. Electronically Signed   By: Dorise Bullion III M.D   On: 07/01/2016 23:16   Dg Abd Acute W/chest  07/01/2016  CLINICAL DATA:  Loose stools. EXAM: DG ABDOMEN ACUTE W/ 1V CHEST COMPARISON:  06/12/2016 FINDINGS: The upright chest x-ray demonstrates cardiac enlargement and stable surgical changes from bypass surgery. There is tortuosity and calcification of the thoracic aorta. Right-sided PICC line  is in good position with its tip in the mid SVC. The pacer wires well position. No infiltrates or effusions. Two views of the abdomen demonstrate scattered air and stool throughout the colon. There are also air-filled loops of small bowel without distention or air-fluid levels. Moderate aortic and iliac artery calcifications. IMPRESSION: No acute cardiopulmonary findings. Moderate stool throughout the. No findings for small bowel obstruction or free air. Electronically Signed   By: Marijo Sanes M.D.   On: 07/01/2016 13:34   Dg Myelography Lumbar Inj Thoracic  07/02/2016  CLINICAL DATA:  Metastatic disease to the upper thoracic spine. Assess for canal compromise FLUOROSCOPY TIME:  The 1 minutes 0 seconds. 578.84 micro gray meter squared PROCEDURE: LUMBAR PUNCTURE FOR THORACIC MYELOGRAM After thorough discussion of risks and benefits of the procedure including bleeding, infection, injury to nerves, blood vessels, adjacent structures as well as headache and CSF leak, written and oral informed consent was obtained. Consent was obtained by Dr. Nelson Chimes. Patient was positioned prone on the fluoroscopy table. Local anesthesia was provided with 1% lidocaine without epinephrine after prepped and draped in the usual sterile fashion. Puncture was performed at left L4-5 using a 3 1/2 inch 22-gauge spinal needle via left para median approach. Using a single pass through the dura, the needle was placed within the thecal sac, with return of clear CSF. 10 mL of Omnipaque-300 was injected into the thecal sac, with normal opacification of the nerve roots and cauda equina consistent with free flow within the subarachnoid  space. The patient was then moved to the trendelenburg position and contrast flowed into the Thoracic spine region. I personally performed the lumbar puncture and administered the intrathecal contrast. I also personally performed acquisition of the myelogram images. TECHNIQUE: Contiguous axial images were  obtained through the Thoracic spine after the intrathecal infusion of infusion. Coronal and sagittal reconstructions were obtained of the axial image sets. FINDINGS: THORACIC MYELOGRAM FINDINGS: Limited filming due to limited upper thoracic opacity. Patient has prominent spondylosis at the thoracolumbar junction region with old partial compression fractures and prominent anterior extradural defects. See results of the CT scan below. CT THORACIC MYELOGRAM FINDINGS: The patient was scanned initially supine and then in the decubitus position to get better contrast opacity in the upper thoracic region in this kyphotic individual. As previously seen, there is tumor involvement of the right side of the T1, T2 and T3 vertebral bodies due to direct invasion from the upper medial chest lesion. Tumor involves the intervertebral foramen on the right at T1-2 and T2-3. The T3-4 foramen is not compromised. There is a small amount of ventral epidural tumor at the T1 and T2 level, but this does not obliterate the subarachnoid space or have any compressive effect upon the cord at this moment. Aside from the vertebral body, there is involvement of the transverse process and proximal rib at T1 and the proximal rib at T2. No tumor or compressive canal stenosis is seen below that region in the thoracic spine. The patient has pronounced degenerative spondylosis in the lower thoracic and upper lumbar region with indentation of the thecal sac. IMPRESSION: Direct tumor invasion of the right side of the T1, T2 and T3 vertebral bodies. Foraminal involvement on the right at T1-2 and T2-3. Small amount of ventral epidural tumor at T1 and T2 indents the thecal sac mildly but does not result in a block or cord compression. Electronically Signed   By: Nelson Chimes M.D.   On: 07/02/2016 16:45    Medications:   . amiodarone  200 mg Oral BID  . aspirin EC  81 mg Oral Daily  . atorvastatin  80 mg Oral Daily  . dexamethasone  4 mg Intravenous  TID  . feeding supplement (ENSURE ENLIVE)  237 mL Oral BID BM  . gabapentin  300 mg Oral QHS  . Gerhardt's butt cream   Topical TID  . insulin aspart  0-15 Units Subcutaneous Q4H  . metoprolol tartrate  75 mg Oral BID  . pantoprazole  40 mg Oral Daily  . potassium chloride  20 mEq Oral Daily  . sodium chloride flush  10-40 mL Intracatheter Q12H  . sodium chloride flush  3 mL Intravenous Q12H  . sucralfate  1 g Oral TID WC & HS  . warfarin  5 mg Oral Once  . warfarin  5 mg Oral ONCE-1800  . Warfarin - Pharmacist Dosing Inpatient   Does not apply q1800   Continuous Infusions:      LOS: 2 days   [facility], DAWOOD MD [facility] Pager [phone].  *Please refer to [email or web address], password [email or web address] to get updated schedule on who will round on this patient, as hospitalists switch teams weekly. If 7PM-7AM, please contact night-coverage at [email or web address], password [email or web address] for any overnight needs.  07/03/2016, 11:32 AM

## 2016-07-03 NOTE — Progress Notes (Signed)
Received call from [place] to notify attending MD that Pt will be transported via [facility]  At 1400 to the [facility] for Radiation treatment. Dr Quentin Ore paged and made aware,

## 2016-07-03 NOTE — Consult Note (Signed)
Please see WOC consult from 07/02/16; not sure the areas are pressure related due to presentation.  See notes.  Yenni Carra Paxton RN,CWOCN [phone]

## 2016-07-04 ENCOUNTER — Ambulatory Visit (HOSPITAL_COMMUNITY): Payer: Medicare Other | Admitting: Oncology

## 2016-07-04 ENCOUNTER — Encounter: Payer: Self-pay | Admitting: Radiation Oncology

## 2016-07-04 ENCOUNTER — Ambulatory Visit
Admission: RE | Admit: 2016-07-04 | Discharge: 2016-07-04 | Disposition: A | Discharge status: Home / Self Care | Payer: Medicare Other | Source: Ambulatory Visit | Attending: Radiation Oncology | Admitting: Radiation Oncology

## 2016-07-04 ENCOUNTER — Ambulatory Visit (HOSPITAL_COMMUNITY): Payer: Medicare Other | Admitting: Hematology & Oncology

## 2016-07-04 ENCOUNTER — Ambulatory Visit: Payer: Medicare Other

## 2016-07-04 ENCOUNTER — Inpatient Hospital Stay (HOSPITAL_COMMUNITY): Payer: Medicare Other

## 2016-07-04 ENCOUNTER — Ambulatory Visit (HOSPITAL_COMMUNITY): Payer: Medicare Other

## 2016-07-04 ENCOUNTER — Ambulatory Visit
Admit: 2016-07-04 | Discharge: 2016-07-04 | Disposition: A | Discharge status: Home / Self Care | Payer: Medicare Other | Attending: Radiation Oncology | Admitting: Radiation Oncology

## 2016-07-04 VITALS — BP 124/67 | HR 66 | Resp 16

## 2016-07-04 DIAGNOSIS — C3411 Malignant neoplasm of upper lobe, right bronchus or lung: Secondary | ICD-10-CM

## 2016-07-04 LAB — CBC
HEMATOCRIT: 26.9 % — AB (ref 39.0–52.0)
Hemoglobin: 8.6 g/dL — ABNORMAL LOW (ref 13.0–17.0)
MCH: 29.4 pg (ref 26.0–34.0)
MCHC: 32 g/dL (ref 30.0–36.0)
MCV: 91.8 fL (ref 78.0–100.0)
Platelets: 116 10*3/uL — ABNORMAL LOW (ref 150–400)
RBC: 2.93 MIL/uL — ABNORMAL LOW (ref 4.22–5.81)
RDW: 18.9 % — AB (ref 11.5–15.5)
WBC: 4 10*3/uL (ref 4.0–10.5)

## 2016-07-04 LAB — GLUCOSE, CAPILLARY
GLUCOSE-CAPILLARY: 199 mg/dL — AB (ref 65–99)
GLUCOSE-CAPILLARY: 244 mg/dL — AB (ref 65–99)
Glucose-Capillary: 190 mg/dL — ABNORMAL HIGH (ref 65–99)
Glucose-Capillary: 184 mg/dL — ABNORMAL HIGH (ref 65–99)
Glucose-Capillary: 257 mg/dL — ABNORMAL HIGH (ref 65–99)

## 2016-07-04 LAB — BASIC METABOLIC PANEL
Anion gap: 5 (ref 5–15)
BUN: 27 mg/dL — AB (ref 6–20)
CALCIUM: 8.9 mg/dL (ref 8.9–10.3)
CO2: 28 mmol/L (ref 22–32)
CREATININE: 1.08 mg/dL (ref 0.61–1.24)
Chloride: 104 mmol/L (ref 101–111)
GFR calc non Af Amer: >60 mL/min (ref >60)
GLUCOSE: 200 mg/dL — AB (ref 65–99)
Potassium: 4.2 mmol/L (ref 3.5–5.1)
Sodium: 137 mmol/L (ref 135–145)

## 2016-07-04 MED ORDER — GUAIFENESIN-DM 100-10 MG/5ML PO SYRP
5.0000 mL | ORAL_SOLUTION | ORAL | Status: DC | PRN
  Administered 2016-07-04 – 2016-07-06 (×7): 5 mL via ORAL
  Filled 2016-07-04 (×7): qty 10

## 2016-07-04 MED ORDER — DEXAMETHASONE SODIUM PHOSPHATE 4 MG/ML IJ SOLN
4.0000 mg | Freq: Three times a day (TID) | INTRAMUSCULAR | Status: DC
  Administered 2016-07-04 – 2016-07-06 (×6): 4 mg via INTRAVENOUS
  Filled 2016-07-04 (×6): qty 1

## 2016-07-04 NOTE — Progress Notes (Signed)
Patient hospitalized at [facility].

## 2016-07-04 NOTE — Clinical Social Work Note (Signed)
Clinical Social Work Assessment  Patient Details  Name: George Pollard MRN: [id number] Date of Birth: 04-08-1933  Date of referral:  07/04/16               Reason for consult:  Facility Placement                Permission sought to share information with:  Chartered certified accountant granted to share information::  Yes, Verbal Permission Granted  Name::        Agency::     Relationship::     Contact Information:     Housing/Transportation Living arrangements for the past 2 months:  Single Family Home Source of Information:  Patient, Adult Children Patient Interpreter Needed:  None Criminal Activity/Legal Involvement Pertinent to Current Situation/Hospitalization:  No - Comment as needed Significant Relationships:  Adult Children, Spouse Lives with:  Spouse Do you feel safe going back to the place where you live?  Yes Need for family participation in patient care:  Yes (Comment)  Care giving concerns:  CSW reviewed PT evaluation recommending SNF at discharge.    Social Worker assessment / plan:  CSW spoke with patient & son, George Pollard at bedside re: discharge planning.   Employment status:  Retired Nurse, adult PT Recommendations:  [place] / Referral to community resources:  [place]  Patient/Family's Response to care:  Patient was hesitant to agree to SNF, states that he would like to discuss with his wife before making a decision but did agree to have information sent out to [name] and [name] in [place]. CSW will await call from patient/wife when decision has been made re: home vs. SNF at discharge.   Patient/Family's Understanding of and Emotional Response to Diagnosis, Current Treatment, and Prognosis:    Emotional Assessment Appearance:  Appears stated age Attitude/Demeanor/Rapport:    Affect (typically observed):    Orientation:  Oriented to Self, Oriented to Place, Oriented to   Time, Oriented to Situation Alcohol / Substance use:    Psych involvement (Current and /or in the community):     Discharge Needs  Concerns to be addressed:    Readmission within the last 30 days:    Current discharge risk:    Barriers to Discharge:      George Brooking, LCSW 07/04/2016, 2:45 PM

## 2016-07-04 NOTE — Progress Notes (Signed)
Occupational Therapy Evaluation Patient Details Name: George Pollard MRN: [id number] DOB: 11-15-1933 Today's Date: 07/04/2016    History of Present Illness George Pollard is an 82 y.o. male with a PMH of CAD status post CABG, chronic atrial fibrillation, squamous cell carcinoma of the lung diagnosed 5/17 status post radiation therapy, currently on weekly carboplatin and paclitaxel who was admitted 07/01/16 with a chief complaint of a one-week history of bilateral leg and right arm weakness. A CT scan done on admission showed a paraspinal mass.    Clinical Impression   Patient presents to OT with decreased ADL independence and safety due to the deficits listed below. He will benefit from skilled OT to maximize function and to facilitate a safe discharge. OT will follow.    Follow Up Recommendations  SNF;Supervision/Assistance - 24 hour  (pt is hopeful to return home with [facility] therapies but willing to go to SNF if he does not progress quickly)   Equipment Recommendations  Tub/shower bench    Recommendations for Other Services       Precautions / Restrictions Precautions Precautions: Fall Precaution Comments: fell prior to admission per chart Restrictions Weight Bearing Restrictions: No      Mobility Bed Mobility Overal bed mobility: Needs Assistance;+ 2 for safety/equipment Bed Mobility: Rolling;Sidelying to Sit Rolling: Min assist Sidelying to sit: Mod assist;+2 for safety/equipment;+2 for physical assistance;HOB elevated       General bed mobility comments: assist for BLEs off bed and trunk elevation  Transfers Overall transfer level: Needs assistance Equipment used: Rolling walker (2 wheeled) Transfers: Sit to/from Stand Sit to Stand: Min assist;+2 safety/equipment;From elevated surface         General transfer comment: assist to stabilize, close guard/assist for safety    Balance                                            ADL Overall  ADL's : Needs assistance/impaired Eating/Feeding: Minimal assistance;Bed level           Lower Body Bathing: Total assistance;Bed level       Lower Body Dressing: Total assistance;Bed level Lower Body Dressing Details (indicate cue type and reason): don socks Toilet Transfer: Minimal assistance;+2 for safety/equipment;Stand-pivot;BSC Toilet Transfer Details (indicate cue type and reason): simulated sit to stand from bed with a few steps sideways/forward/backward with RW Toileting- Clothing Manipulation and Hygiene: Total assistance;+2 for safety/equipment;Sit to/from stand       Functional mobility during ADLs: Minimal assistance;Moderate assistance;+2 for safety/equipment;Rolling walker       Vision     Perception     Praxis      Pertinent Vitals/Pain Pain Assessment: Faces Faces Pain Scale: Hurts even more Pain Location: sacral wounds with mobility/sitting Pain Descriptors / Indicators: Discomfort;Grimacing;Guarding Pain Intervention(s): Limited activity within patient's tolerance;Monitored during session;Repositioned     Hand Dominance Right   Extremity/Trunk Assessment Upper Extremity Assessment Upper Extremity Assessment: RUE deficits/detail;Generalized weakness RUE Deficits / Details: RUE weakness from cancer; distal weakness > proximal weakness. AROM shoulder/elbow/wrist WFL but weak, AAROM digits ok, weak pinch and grip   Lower Extremity Assessment Lower Extremity Assessment: Defer to PT evaluation       Communication Communication Communication: Other (comment) (hoarseness/speaks in whisper)   Cognition Arousal/Alertness: Awake/alert Behavior During Therapy: WFL for tasks assessed/performed Overall Cognitive Status: Within Functional Limits for tasks assessed  General Comments       Exercises       Shoulder Instructions      Home Living Family/patient expects to be discharged to:: Private residence Living  Arrangements: Spouse/significant other Available Help at Discharge: Family (however, wife cannot provide a lot of physical assistance) Type of Home: House             Bathroom Shower/Tub: Teacher, [employer] [place] years/pre: Standard Bathroom Accessibility: Yes How Accessible: Accessible via walker Home Equipment: George Pollard - 2 wheels;Cane - single point;Bedside commode;Wheelchair - manual   Additional Comments: pt reports he was in the process of getting a hospital bed      Prior Functioning/Environment Level of Independence: Needs assistance  Gait / Transfers Assistance Needed: amb with cane, then RW, the w/c bound last 2 weeks ADL's / Homemaking Assistance Needed: bowel/bladder incontinence  per pt in last 2 weeks        OT Diagnosis: Generalized weakness;Acute pain   OT Problem List: Decreased strength;Decreased activity tolerance;Impaired balance (sitting and/or standing);Decreased safety awareness;Decreased knowledge of use of DME or AE;Impaired UE functional use;Pain   OT Treatment/Interventions: Self-care/ADL training;Therapeutic exercise;DME and/or AE instruction;Therapeutic activities;Patient/family education    OT Goals(Current goals can be found in the care plan section) Acute Rehab OT Goals Patient Stated Goal: to walk OT Goal Formulation: With patient Time For Goal Achievement: 07/18/16 Potential to Achieve Goals: Good ADL Goals Pt Will Perform Upper Body Bathing: with set-up;sitting Pt Will Perform Lower Body Bathing: with min assist;sit to/from stand Pt Will Perform Upper Body Dressing: with set-up;sitting Pt Will Perform Lower Body Dressing: with min assist;sit to/from stand Pt Will Transfer to Toilet: with min assist;bedside commode Pt Will Perform Toileting - Clothing Manipulation and hygiene: sit to/from stand;with mod assist Pt Will Perform Tub/Shower Transfer: Tub transfer;with min assist;with caregiver independent in assisting;tub bench;rolling walker   OT Frequency: Min 2X/week   Barriers to D/C: Decreased caregiver support  wife cannot provide a lot of physical assistance       Co-evaluation              End of Session Equipment Utilized During Treatment: Rolling walker Nurse Communication: Mobility status  Activity Tolerance: Patient tolerated treatment well Patient left: in bed;with call bell/phone within reach;with bed alarm set;with family/visitor present   Time: 5974-1638 OT Time Calculation (min): 21 min Charges:  OT General Charges $OT Visit: 1 Procedure OT Evaluation $OT Eval Moderate Complexity: 1 Procedure G-Codes:    Carolyne Whitsel A 07/17/2016, 12:38 PM

## 2016-07-04 NOTE — Progress Notes (Signed)
  Radiation Oncology         [phone]   Name: George Pollard MRN: [id number]   Date: 07/04/2016  DOB: March 05, 1933     Weekly Radiation Therapy Management    ICD-9-CM ICD-10-CM   1. Pancoast tumor of right lung (HCC) 162.3 C34.11     Current Dose: 52 Gy  Planned Dose:  66 Gy  Narrative The patient presents for routine under treatment assessment.  Inpatient. The patient has been admitted to the hospital since 07/01/16 for lower extremity weakness. CT of the spine and myelography of the lumbar and thoracic spine on 07/02/16 showed that athough there is a small amount of  Ventral epidural tumor at T1 and T2 that indents the thecal sack mildly, this does not result in a block or cord compression.  Reports pain at brachial plexus has resolved. Reports new onset of pain in the right posterior scapula. Reports productive cough with tan sputum. Denies hemoptysis. Audible congestion noted by the nurse. Reports difficulty swallowing only meat, bread, and water. No skin changes noted within treatment field. Reports shortness of breath with exertion. Reports fatigue. However, reports he was able to push up from his bed today and ambulate when he couldn't Monday. He reports leg numbness and bowel incontinence. Patient noted to be taking decadron 4 mg tid.  Set-up films were reviewed. The chart was checked.  Physical Findings  blood pressure is 124/67 and his pulse is 66. His respiration is 16 and oxygen saturation is 100%. . Weight essentially stable. The patient is bed bound, respiratory effort unremarkable, motor strength intact throughout, hoarse voice.  Impression The patient is tolerating radiation relatively well. However given recent events, he may benefit from a break from systemic treatment.  Plan Continue treatment as planned. I will discuss with Dr. Whitney Pollard about giving the patient a break from systemic treatment.     George Pollard George Pollard, M.D.  This document serves as a record of  services personally performed by George Pita, MD. It was created on his behalf by George Pollard, a trained medical scribe. The creation of this record is based on the scribe's personal observations and the provider's statements to them. This document has been checked and approved by the attending provider.

## 2016-07-04 NOTE — Progress Notes (Signed)
Inpatient Diabetes Program Recommendations  AACE/ADA: New Consensus Statement on Inpatient Glycemic Control (2015)  Target Ranges:  Prepandial:   less than 140 mg/dL      Peak postprandial:   less than 180 mg/dL (1-2 hours)      Critically ill patients:  140 - 180 mg/dL   Lab Results  Component Value Date   GLUCAP 184* 07/04/2016   HGBA1C 7.8* 07/02/2016  Results for George Pollard, George Pollard (MRN [id number]) as of 07/04/2016 10:59  Ref. Range 07/03/2016 16:40 07/03/2016 20:43 07/03/2016 23:56 07/04/2016 03:48 07/04/2016 07:42  Glucose-Capillary Latest Ref Range: 65-99 mg/dL 256 (H) 233 (H) 155 (H) 190 (H) 184 (H)    Review of Glycemic Control  Inpatient Diabetes Program Recommendations:   Insulin - Basal: If steroids are continued, please consider ordering low dose basal insulin. Recommend starting with Lantus 10 units Q24H starting now (based on 92.9 kg x 0.1 units). Correction (SSI): Change Novolog to moderate tidwc and hs HgbA1C: A1C is 7.8%.  Will continue to follow. Thank you. Lorenda Peck, RD, LDN, CDE Inpatient Diabetes Coordinator [phone]

## 2016-07-04 NOTE — Progress Notes (Signed)
Informed patient he had an order for a mattress replacement to an air overlay mattress but patient is refusing to switch beds at this time.  Explained the importance of this bed to patient and wife especially since patient has numerous wounds on bottom but patient is still refusing.

## 2016-07-04 NOTE — Evaluation (Signed)
Physical Therapy Evaluation Patient Details Name: KEVONTAY BURKS MRN: [id number] DOB: Dec 11, 1933 Today's Date: 07/04/2016   History of Present Illness  82 yo male admitted with paraspinal mass with bilateral leg and right arm weakness  CT myelogram significant for Direct tumor invasion of the right side of the T1, T2 and T3 vertebral bodies. Hx of CAD s/p CABG, Chronic afib , Lung mass (squamous cell),   Clinical Impression  On eval, pt required Mod assist +2 for bed mobility and Min assist +2 to stand and take a few steps with RW. Pt c/o sacral wound pain mostly. He tolerated mobility fairly well during session. Discussed d/c plan-pt/family open to ST rehab at SNF if necessary. Limited assistance available from children who work. Pt would need to be considerably stronger and much more mobile to safely d/c home with wife. Will follow and progress activity as tolerated.     Follow Up Recommendations SNF     Equipment Recommendations  None recommended by PT    Recommendations for Other Services       Precautions / Restrictions Precautions Precautions: Fall Precaution Comments: fell prior to admission per chart Restrictions Weight Bearing Restrictions: No      Mobility  Bed Mobility Overal bed mobility: Needs Assistance Bed Mobility: Rolling;Sidelying to Sit;Sit to Sidelying Rolling: Min assist Sidelying to sit: Mod assist;+2 for physical assistance;+2 for safety/equipment     Sit to sidelying: Mod assist General bed mobility comments: assist for BLEs off bed and trunk elevation  Transfers Overall transfer level: Needs assistance Equipment used: Rolling walker (2 wheeled) Transfers: Sit to/from Stand Sit to Stand: Min assist;+2 safety/equipment;From elevated surface         General transfer comment: Assist to rise, stabilize, control descent. VCs safety, technique, hand placement.   Ambulation/Gait Ambulation/Gait assistance: Min assist;+2 physical assistance;+2  safety/equipment Ambulation Distance (Feet): 3 Feet Assistive device: Rolling walker (2 wheeled) Gait Pattern/deviations: Step-to pattern;Trunk flexed;Decreased step length - left;Decreased step length - right     General Gait Details: Pt took 3 steps forward then 3 steps backwards with RW.   Stairs            Wheelchair Mobility    Modified Rankin (Stroke Patients Only)       Balance Overall balance assessment: Needs assistance         Standing balance support: Bilateral upper extremity supported;During functional activity Standing balance-Leahy Scale: Poor                               Pertinent Vitals/Pain Pain Assessment: Faces Faces Pain Scale: Hurts even more Pain Location: sacral wound Pain Descriptors / Indicators: Discomfort;Grimacing;Guarding Pain Intervention(s): Limited activity within patient's tolerance;Monitored during session;Repositioned    Home Living Family/patient expects to be discharged to:: Private residence Living Arrangements: Spouse/significant other Available Help at Discharge: Family (however, wife cannot provide a lot of physical assistance) Type of Home: House         Home Equipment: Gilford Rile - 2 wheels;Cane - single point;Bedside commode;Wheelchair - manual Additional Comments: pt reports he was in the process of getting a hospital bed    Prior Function Level of Independence: Needs assistance   Gait / Transfers Assistance Needed: amb with cane, then RW, the w/c bound last 2 weeks  ADL's / Homemaking Assistance Needed: bowel/bladder incontinence  per pt in last 2 weeks        Hand Dominance   Dominant Hand: Right  Extremity/Trunk Assessment   Upper Extremity Assessment: Defer to OT evaluation RUE Deficits / Details: RUE weakness from cancer; distal weakness > proximal weakness. AROM shoulder/elbow/wrist WFL but weak, AAROM digits ok, weak pinch and grip         Lower Extremity Assessment: RLE  deficits/detail;LLE deficits/detail;Generalized weakness      Cervical / Trunk Assessment: Normal  Communication   Communication: Other (comment) (hoarseness/speaks in whisper)  Cognition Arousal/Alertness: Awake/alert Behavior During Therapy: WFL for tasks assessed/performed Overall Cognitive Status: Within Functional Limits for tasks assessed                      General Comments General comments (skin integrity, edema, etc.): sacral wound dressings, skin easily tears/bruises    Exercises        Assessment/Plan    PT Assessment Patient needs continued PT services  PT Diagnosis Difficulty walking;Generalized weakness   PT Problem List Decreased skin integrity;Decreased mobility;Decreased balance;Decreased activity tolerance;Decreased strength;Pain;Decreased knowledge of use of DME  PT Treatment Interventions DME instruction;Gait training;Functional mobility training;Therapeutic activities;Patient/family education;Balance training;Therapeutic exercise   PT Goals (Current goals can be found in the Care Plan section) Acute Rehab PT Goals Patient Stated Goal: to walk PT Goal Formulation: With patient/family Time For Goal Achievement: 07/18/16 Potential to Achieve Goals: Good    Frequency Min 3X/week   Barriers to discharge        Co-evaluation               End of Session   Activity Tolerance: Patient tolerated treatment well Patient left: in bed;with call bell/phone within reach;with bed alarm set;with family/visitor present           Time: 5945-8592 PT Time Calculation (min) (ACUTE ONLY): 20 min   Charges:   PT Evaluation $PT Eval Moderate Complexity: 1 Procedure     PT G Codes:        Weston Anna, MPT Pager: [phone]

## 2016-07-04 NOTE — Care Management Note (Signed)
Case Management Note  Patient Details  Name: George Pollard MRN: [id number] Date of Birth: Jun 13, 1933  Subjective/Objective:  Transfer from [facility]. Hx: Lung Ca. Paraspinal mass.neuro following-CT myelogram, iv decadron.for xrt.PT-recc SNF-CSW following.                  Action/Plan:d/c plan SNF.   Expected Discharge Date:                  Expected Discharge Plan:  Skilled Nursing Facility  In-House Referral:  Clinical Social Work  Discharge planning Services  CM Consult  Post Acute Care Choice:    Choice offered to:     DME Arranged:    DME Agency:     HH Arranged:    [place] Agency:     Status of Service:  In process, will continue to follow  If discussed at Long Length of Stay Meetings, dates discussed:    Additional Comments:  Dessa Phi, RN 07/04/2016, 3:44 PM

## 2016-07-04 NOTE — Progress Notes (Signed)
[place] Radiation Oncology Dept Therapy Treatment Record Phone [phone]   Radiation Therapy was administered to George Pollard on: 07/04/2016  3:17 PM and was treatment # 26 out of a planned course of 33 treatments.  Radiation Treatment  1). Beam photons with 6-10 energy and Photons 6-10 MeV  2). Brachytherapy None  3). Stereotactic Radiosurgery None  4). Other Radiation None     [facility], Rad Therap

## 2016-07-04 NOTE — Progress Notes (Signed)
Progress Note    George Pollard  QJJ:[id number] DOB: 05/21/1933  DOA: 07/01/2016 PCP: Monico Blitz, MD    Brief Narrative:   George Pollard is an 82 y.o. male with a PMH of CAD status post CABG, chronic atrial fibrillation, squamous cell carcinoma of the lung diagnosed 5/17 status post radiation therapy, currently on weekly carboplatin and paclitaxel who was admitted 07/01/16 with a chief complaint of a one-week history of bilateral leg and right arm weakness. A CT scan done on admission showed a paraspinal mass. Patient unable to have MRI secondary to pacer/defibrillator in situ. Transferred from [facility] to [facility] for CT myelogram per neurosurgery recommendations, Which was significant for Direct tumor invasion of the right side of the T1, T2 and T3 vertebral bodies. But no block or cord compression.   Assessment/Plan:   Principal Problem:   Paraspinal mass with bilateral leg and right arm weakness in the setting of metastatic squamous cell carcinoma of the lung/Pancoast tumor Findings concerning for metastatic cancer. There is also evidence of adrenal gland metastasis on imaging. CT of the head negative for acute intracranial findings including mass. CT myelogram significant for Direct tumor invasion of the right side of the T1, T2 and T3 vertebral bodies. But no block or cord compression, will continue with radiation therapy. Patient transition to [facility] where he awaits radiation therapy.  Anti-neoplastic chemotherapy-induced pancytopenia Monitor blood counts closely. No current indication for transfusion. - Stable currently    Type 2 diabetes with diabetic neuropathy Metformin on hold secondary to renal insufficiency. Currently being managed with insulin moderate scale .Monitor glycemic control closely with initiation of steroid therapy. Continue Neurontin for diabetic neuropathy. Follow-up hemoglobin A1c is 7.8.    Chronic atrial fibrillation Continue amiodarone and Coumadin per  pharmacy. INR subtherapeutic at present.D/W with oncology Dr. Whitney Muse there is no contraindication for anticoagulation. Warfarin has been stopped as an outpatient for nasal bleed, then held secondary to nasal bleed. May restart the next 1-2 days.    Coronary artery disease with history of CABG Continue aspirin, metoprolol and statin.    Acute kidney injury/hypotension Baseline creatinine 1.09. Creatinine 1.89 on admission. Improving with gentle hydration. Continue to hold metformin, Lasix and lisinopril. Renal ultrasound negative for hydronephrosis.    Edema Patient has hypoalbuminemia.    Urinary retention On flomax will try voiding trial today.     Pressure ulcer, stage III  wound care nurse consulted.   Family Communication/Anticipated D/C date and plan/Code Status   DVT prophylaxis: Coumadin Is currently on hold, will start on subcutaneous heparin. Code Status: DNR, confirmed by wife Family Communication: Wife and daughters at the bedside. Disposition Plan: Undetermined. Will transfer [facility], for continuous need of radiation therapy, PT consult pending, but it likely will need SNF.   Discussed with radiation oncology Dr. Tammi Klippel via phone, discussed with patient primary oncologist Dr. Whitney Muse via phone Procedures:   CT myelogram 07/02/16   Anti-Infectives:   None.  Subjective:    George Pollard  Pt has no new complaints. No acute issues overnight.   Objective:    Filed Vitals:   07/03/16 1300 07/03/16 1548 07/03/16 2045 07/04/16 0556  BP: 109/78 95/63 119/64 103/63  Pulse: 84 96 85 69  Temp:  98.1 F (36.7 C) 97.9 F (36.6 C) 97.6 F (36.4 C)  TempSrc:  Oral Oral Oral  Resp: '21 20 20 18  '$ Height:  '5\' 10"'$  (1.778 m)    Weight:  97.1 kg (214 lb 1.1  oz)  96.6 kg (212 lb 15.4 oz)  SpO2: 100% 100% 96% 100%    Intake/Output Summary (Last 24 hours) at 07/04/16 1231 Last data filed at 07/04/16 0700  Gross per 24 hour  Intake    360 ml  Output    900 ml  Net    -540 ml   Filed Weights   07/03/16 0400 07/03/16 1548 07/04/16 0556  Weight: 91.853 kg (202 lb 8 oz) 97.1 kg (214 lb 1.1 oz) 96.6 kg (212 lb 15.4 oz)    Exam: General exam: Appears calm and comfortable.  Respiratory system: Clear to auscultation. Respiratory effort normal. Cardiovascular system: HSIR. No JVD,  rubs, gallops or clicks. No murmurs. Gastrointestinal system: Abdomen is nondistended, soft and nontender. No organomegaly or masses felt. Normal bowel sounds heard. Central nervous system: Awake and alert answering questions, no facial asymmetry, moves extremities Extremities: Lower extremity venous stasis with 2+ pitting edema. Skin: Warm and dry. Psychiatry: Judgement and insight appear normal. Mood & affect appropriate.   Data Reviewed:   I have personally reviewed following labs and imaging studies:  Labs: Basic Metabolic Panel:  Recent Labs Lab 07/01/16 1250 07/02/16 0100 07/04/16 0435  NA 136 137 137  K 3.8 3.8 4.2  CL 103 103 104  CO2 '26 27 28  '$ GLUCOSE 180* 255* 200*  BUN 27* 20 27*  CREATININE 1.81* 1.56* 1.08  CALCIUM 8.4* 8.6* 8.9   GFR Estimated Creatinine Clearance: 63.6 mL/min (by C-G formula based on Cr of 1.08). Liver Function Tests:  Recent Labs Lab 07/01/16 1250 07/02/16 0100  AST 21 19  ALT 27 27  ALKPHOS 69 66  BILITOT 2.4* 2.0*  PROT 5.4* 5.2*  ALBUMIN 2.6* 2.4*   Coagulation profile  Recent Labs Lab 07/01/16 1250 07/02/16 0100 07/03/16 0430  INR 1.43 1.37 1.35    CBC:  Recent Labs Lab 07/01/16 1250 07/02/16 0100 07/04/16 0435  WBC 4.5 3.6* 4.0  NEUTROABS 3.3 3.3  --   HGB 8.9* 8.6* 8.6*  HCT 27.2* 27.1* 26.9*  MCV 90.1 90.9 91.8  PLT 152 134* 116*   Cardiac Enzymes:  Recent Labs Lab 07/01/16 1250  TROPONINI <0.03   CBG:  Recent Labs Lab 07/03/16 1640 07/03/16 2043 07/03/16 2356 07/04/16 0348 07/04/16 0742  GLUCAP 256* 233* 155* 190* 184*   Microbiology Recent Results (from the past 240 hour(s))   MRSA PCR Screening     Status: None   Collection Time: 07/01/16 10:41 PM  Result Value Ref Range Status   MRSA by PCR NEGATIVE NEGATIVE Final    Comment:        The GeneXpert MRSA Assay (FDA approved for NASAL specimens only), is one component of a comprehensive MRSA colonization surveillance program. It is not intended to diagnose MRSA infection nor to guide or monitor treatment for MRSA infections.     Radiology: Ct Thoracic Spine Wo Contrast  07/02/2016  CLINICAL DATA:  Metastatic disease to the upper thoracic spine. Assess for canal compromise FLUOROSCOPY TIME:  The 1 minutes 0 seconds. 578.84 micro gray meter squared PROCEDURE: LUMBAR PUNCTURE FOR THORACIC MYELOGRAM After thorough discussion of risks and benefits of the procedure including bleeding, infection, injury to nerves, blood vessels, adjacent structures as well as headache and CSF leak, written and oral informed consent was obtained. Consent was obtained by Dr. Nelson Chimes. Patient was positioned prone on the fluoroscopy table. Local anesthesia was provided with 1% lidocaine without epinephrine after prepped and draped in the usual sterile fashion. Puncture  was performed at left L4-5 using a 3 1/2 inch 22-gauge spinal needle via left para median approach. Using a single pass through the dura, the needle was placed within the thecal sac, with return of clear CSF. 10 mL of Omnipaque-300 was injected into the thecal sac, with normal opacification of the nerve roots and cauda equina consistent with free flow within the subarachnoid space. The patient was then moved to the trendelenburg position and contrast flowed into the Thoracic spine region. I personally performed the lumbar puncture and administered the intrathecal contrast. I also personally performed acquisition of the myelogram images. TECHNIQUE: Contiguous axial images were obtained through the Thoracic spine after the intrathecal infusion of infusion. Coronal and sagittal  reconstructions were obtained of the axial image sets. FINDINGS: THORACIC MYELOGRAM FINDINGS: Limited filming due to limited upper thoracic opacity. Patient has prominent spondylosis at the thoracolumbar junction region with old partial compression fractures and prominent anterior extradural defects. See results of the CT scan below. CT THORACIC MYELOGRAM FINDINGS: The patient was scanned initially supine and then in the decubitus position to get better contrast opacity in the upper thoracic region in this kyphotic individual. As previously seen, there is tumor involvement of the right side of the T1, T2 and T3 vertebral bodies due to direct invasion from the upper medial chest lesion. Tumor involves the intervertebral foramen on the right at T1-2 and T2-3. The T3-4 foramen is not compromised. There is a small amount of ventral epidural tumor at the T1 and T2 level, but this does not obliterate the subarachnoid space or have any compressive effect upon the cord at this moment. Aside from the vertebral body, there is involvement of the transverse process and proximal rib at T1 and the proximal rib at T2. No tumor or compressive canal stenosis is seen below that region in the thoracic spine. The patient has pronounced degenerative spondylosis in the lower thoracic and upper lumbar region with indentation of the thecal sac. IMPRESSION: Direct tumor invasion of the right side of the T1, T2 and T3 vertebral bodies. Foraminal involvement on the right at T1-2 and T2-3. Small amount of ventral epidural tumor at T1 and T2 indents the thecal sac mildly but does not result in a block or cord compression. Electronically Signed   By: Nelson Chimes M.D.   On: 07/02/2016 16:45   Dg Myelography Lumbar Inj Thoracic  07/02/2016  CLINICAL DATA:  Metastatic disease to the upper thoracic spine. Assess for canal compromise FLUOROSCOPY TIME:  The 1 minutes 0 seconds. 578.84 micro gray meter squared PROCEDURE: LUMBAR PUNCTURE FOR  THORACIC MYELOGRAM After thorough discussion of risks and benefits of the procedure including bleeding, infection, injury to nerves, blood vessels, adjacent structures as well as headache and CSF leak, written and oral informed consent was obtained. Consent was obtained by Dr. Nelson Chimes. Patient was positioned prone on the fluoroscopy table. Local anesthesia was provided with 1% lidocaine without epinephrine after prepped and draped in the usual sterile fashion. Puncture was performed at left L4-5 using a 3 1/2 inch 22-gauge spinal needle via left para median approach. Using a single pass through the dura, the needle was placed within the thecal sac, with return of clear CSF. 10 mL of Omnipaque-300 was injected into the thecal sac, with normal opacification of the nerve roots and cauda equina consistent with free flow within the subarachnoid space. The patient was then moved to the trendelenburg position and contrast flowed into the Thoracic spine region. I personally performed  the lumbar puncture and administered the intrathecal contrast. I also personally performed acquisition of the myelogram images. TECHNIQUE: Contiguous axial images were obtained through the Thoracic spine after the intrathecal infusion of infusion. Coronal and sagittal reconstructions were obtained of the axial image sets. FINDINGS: THORACIC MYELOGRAM FINDINGS: Limited filming due to limited upper thoracic opacity. Patient has prominent spondylosis at the thoracolumbar junction region with old partial compression fractures and prominent anterior extradural defects. See results of the CT scan below. CT THORACIC MYELOGRAM FINDINGS: The patient was scanned initially supine and then in the decubitus position to get better contrast opacity in the upper thoracic region in this kyphotic individual. As previously seen, there is tumor involvement of the right side of the T1, T2 and T3 vertebral bodies due to direct invasion from the upper medial  chest lesion. Tumor involves the intervertebral foramen on the right at T1-2 and T2-3. The T3-4 foramen is not compromised. There is a small amount of ventral epidural tumor at the T1 and T2 level, but this does not obliterate the subarachnoid space or have any compressive effect upon the cord at this moment. Aside from the vertebral body, there is involvement of the transverse process and proximal rib at T1 and the proximal rib at T2. No tumor or compressive canal stenosis is seen below that region in the thoracic spine. The patient has pronounced degenerative spondylosis in the lower thoracic and upper lumbar region with indentation of the thecal sac. IMPRESSION: Direct tumor invasion of the right side of the T1, T2 and T3 vertebral bodies. Foraminal involvement on the right at T1-2 and T2-3. Small amount of ventral epidural tumor at T1 and T2 indents the thecal sac mildly but does not result in a block or cord compression. Electronically Signed   By: Nelson Chimes M.D.   On: 07/02/2016 16:45    Medications:   . amiodarone  200 mg Oral BID  . aspirin EC  81 mg Oral Daily  . atorvastatin  80 mg Oral Daily  . dexamethasone  4 mg Intravenous Q8H  . feeding supplement (ENSURE ENLIVE)  237 mL Oral BID BM  . gabapentin  300 mg Oral QHS  . Gerhardt's butt cream   Topical TID  . heparin subcutaneous  5,000 Units Subcutaneous Q8H  . insulin aspart  0-15 Units Subcutaneous Q4H  . metoprolol tartrate  75 mg Oral BID  . pantoprazole  40 mg Oral Daily  . potassium chloride  20 mEq Oral Daily  . sodium chloride flush  10-40 mL Intracatheter Q12H  . sodium chloride flush  3 mL Intravenous Q12H  . sucralfate  1 g Oral TID WC & HS  . tamsulosin  0.4 mg Oral QPC supper   Continuous Infusions:      LOS: 3 days   Velvet Bathe MD [facility] Pager [phone]  *Please refer to [email or web address], password [email or web address] to get updated schedule on who will round on this patient, as hospitalists switch teams weekly. If  7PM-7AM, please contact night-coverage at [email or web address], password [email or web address] for any overnight needs.  07/04/2016, 12:31 PM

## 2016-07-04 NOTE — Progress Notes (Addendum)
Inpatient. Received in clinic for PUT following xrt. Accompanied by son. Vitals stable. Reports pain at brachial plexus has resolved. Reports new onset pain in right posterior scapula. Reports productive cough with tan sputum. Denies hemoptysis. Audible congestion noted. Reports difficulty swallowing only meat, bread and water. Patient any throat irritation. No skin changes noted within treatment field. Reports shortness of breath with exertion. Reports fatigue. However, reports he was able to push up from his bed today and ambulate where he couldn't Monday. Patient noted to be taking decadron 4 mg tid.  BP 124/67 mmHg  Pulse 66  Resp 16  SpO2 100% Wt Readings from Last 3 Encounters:  07/04/16 212 lb 15.4 oz (96.6 kg)  06/28/16 210 lb 8 oz (95.482 kg)  06/27/16 206 lb 4.8 oz (93.577 kg)

## 2016-07-04 NOTE — NC FL2 (Signed)
[place] LEVEL OF CARE SCREENING TOOL     IDENTIFICATION  Patient Name: George Pollard Birthdate: 1933/10/15 Sex: male Admission Date (Current Location): 07/01/2016  [facility] and [place] Number:  [employer]er[employer] manufacturing systems and Address:  [facility],  [place] [address], [place]      Provider Number: [id number]  Attending Physician Name and Address:  Velvet Bathe, MD  Relative Name and Phone Number:       Current Level of Care: Hospital Recommended Level of Care: [place] Prior Approval Number:    Date Approved/Denied:   PASRR Number: [id number]  Discharge Plan: Home    Current Diagnoses: Patient Active Problem List   Diagnosis Date Noted  . Antineoplastic chemotherapy induced pancytopenia ([place]) 07/02/2016  . Diabetic neuropathy ([place]) 07/02/2016  . Acute kidney injury ([place]) 07/02/2016  . Weakness 07/01/2016  . Edema 07/01/2016  . Paraspinal mass 07/01/2016  . Urinary retention 07/01/2016  . Hypotension 07/01/2016  . Pressure ulcer stage III ([place]) 06/13/2016  . Pancoast tumor of right lung ([place]) 05/17/2016  . Chronic atrial fibrillation ([place]) 03/01/2016  . Ventricular tachycardia ([place]) 03/01/2016  . Cardiomyopathy, ischemic 10/20/2012  . CAD (coronary artery disease) of artery bypass graft 10/20/2012    Orientation RESPIRATION BLADDER Height & Weight     Self, Time, Situation, Place  Normal Incontinent Weight: 212 lb 15.4 oz (96.6 kg) Height:  '5\' 10"'$  (177.8 cm)  BEHAVIORAL SYMPTOMS/MOOD NEUROLOGICAL BOWEL NUTRITION STATUS      Continent Diet (Regular)  AMBULATORY STATUS COMMUNICATION OF NEEDS Skin   Extensive Assist Verbally PressureUlcer07/17/17StageII-Partialthicknesslossofdermispresentingasashallowopenulcerwithared,pinkwoundbedwithoutslough.3cmx2cm (right buttock)   PressureUlcer07/17/17StageII-Partialthicknesslossofdermispresentingasashallowopenulcerwithared,pinkwoundbedwithoutslough.2cmx1.5cm (left buttock)  PressureUlcer07/17/17StageII-Partialthicknesslossofdermispresentingasashallowopenulcerwithared,pinkwoundbedwithoutslough.4cmx2cm (left lower buttock)               Personal Care Assistance Level of Assistance  Bathing, Dressing Bathing Assistance: Limited assistance   Dressing Assistance: Limited assistance     Functional Limitations Info             SPECIAL CARE FACTORS FREQUENCY  PT (By licensed PT), OT (By licensed OT) (Radiation - final treatment 7/31)     PT Frequency: 5 OT Frequency: 5            Contractures      Additional Factors Info  Code Status, Allergies Code Status Info: DNR Allergies Info: NKDA           Current Medications (07/04/2016):  This is the current hospital active medication list Current Facility-Administered Medications  Medication Dose Route Frequency Provider Last Rate Last Dose  . acetaminophen (TYLENOL) tablet 650 mg  650 mg Oral Q6H PRN Jani Gravel, MD       Or  . acetaminophen (TYLENOL) suppository 650 mg  650 mg Rectal Q6H PRN Jani Gravel, MD      . amiodarone (PACERONE) tablet 200 mg  200 mg Oral BID Jani Gravel, MD   200 mg at 07/04/16 0859  . antiseptic oral rinse (BIOTENE) solution 15 mL  15 mL Mouth Rinse PRN Jani Gravel, MD      . aspirin EC tablet 81 mg  81 mg Oral Daily Jani Gravel, MD   81 mg at 07/04/16 0859  . atorvastatin (LIPITOR) tablet 80 mg  80 mg Oral Daily Jani Gravel, MD   80 mg at 07/04/16 0859  . dexamethasone (DECADRON) injection 4 mg  4 mg Intravenous Q8H Velvet Bathe, MD   4 mg at 07/04/16 1245  . feeding supplement (ENSURE ENLIVE) (ENSURE ENLIVE) liquid 237 mL  237 mL  Oral BID BM Venetia Maxon Rama, MD   237 mL at 07/04/16 1400  . gabapentin (NEURONTIN) capsule 300 mg  300 mg Oral QHS Jani Gravel, MD   300 mg at 07/03/16  2206  . Gerhardt's butt cream   Topical TID Venetia Maxon Rama, MD      . guaiFENesin-dextromethorphan (ROBITUSSIN DM) 100-10 MG/5ML syrup 5 mL  5 mL Oral Q4H PRN Silver Huguenin Elgergawy, MD   5 mL at 07/04/16 1058  . heparin injection 5,000 Units  5,000 Units Subcutaneous Q8H Albertine Patricia, MD   5,000 Units at 07/04/16 1254  . HYDROcodone-acetaminophen (NORCO) 10-325 MG per tablet 1 tablet  1 tablet Oral Q4H PRN Jani Gravel, MD      . insulin aspart (novoLOG) injection 0-15 Units  0-15 Units Subcutaneous Q4H Venetia Maxon Rama, MD   8 Units at 07/04/16 1245  . metoprolol tartrate (LOPRESSOR) tablet 75 mg  75 mg Oral BID Jani Gravel, MD   75 mg at 07/04/16 0859  . nitroGLYCERIN (NITROSTAT) SL tablet 0.4 mg  0.4 mg Sublingual Q5 min PRN Jani Gravel, MD      . ondansetron [facility]) injection 4 mg  4 mg Intravenous Q6H PRN Nelson Chimes, MD      . ondansetron [facility]) tablet 8 mg  8 mg Oral Q8H PRN Jani Gravel, MD      . pantoprazole (PROTONIX) EC tablet 40 mg  40 mg Oral Daily Albertine Patricia, MD   40 mg at 07/04/16 0858  . potassium chloride SA (K-DUR,KLOR-CON) CR tablet 20 mEq  20 mEq Oral Daily Jani Gravel, MD   20 mEq at 07/04/16 0858  . prochlorperazine (COMPAZINE) tablet 10 mg  10 mg Oral Q6H PRN Jani Gravel, MD      . sodium chloride flush (NS) 0.9 % injection 10-40 mL  10-40 mL Intracatheter Q12H Venetia Maxon Rama, MD   10 mL at 07/03/16 0948  . sodium chloride flush (NS) 0.9 % injection 10-40 mL  10-40 mL Intracatheter PRN Christina P Rama, MD      . sodium chloride flush (NS) 0.9 % injection 3 mL  3 mL Intravenous Q12H Jani Gravel, MD   3 mL at 07/02/16 0045  . sucralfate (CARAFATE) tablet 1 g  1 g Oral TID WC & HS Jani Gravel, MD   1 g at 07/04/16 1245  . tamsulosin (FLOMAX) capsule 0.4 mg  0.4 mg Oral QPC supper Albertine Patricia, MD   0.4 mg at 07/03/16 1656     Discharge Medications: Please see discharge summary for a list of discharge medications.  Relevant Imaging Results:  Relevant Lab  Results:   Additional Information SSN: [id number]  Standley Brooking, LCSW

## 2016-07-04 NOTE — Clinical Social Work Placement (Signed)
   CLINICAL SOCIAL WORK PLACEMENT  NOTE  Date:  07/04/2016  Patient Details  Name: George Pollard MRN: [id number] Date of Birth: 1933-09-16  Clinical Social Work is seeking post-discharge placement for this patient at the [place] level of care (*CSW will initial, date and re-position this form in  chart as items are completed):  Yes   Patient/family provided with [place] Work Department's list of facilities offering this level of care within the geographic area requested by the patient (or if unable, by the patient's family).  Yes   Patient/family informed of their freedom to choose among providers that offer the needed level of care, that participate in Medicare, Medicaid or managed care program needed by the patient, have an available bed and are willing to accept the patient.  Yes   Patient/family informed of [facility]'s ownership interest in [facility] and [facility], as well as of the fact that they are under no obligation to receive care at these facilities.  PASRR submitted to EDS on 07/04/16     PASRR number received on 07/04/16     Existing PASRR number confirmed on       FL2 transmitted to all facilities in geographic area requested by pt/family on 07/04/16     FL2 transmitted to all facilities within larger geographic area on       Patient informed that his/her managed care company has contracts with or will negotiate with certain facilities, including the following:            Patient/family informed of bed offers received.  Patient chooses bed at       Physician recommends and patient chooses bed at      Patient to be transferred to   on  .  Patient to be transferred to facility by       Patient family notified on   of transfer.  Name of family member notified:        PHYSICIAN       Additional Comment:    _______________________________________________ Standley Brooking, LCSW 07/04/2016, 2:46 PM

## 2016-07-05 ENCOUNTER — Encounter: Payer: Self-pay | Admitting: Radiation Oncology

## 2016-07-05 ENCOUNTER — Ambulatory Visit
Admission: RE | Admit: 2016-07-05 | Discharge: 2016-07-05 | Disposition: A | Discharge status: Home / Self Care | Payer: Medicare Other | Source: Ambulatory Visit | Attending: Radiation Oncology | Admitting: Radiation Oncology

## 2016-07-05 LAB — GLUCOSE, CAPILLARY
GLUCOSE-CAPILLARY: 206 mg/dL — AB (ref 65–99)
GLUCOSE-CAPILLARY: 212 mg/dL — AB (ref 65–99)
GLUCOSE-CAPILLARY: 227 mg/dL — AB (ref 65–99)
GLUCOSE-CAPILLARY: 243 mg/dL — AB (ref 65–99)
GLUCOSE-CAPILLARY: 274 mg/dL — AB (ref 65–99)
GLUCOSE-CAPILLARY: 275 mg/dL — AB (ref 65–99)

## 2016-07-05 NOTE — Progress Notes (Signed)
[facility] Therapy Treatment Record Phone [phone]   Radiation Therapy was administered to [name] on: 07/05/2016  3:57 PM and was treatment # 27 out of a planned course of 33 treatments.  Radiation Treatment  1). Beam photons with 6-10 energy  2). Brachytherapy None  3). Stereotactic Radiosurgery None  4). Other Radiation None     Amazing Cowman J, Rad Therap

## 2016-07-05 NOTE — Care Management Note (Signed)
Case Management Note  Patient Details  Name: George Pollard MRN: [id number] Date of Birth: 1933-09-02  Subjective/Objective:  PT-recc SNF. Patient declines SNF, wants home w/HHC-spouse chose [facility]-states was active w/[facility]. Recommend HHRN/PT/OT/aide/sw. Spouse would like hospital bed-[facility] rep George Pollard, & dme rep-George Pollard made aware of possible d/c in am. Await HHC,dme orders.MD paged.May need PTAR @ d/c-family still deciding.Spouse George Pollard.                 Action/Plan:d/c plan home w/HHC.   Expected Discharge Date:                  Expected Discharge Plan:  [place]  In-House Referral:  Clinical Social Work  Discharge planning Services  CM Consult  Post Acute Care Choice:    Choice offered to:  Spouse  DME Arranged:    DME Agency:     HH Arranged:    [place] Agency:     Status of Service:  In process, will continue to follow  If discussed at Long Length of Stay Meetings, dates discussed:    Additional Comments:  George Phi, RN 07/05/2016, 11:11 AM

## 2016-07-05 NOTE — Progress Notes (Signed)
Pt reported to RN and NT that he had not voided since early am.  Bladder scan complete and resulted 942cc. Dr. Wendee Beavers notified and order received to I&O cath. I&O resulted in 1050cc George Pollard colored urine.  Stacey Drain

## 2016-07-05 NOTE — Progress Notes (Signed)
PT Cancellation Note  Patient Details Name: George Pollard MRN: [id number] DOB: 1933/05/15   Cancelled Treatment:    Reason Eval/Treat Not Completed: Patient at procedure or test/unavailable (Radiation)   York Ram E 07/05/2016, 4:10 PM Carmelia Bake, PT, DPT 07/05/2016 Pager: [phone]

## 2016-07-05 NOTE — Progress Notes (Signed)
This encounter was created in error - please disregard.

## 2016-07-05 NOTE — Progress Notes (Signed)
Progress Note    George Pollard  NLG:[id number] DOB: 1933/04/21  DOA: 07/01/2016 PCP: Monico Blitz, MD    Brief Narrative:   George Pollard is an 82 y.o. male with a PMH of CAD status post CABG, chronic atrial fibrillation, squamous cell carcinoma of the lung diagnosed 5/17 status post radiation therapy, currently on weekly carboplatin and paclitaxel who was admitted 07/01/16 with a chief complaint of a one-week history of bilateral leg and right arm weakness. A CT scan done on admission showed a paraspinal mass. Patient unable to have MRI secondary to pacer/defibrillator in situ. Transferred from [facility] to [facility] for CT myelogram per neurosurgery recommendations, Which was significant for Direct tumor invasion of the right side of the T1, T2 and T3 vertebral bodies. But no block or cord compression.   Assessment/Plan:   Principal Problem:   Paraspinal mass with bilateral leg and right arm weakness in the setting of metastatic squamous cell carcinoma of the lung/Pancoast tumor Findings concerning for metastatic cancer. There is also evidence of adrenal gland metastasis on imaging. CT of the head negative for acute intracranial findings including mass. CT myelogram significant for Direct tumor invasion of the right side of the T1, T2 and T3 vertebral bodies. But no block or cord compression, will continue with radiation therapy.  - At [facility] getting radiation therapy. Patient reports improvement on this regimen.  Anti-neoplastic chemotherapy-induced pancytopenia Monitor blood counts closely. No current indication for transfusion. - Stable currently    Type 2 diabetes with diabetic neuropathy Metformin on hold secondary to renal insufficiency. Currently being managed with insulin moderate scale .Monitor glycemic control closely with initiation of steroid therapy. Continue Neurontin for diabetic neuropathy. Follow-up hemoglobin A1c is 7.8.    Chronic atrial fibrillation Continue amiodarone  and Coumadin per pharmacy. INR subtherapeutic at present.D/W with oncology Dr. Whitney Muse there is no contraindication for anticoagulation. Warfarin has been stopped as an outpatient for nasal bleed, then held secondary to nasal bleed. May restart the next 1-2 days.    Coronary artery disease with history of CABG Continue aspirin, metoprolol and statin.    Acute kidney injury/hypotension Baseline creatinine 1.09. Creatinine 1.89 on admission. Improving with gentle hydration. Continue to hold metformin, Lasix and lisinopril. Renal ultrasound negative for hydronephrosis.    Edema Patient has hypoalbuminemia.    Urinary retention On flomax will try voiding trial today.     Pressure ulcer, stage III  wound care nurse consulted.   Family Communication/Anticipated D/C date and plan/Code Status   DVT prophylaxis: Coumadin Is currently on hold, will start on subcutaneous heparin. Code Status: DNR, confirmed by wife Family Communication: Wife and daughters at the bedside. Disposition Plan: Undetermined. Will transfer [facility], for continuous need of radiation therapy, PT consult pending, but it likely will need SNF.  Discussed with radiation oncology Dr. Tammi Klippel via phone, discussed with patient primary oncologist Dr. Whitney Muse via phone Procedures:   CT myelogram 07/02/16   Anti-Infectives:   None.  Subjective:    George Pollard  Pt has no new complaints. No acute issues overnight.   Objective:    Filed Vitals:   07/04/16 1354 07/04/16 2143 07/05/16 0435 07/05/16 1439  BP: 116/69 110/63 99/64 124/69  Pulse: 70 86 90 83  Temp: 97.6 F (36.4 C) 97.6 F (36.4 C) 97.9 F (36.6 C) 98.3 F (36.8 C)  TempSrc: Oral Oral Oral Oral  Resp: '18 18 20 20  '$ Height:      Weight:   98 kg (  216 lb 0.8 oz)   SpO2: 100% 97% 94% 96%    Intake/Output Summary (Last 24 hours) at 07/05/16 1526 Last data filed at 07/05/16 1500  Gross per 24 hour  Intake     10 ml  Output   1250 ml  Net  -1240 ml     Filed Weights   07/03/16 1548 07/04/16 0556 07/05/16 0435  Weight: 97.1 kg (214 lb 1.1 oz) 96.6 kg (212 lb 15.4 oz) 98 kg (216 lb 0.8 oz)    Exam: General exam: Appears calm and comfortable. In NAD.  Respiratory system: Clear to auscultation. Respiratory effort normal. Cardiovascular system: HSIR. No JVD,  rubs, gallops or clicks. No murmurs. Gastrointestinal system: Abdomen is nondistended, soft and nontender. No organomegaly or masses felt. Normal bowel sounds heard. Central nervous system: Awake and alert answering questions, no facial asymmetry, moves extremities Extremities: Lower extremity venous stasis with 2+ pitting edema. Skin: Warm and dry. Psychiatry: Judgement and insight appear normal. Mood & affect appropriate.   Data Reviewed:   I have personally reviewed following labs and imaging studies:  Labs: Basic Metabolic Panel:  Recent Labs Lab 07/01/16 1250 07/02/16 0100 07/04/16 0435  NA 136 137 137  K 3.8 3.8 4.2  CL 103 103 104  CO2 '26 27 28  '$ GLUCOSE 180* 255* 200*  BUN 27* 20 27*  CREATININE 1.81* 1.56* 1.08  CALCIUM 8.4* 8.6* 8.9   GFR Estimated Creatinine Clearance: 64 mL/min (by C-G formula based on Cr of 1.08). Liver Function Tests:  Recent Labs Lab 07/01/16 1250 07/02/16 0100  AST 21 19  ALT 27 27  ALKPHOS 69 66  BILITOT 2.4* 2.0*  PROT 5.4* 5.2*  ALBUMIN 2.6* 2.4*   Coagulation profile  Recent Labs Lab 07/01/16 1250 07/02/16 0100 07/03/16 0430  INR 1.43 1.37 1.35    CBC:  Recent Labs Lab 07/01/16 1250 07/02/16 0100 07/04/16 0435  WBC 4.5 3.6* 4.0  NEUTROABS 3.3 3.3  --   HGB 8.9* 8.6* 8.6*  HCT 27.2* 27.1* 26.9*  MCV 90.1 90.9 91.8  PLT 152 134* 116*   Cardiac Enzymes:  Recent Labs Lab 07/01/16 1250  TROPONINI <0.03   CBG:  Recent Labs Lab 07/04/16 2115 07/04/16 2359 07/05/16 0419 07/05/16 0822 07/05/16 1153  GLUCAP 244* 274* 275* 206* 212*   Microbiology Recent Results (from the past 240 hour(s))   MRSA PCR Screening     Status: None   Collection Time: 07/01/16 10:41 PM  Result Value Ref Range Status   MRSA by PCR NEGATIVE NEGATIVE Final    Comment:        The GeneXpert MRSA Assay (FDA approved for NASAL specimens only), is one component of a comprehensive MRSA colonization surveillance program. It is not intended to diagnose MRSA infection nor to guide or monitor treatment for MRSA infections.     Radiology: No results found.  Medications:   . amiodarone  200 mg Oral BID  . aspirin EC  81 mg Oral Daily  . atorvastatin  80 mg Oral Daily  . dexamethasone  4 mg Intravenous Q8H  . feeding supplement (ENSURE ENLIVE)  237 mL Oral BID BM  . gabapentin  300 mg Oral QHS  . Gerhardt's butt cream   Topical TID  . heparin subcutaneous  5,000 Units Subcutaneous Q8H  . insulin aspart  0-15 Units Subcutaneous Q4H  . metoprolol tartrate  75 mg Oral BID  . pantoprazole  40 mg Oral Daily  . potassium chloride  20 mEq  Oral Daily  . sodium chloride flush  10-40 mL Intracatheter Q12H  . sodium chloride flush  3 mL Intravenous Q12H  . sucralfate  1 g Oral TID WC & HS  . tamsulosin  0.4 mg Oral QPC supper   Continuous Infusions:      LOS: 4 days   Velvet Bathe MD [facility] Pager [phone]  *Please refer to [email or web address], password [email or web address] to get updated schedule on who will round on this patient, as hospitalists switch teams weekly. If 7PM-7AM, please contact night-coverage at [email or web address], password [email or web address] for any overnight needs.  07/05/2016, 3:26 PM

## 2016-07-05 NOTE — Progress Notes (Signed)
CSW met with patient, wife & daughter-in-law at bedside re: discharge planning. Patient & wife informed CSW that they have decided for him to return home at discharge, rather than go to SNF. RNCM, Juliann Pulse made aware.   No further CSW needs identified - CSW signing off.   Raynaldo Opitz, [facility] Clinical Social Worker cell #: [phone]

## 2016-07-06 MED ORDER — TAMSULOSIN HCL 0.4 MG PO CAPS: 0.4000 mg | ORAL_CAPSULE | Freq: Every day | ORAL | Status: DC

## 2016-07-06 NOTE — Progress Notes (Signed)
Occupational Therapy Treatment Patient Details Name: George Pollard MRN: [id number] DOB: [birth year] Today's Date: 07/06/2016    History of present illness 82 yo male admitted with paraspinal mass with bilateral leg and right arm weakness  CT myelogram significant for Direct tumor invasion of the right side of the T1, T2 and T3 vertebral bodies. Hx of CAD s/p CABG, Chronic afib , Lung mass (squamous cell),    OT comments  Pt and wife have decided to d/c home rather than SNF so focus of session on family/caregiver education on functional transfers. Educated on use of gait belt or belt for more safety/security with transfers. Feel pt could benefit from SNF but recommend [facility] as pt is going to d/c home.   Follow Up Recommendations  Supervision/Assistance - 24 hour;SNF (pt/family have chosen to d/c home so recommend HHOT)    Equipment Recommendations  None recommended by OT    Recommendations for Other Services      Precautions / Restrictions Precautions Precautions: Fall Precaution Comments: fell prior to admission per chart Restrictions Weight Bearing Restrictions: No       Mobility Bed Mobility Overal bed mobility: Needs Assistance Bed Mobility: Rolling;Sidelying to Sit Rolling: Min assist Sidelying to sit: Mod assist;+2 for physical assistance;+2 for safety/equipment       General bed mobility comments: assist for LEs off the bed and trunk to upright.  Transfers Overall transfer level: Needs assistance Equipment used: Rolling walker (2 wheeled) Transfers: Sit to/from Stand Sit to Stand: Min assist;From elevated surface         General transfer comment: cues for hand placement and min assist to rise and steady. Pt tending to want to pull up on RW    Balance             Standing balance-Leahy Scale: Poor                     ADL                           Toilet Transfer: Moderate assistance;Stand-pivot (wheelchair)              General ADL Comments: Pt to d/c today. Pt and wife have decided to d/c home rather than SNF. Provided family /caregiver education on safety with functional transfers and ADL. Pt able to stand and use walker to take a few steps away from EOB and then back to EOB with overall mod assist. Pt then sat EOB for a few minutes and then pivoted to w/c in prep for d/c. Pt needs constant cues for safety as he tends to pull up using walker, doesnt back up fully to EOB before sitting and tends to keep feet too close together. Educated wife on using a belt or gait belt for additional safty with assisting with functional transfers. They would like a wider 3in1 but have a standard size one. Discussed wide 3in1 and coverage and where to obtain. They are also interested in a tubbench but will wait for HHOT to asses pt for bench. Pt with weeping area on L elbow-informed nursing. Pt also have hospital bed delivered once home so educated on raising bed height to help with sit to stand but pt needs cues to scoot to EOB fully before attempting to stand.       Vision  Perception     Praxis      Cognition   Behavior During Therapy: WFL for tasks assessed/performed Overall Cognitive Status: Within Functional Limits for tasks assessed                       Extremity/Trunk Assessment               Exercises     Shoulder Instructions       General Comments      Pertinent Vitals/ Pain       Pain Assessment: Faces Pain Score: 4  Pain Location: sacral area Pain Descriptors / Indicators: Grimacing;Guarding Pain Intervention(s): Monitored during session  Home Living                                          Prior Functioning/Environment              Frequency Min 2X/week     Progress Toward Goals  OT Goals(current goals can now be found in the care plan section)  Progress towards OT goals: Progressing toward goals     Plan Discharge plan  needs to be updated    Co-evaluation                 End of Session Equipment Utilized During Treatment: Rolling walker   Activity Tolerance Patient tolerated treatment well   Patient Left with family/visitor present (in wheelchair.)   Nurse Communication          Time: 7902-4097 OT Time Calculation (min): 20 min  Charges: OT General Charges $OT Visit: 1 Procedure OT Treatments $Therapeutic Activity: 8-22 mins  Jules Schick 07/06/2016, 12:58 PM

## 2016-07-06 NOTE — Discharge Summary (Signed)
Physician Discharge Summary  George Pollard RKY:[id number] DOB: [birth year] DOA: 07/01/2016  PCP: Monico Blitz, MD  Admit date: 07/01/2016 Discharge date: 07/06/2016  Time spent: > 35 minutes  Recommendations for Outpatient Follow-up:  1. Ensure patient follows up with urologist for voiding trial and evaluation of urinary retention 2. Ensure patient follows up with radiation oncologist for further evaluation recommendations   Discharge Diagnoses:  Principal Problem:   Paraspinal mass Active Problems:   CAD (coronary artery disease) of artery bypass graft   Chronic atrial fibrillation (HCC)   Pancoast tumor of right lung (HCC)   Pressure ulcer stage III (HCC)   Weakness   Edema   Urinary retention   Hypotension   Antineoplastic chemotherapy induced pancytopenia (HCC)   Diabetic neuropathy ([place])   Acute kidney injury ([place])   Discharge Condition: stable  Diet recommendation: Regular diet  Filed Weights   07/04/16 0556 07/05/16 0435 07/06/16 0436  Weight: 96.6 kg (212 lb 15.4 oz) 98 kg (216 lb 0.8 oz) 95.4 kg (210 lb 5.1 oz)    History of present illness:  George Pollard is an 82 y.o. male with a PMH of CAD status post CABG, chronic atrial fibrillation, squamous cell carcinoma of the lung diagnosed 5/17 status post radiation therapy, currently on weekly carboplatin and paclitaxel who was admitted 07/01/16 with a chief complaint of a one-week history of bilateral leg and right arm weakness. A CT scan done on admission showed a paraspinal mass. Patient unable to have MRI secondary to pacer/defibrillator in situ. Transferred from [facility] to [facility] for CT myelogram per neurosurgery recommendations, Which was significant for Direct tumor invasion of the right side of the T1, T2 and T3 vertebral bodies. But no block or cord compression.  Hospital Course:  Principal Problem:  Paraspinal mass with bilateral leg and right arm weakness in the setting of metastatic squamous cell carcinoma of  the lung/Pancoast tumor Findings concerning for metastatic cancer. There is also evidence of adrenal gland metastasis on imaging. CT of the head negative for acute intracranial findings including mass. CT myelogram significant for Direct tumor invasion of the right side of the T1, T2 and T3 vertebral bodies. But no block or cord compression, will continue with radiation therapy.  - At [facility] getting radiation therapy. Patient reports that oncologist was okay with him going home over the weekend and returning on Monday. Tried to contact radiation oncologist but was not able to, based on last note they were considering giving the patient a break and having him come back for further radiation therapy. Patient reports improvement in lower extremity weakness after radiation therapy would like to go home.  Anti-neoplastic chemotherapy-induced pancytopenia Monitor blood counts closely. No current indication for transfusion. - Stable currently   Type 2 diabetes with diabetic neuropathy Metformin on discharge given creatinine of 1.0. Follow-up hemoglobin A1c is 7.8.   Chronic atrial fibrillation Continue amiodarone and Coumadin per pharmacy. INR subtherapeutic at present.D/W with oncology Dr. Whitney Muse there is no contraindication for anticoagulation. Warfarin has been stopped as an outpatient for nasal bleed will continue on discharge   Coronary artery disease with history of CABG Continue aspirin, metoprolol and statin.   Acute kidney injury/hypotension Currently back at baseline we'll continue home medication regimen   Edema Patient has hypoalbuminemia.   Urinary retention On flomax, failed voiding trial. I have recommended they follow up with a urologist after hospital discharge. They verbalize agreement and understanding.    Pressure ulcer, stage III wound care nurse consulted.  Procedures:  None  Consultations:  Radiation oncologist  Discharge Exam: Filed Vitals:   07/05/16  2039 07/06/16 0436  BP: 110/58 116/59  Pulse: 73 67  Temp: 98.4 F (36.9 C) 97.9 F (36.6 C)  Resp: 18 16    General: Pt in nad, alert and awake Cardiovascular: s1 and s2 present, no rubs Respiratory: no increased wob, no wheezes  Discharge Instructions   Discharge Instructions    Call MD for:  extreme fatigue    Complete by:  As directed      Call MD for:  temperature >100.4    Complete by:  As directed      Diet - low sodium heart healthy    Complete by:  As directed      Discharge instructions    Complete by:  As directed   Please be sure to follow up with your radiation oncologist and a Urologist after hospital discharge.     Increase activity slowly    Complete by:  As directed           Current Discharge Medication List    START taking these medications   Details  tamsulosin (FLOMAX) 0.4 MG CAPS capsule Take 1 capsule (0.4 mg total) by mouth daily after supper. Qty: 30 capsule, Refills: 0      CONTINUE these medications which have NOT CHANGED   Details  amiodarone (PACERONE) 200 MG tablet Take 1 tablet by mouth 2 (two) times daily. Refills: 0    aspirin EC 81 MG tablet Take 81 mg by mouth daily.    atorvastatin (LIPITOR) 80 MG tablet take 1 tablet by mouth once daily Qty: 30 tablet, Refills: 2    fentaNYL (DURAGESIC - DOSED MCG/HR) 50 MCG/HR Place 1 patch (50 mcg total) onto the skin every 3 (three) days. Qty: 10 patch, Refills: 0   Associated Diagnoses: Pancoast tumor of right lung ([place]); Neuropathy associated with malignant neoplasm (HCC)    furosemide (LASIX) 40 MG tablet Take 1 tablet (40 mg total) by mouth 2 (two) times daily. Qty: 180 tablet, Refills: 3   Associated Diagnoses: Cardiomyopathy, ischemic    gabapentin (NEURONTIN) 300 MG capsule Take 1 capsule (300 mg total) by mouth at bedtime. Qty: 60 capsule, Refills: 1   Associated Diagnoses: Neuropathy associated with malignant neoplasm (HCC)    Heparin Lock Flush (HEPARIN FLUSH, PORCINE,) 100  UNIT/ML injection Flush PICC line twice a week. First flush with Saline. Then Flush PICC line with 2.63m of Heparin. Qty: 16 Syringe, Refills: 0   Associated Diagnoses: [place]ast tumor of right lung [place]; Lung mass; PICC (peripherally inserted central catheter) flush    HYDROcodone-acetaminophen (NORCO) 10-325 MG tablet Take 1 tablet by mouth every 4 (four) hours as needed. Qty: 90 tablet, Refills: 0   Associated Diagnoses: Lung mass    lisinopril (PRINIVIL,ZESTRIL) 2.5 MG tablet take 1 tablet by mouth once daily Qty: 30 tablet, Refills: 2    metFORMIN (GLUCOPHAGE) 500 MG tablet Take 500 mg by mouth daily.  Refills: 0    Metoprolol Tartrate (LOPRESSOR) 50 MG tablet Take 1.5 tablets (75 mg total) by mouth 2 (two) times daily. Qty: 90 tablet, Refills: 6    nitroGLYCERIN (NITROSTAT) 0.4 MG SL tablet Place 1 tablet (0.4 mg total) under the tongue every 5 (five) minutes as needed for chest pain (up to 3 doses). Qty: 25 tablet, Refills: 2    ondansetron (ZOFRAN) 8 MG tablet Take 1 tablet (8 mg total) by mouth every 8 (eight) hours as  needed for nausea or vomiting. Qty: 30 tablet, Refills: 2   Associated Diagnoses: Lung mass    Potassium Bicarb-Citric Acid 20 MEQ TBEF Take 1 tablet (20 mEq total) by mouth daily. Qty: 30 each, Refills: 3    prochlorperazine (COMPAZINE) 10 MG tablet Take 1 tablet (10 mg total) by mouth every 6 (six) hours as needed for nausea or vomiting. Qty: 30 tablet, Refills: 2   Associated Diagnoses: Lung mass    Sodium Chloride Flush (NORMAL SALINE FLUSH) 0.9 % SOLN Flush PICC line twice a week with 85m prior to flushing with Heparin. Qty: 16 Syringe, Refills: 0   Associated Diagnoses: P[place]ast tumor of right lung [place]; Lung mass; PICC (peripherally inserted central catheter) flush    sucralfate (CARAFATE) 1 g tablet Take 1 tablet (1 g total) by mouth 4 (four) times daily -  with meals and at bedtime. 5 min before meals for radiation induced esophagitis Qty: 120  tablet, Refills: 2   Associated Diagnoses: Pancoast tumor of right lung (HCC)    warfarin (COUMADIN) 5 MG tablet Take 1 tablet (5 mg total) by mouth daily at 6 PM. Qty: 30 tablet, Refills: 0    CARBOPLATIN IV Inject into the vein. To be given weekly with radiation   Associated Diagnoses: Pancoast tumor of right lung ([place]; Lung mass; PICC (peripherally inserted central catheter) flush    feeding supplement (BOOST HIGH PROTEIN) LIQD Take 1 Container by mouth 2 (two) times daily. Chocolate/Strawberry    PACLitaxel (TAXOL IV) Inject into the vein. To be given weekly with radiation   Associated Diagnoses: Pancoast tumor of right lung [place]; Lung mass; PICC (peripherally inserted central catheter) flush       No Known Allergies    The results of significant diagnostics from this hospitalization (including imaging, microbiology, ancillary and laboratory) are listed below for reference.    Significant Diagnostic Studies: Ct Abdomen Pelvis Wo Contrast  07/01/2016  CLINICAL DATA:  Lower extremity weakness. Cancer patient receiving chemotherapy and radiation therapy. EXAM: CT ABDOMEN WITHOUT CONTRAST CT PELVIS WITHOUt CONTRAST CT THORACIC SPINE CT LUMBAR SPINE TECHNIQUE: Multidetector CT imaging of the abdomen was performed using the standard protocol. Multidetector CT imaging of the pelvis was performed following the standard protocol without intravenous contrast. Using the CT data thoracic and lumbar spine examinations or degenerated. COMPARISON:  CT SCAN 04/29/2016 FINDINGS: Lower chest: Streaky bibasilar atelectasis, right greater than left. The heart is normal in size for age. No pericardial effusion. Pacer wires are noted. Coronary artery calcifications. Hepatobiliary: No focal hepatic lesions or intrahepatic biliary dilatation. Layering small gallstones nerve gallbladder. No common bile duct dilatation. Pancreas: No mass, inflammation or ductal dilatation. Spleen: Normal size.  No focal lesions.  Adrenals/Urinary Tract: Bilateral adrenal gland metastasis are again demonstrated. Renal cysts are noted but no worrisome renal lesions or obstructing ureteral calculi. Chronic right UPJ obstruction. The bladder is decompressed by Foley catheter. Stomach/Bowel: The stomach, duodenum, small bowel and colon are grossly normal. No inflammatory changes, mass lesions or obstructive findings. The terminal ileum is normal. The appendix is normal. Vascular/Lymphatic: Advanced atherosclerotic calcifications involving the aorta and branch vessels. No mesenteric or retroperitoneal mass or lymphadenopathy. Other: No pelvic mass or adenopathy. No pre pelvic fluid collections. No inguinal mass or adenopathy. Musculoskeletal: No significant bony findings. THORACIC SPINE: Large right apical lung mass invading the chest wall and directly involving the first right and second ribs which are destroyed. There is also tumor destroying the right aspect of T1, T2 and T3.  Pathologic compression fracture of T2. Mild canal compromise is also suspected but MR is recommended for further evaluation. A left upper lobe lung lesion is noted along with severe emphysematous changes. Advanced atherosclerotic calcifications involving the aorta but no aneurysm. Lumbar spine: The advanced degenerative lumbar spondylosis with multilevel disc disease and facet disease. T12 compression fracture is noted. No lumbar compression fractures. No obvious destructive bone metastasis. Multilevel disc disease and facet disease. IMPRESSION: 1. Large right apical/paraspinal mass directly invading the T1, T2 and T3 vertebral bodies on the right side and also destroying the right first and second ribs. There is canal encroachment at T2 but further evaluation with thoracic spine MRI without and with contrast suggested. 2. Bibasilar atelectasis, lung lesions and severe emphysema. 3. Bilateral adrenal gland metastasis. 4. Advanced atherosclerotic calcifications involving  the aorta iliac arteries. 5. Remote compression fracture of T12. 6. No acute abdominal/pelvic findings. Electronically Signed   By: Marijo Sanes M.D.   On: 07/01/2016 16:08   Ct Head Wo Contrast  07/01/2016  CLINICAL DATA:  Lower extremity weakness. Cancer patient receiving chemotherapy and radiation therapy. History of lung cancer. EXAM: CT HEAD WITHOUT CONTRAST TECHNIQUE: Contiguous axial images were obtained from the base of the skull through the vertex without intravenous contrast. COMPARISON:  None. FINDINGS: Brain: There is mild generalized age related parenchymal atrophy with commensurate dilatation of the ventricles and sulci. Mild chronic small vessel ischemic changes noted in the bilateral periventricular white matter regions. There is no mass, hemorrhage, edema or other evidence of acute parenchymal abnormality. No extra-axial hemorrhage. Vascular: No hyperdense vessel or unexpected calcification. There are chronic calcified atherosclerotic changes of the large vessels at the skull base. Skull: Negative for fracture or focal lesion. Sinuses/Orbits: No acute findings. Other: None. IMPRESSION: 1. No acute intracranial findings. No intracranial mass, hemorrhage or edema. 2. Chronic ischemic changes in the white matter. Electronically Signed   By: Franki Cabot M.D.   On: 07/01/2016 19:21   Ct Thoracic Spine Wo Contrast  07/02/2016  CLINICAL DATA:  Metastatic disease to the upper thoracic spine. Assess for canal compromise FLUOROSCOPY TIME:  The 1 minutes 0 seconds. 578.84 micro gray meter squared PROCEDURE: LUMBAR PUNCTURE FOR THORACIC MYELOGRAM After thorough discussion of risks and benefits of the procedure including bleeding, infection, injury to nerves, blood vessels, adjacent structures as well as headache and CSF leak, written and oral informed consent was obtained. Consent was obtained by Dr. Nelson Chimes. Patient was positioned prone on the fluoroscopy table. Local anesthesia was provided with  1% lidocaine without epinephrine after prepped and draped in the usual sterile fashion. Puncture was performed at left L4-5 using a 3 1/2 inch 22-gauge spinal needle via left para median approach. Using a single pass through the dura, the needle was placed within the thecal sac, with return of clear CSF. 10 mL of Omnipaque-300 was injected into the thecal sac, with normal opacification of the nerve roots and cauda equina consistent with free flow within the subarachnoid space. The patient was then moved to the trendelenburg position and contrast flowed into the Thoracic spine region. I personally performed the lumbar puncture and administered the intrathecal contrast. I also personally performed acquisition of the myelogram images. TECHNIQUE: Contiguous axial images were obtained through the Thoracic spine after the intrathecal infusion of infusion. Coronal and sagittal reconstructions were obtained of the axial image sets. FINDINGS: THORACIC MYELOGRAM FINDINGS: Limited filming due to limited upper thoracic opacity. Patient has prominent spondylosis at the thoracolumbar junction region with  old partial compression fractures and prominent anterior extradural defects. See results of the CT scan below. CT THORACIC MYELOGRAM FINDINGS: The patient was scanned initially supine and then in the decubitus position to get better contrast opacity in the upper thoracic region in this kyphotic individual. As previously seen, there is tumor involvement of the right side of the T1, T2 and T3 vertebral bodies due to direct invasion from the upper medial chest lesion. Tumor involves the intervertebral foramen on the right at T1-2 and T2-3. The T3-4 foramen is not compromised. There is a small amount of ventral epidural tumor at the T1 and T2 level, but this does not obliterate the subarachnoid space or have any compressive effect upon the cord at this moment. Aside from the vertebral body, there is involvement of the transverse  process and proximal rib at T1 and the proximal rib at T2. No tumor or compressive canal stenosis is seen below that region in the thoracic spine. The patient has pronounced degenerative spondylosis in the lower thoracic and upper lumbar region with indentation of the thecal sac. IMPRESSION: Direct tumor invasion of the right side of the T1, T2 and T3 vertebral bodies. Foraminal involvement on the right at T1-2 and T2-3. Small amount of ventral epidural tumor at T1 and T2 indents the thecal sac mildly but does not result in a block or cord compression. Electronically Signed   By: Nelson Chimes M.D.   On: 07/02/2016 16:45   Ct Thoracic Spine Wo Contrast  07/01/2016  CLINICAL DATA:  Lower extremity weakness. Cancer patient receiving chemotherapy and radiation therapy. EXAM: CT ABDOMEN WITHOUT CONTRAST CT PELVIS WITHOUt CONTRAST CT THORACIC SPINE CT LUMBAR SPINE TECHNIQUE: Multidetector CT imaging of the abdomen was performed using the standard protocol. Multidetector CT imaging of the pelvis was performed following the standard protocol without intravenous contrast. Using the CT data thoracic and lumbar spine examinations or degenerated. COMPARISON:  CT SCAN 04/29/2016 FINDINGS: Lower chest: Streaky bibasilar atelectasis, right greater than left. The heart is normal in size for age. No pericardial effusion. Pacer wires are noted. Coronary artery calcifications. Hepatobiliary: No focal hepatic lesions or intrahepatic biliary dilatation. Layering small gallstones nerve gallbladder. No common bile duct dilatation. Pancreas: No mass, inflammation or ductal dilatation. Spleen: Normal size.  No focal lesions. Adrenals/Urinary Tract: Bilateral adrenal gland metastasis are again demonstrated. Renal cysts are noted but no worrisome renal lesions or obstructing ureteral calculi. Chronic right UPJ obstruction. The bladder is decompressed by Foley catheter. Stomach/Bowel: The stomach, duodenum, small bowel and colon are grossly  normal. No inflammatory changes, mass lesions or obstructive findings. The terminal ileum is normal. The appendix is normal. Vascular/Lymphatic: Advanced atherosclerotic calcifications involving the aorta and branch vessels. No mesenteric or retroperitoneal mass or lymphadenopathy. Other: No pelvic mass or adenopathy. No pre pelvic fluid collections. No inguinal mass or adenopathy. Musculoskeletal: No significant bony findings. THORACIC SPINE: Large right apical lung mass invading the chest wall and directly involving the first right and second ribs which are destroyed. There is also tumor destroying the right aspect of T1, T2 and T3. Pathologic compression fracture of T2. Mild canal compromise is also suspected but MR is recommended for further evaluation. A left upper lobe lung lesion is noted along with severe emphysematous changes. Advanced atherosclerotic calcifications involving the aorta but no aneurysm. Lumbar spine: The advanced degenerative lumbar spondylosis with multilevel disc disease and facet disease. T12 compression fracture is noted. No lumbar compression fractures. No obvious destructive bone metastasis. Multilevel disc disease  and facet disease. IMPRESSION: 1. Large right apical/paraspinal mass directly invading the T1, T2 and T3 vertebral bodies on the right side and also destroying the right first and second ribs. There is canal encroachment at T2 but further evaluation with thoracic spine MRI without and with contrast suggested. 2. Bibasilar atelectasis, lung lesions and severe emphysema. 3. Bilateral adrenal gland metastasis. 4. Advanced atherosclerotic calcifications involving the aorta iliac arteries. 5. Remote compression fracture of T12. 6. No acute abdominal/pelvic findings. Electronically Signed   By: Marijo Sanes M.D.   On: 07/01/2016 16:08   Ct Lumbar Spine Wo Contrast  07/01/2016  CLINICAL DATA:  Lower extremity weakness. Cancer patient receiving chemotherapy and radiation  therapy. EXAM: CT ABDOMEN WITHOUT CONTRAST CT PELVIS WITHOUt CONTRAST CT THORACIC SPINE CT LUMBAR SPINE TECHNIQUE: Multidetector CT imaging of the abdomen was performed using the standard protocol. Multidetector CT imaging of the pelvis was performed following the standard protocol without intravenous contrast. Using the CT data thoracic and lumbar spine examinations or degenerated. COMPARISON:  CT SCAN 04/29/2016 FINDINGS: Lower chest: Streaky bibasilar atelectasis, right greater than left. The heart is normal in size for age. No pericardial effusion. Pacer wires are noted. Coronary artery calcifications. Hepatobiliary: No focal hepatic lesions or intrahepatic biliary dilatation. Layering small gallstones nerve gallbladder. No common bile duct dilatation. Pancreas: No mass, inflammation or ductal dilatation. Spleen: Normal size.  No focal lesions. Adrenals/Urinary Tract: Bilateral adrenal gland metastasis are again demonstrated. Renal cysts are noted but no worrisome renal lesions or obstructing ureteral calculi. Chronic right UPJ obstruction. The bladder is decompressed by Foley catheter. Stomach/Bowel: The stomach, duodenum, small bowel and colon are grossly normal. No inflammatory changes, mass lesions or obstructive findings. The terminal ileum is normal. The appendix is normal. Vascular/Lymphatic: Advanced atherosclerotic calcifications involving the aorta and branch vessels. No mesenteric or retroperitoneal mass or lymphadenopathy. Other: No pelvic mass or adenopathy. No pre pelvic fluid collections. No inguinal mass or adenopathy. Musculoskeletal: No significant bony findings. THORACIC SPINE: Large right apical lung mass invading the chest wall and directly involving the first right and second ribs which are destroyed. There is also tumor destroying the right aspect of T1, T2 and T3. Pathologic compression fracture of T2. Mild canal compromise is also suspected but MR is recommended for further evaluation. A  left upper lobe lung lesion is noted along with severe emphysematous changes. Advanced atherosclerotic calcifications involving the aorta but no aneurysm. Lumbar spine: The advanced degenerative lumbar spondylosis with multilevel disc disease and facet disease. T12 compression fracture is noted. No lumbar compression fractures. No obvious destructive bone metastasis. Multilevel disc disease and facet disease. IMPRESSION: 1. Large right apical/paraspinal mass directly invading the T1, T2 and T3 vertebral bodies on the right side and also destroying the right first and second ribs. There is canal encroachment at T2 but further evaluation with thoracic spine MRI without and with contrast suggested. 2. Bibasilar atelectasis, lung lesions and severe emphysema. 3. Bilateral adrenal gland metastasis. 4. Advanced atherosclerotic calcifications involving the aorta iliac arteries. 5. Remote compression fracture of T12. 6. No acute abdominal/pelvic findings. Electronically Signed   By: Marijo Sanes M.D.   On: 07/01/2016 16:08   US Renal  07/01/2016  CLINICAL DATA:  Acute renal failure EXAM: RENAL / URINARY TRACT ULTRASOUND COMPLETE COMPARISON:  CT scan July 01, 2016 FINDINGS: Right Kidney: Length: 13.5 cm. There is a large cyst measuring greater than 6 cm in the right kidney, also seen on the recent CT scan. No hydronephrosis. Left  Kidney: Length: 12.3 cm. Echogenicity within normal limits. No mass or hydronephrosis visualized. Bladder: The bladder is decompressed with a Foley catheter. IMPRESSION: 1. No hydronephrosis. Mild increased echogenicity associated with both renal cortices, likely medical renal disease. Electronically Signed   By: Dorise Bullion III M.D   On: 07/01/2016 23:16   Dg Chest Portable 1 View  06/12/2016  CLINICAL DATA:  Bilateral lower extremity swelling, abdominal swelling and fatigue. History of lung cancer. EXAM: PORTABLE CHEST 1 VIEW COMPARISON:  05/20/2016. FINDINGS: Stable mild cardiac  enlargement. Stable tortuosity and calcification of the thoracic aorta. Stable surgical changes from bypass surgery. The right ventricular pacer wire and right PICC line are stable. Stable right paratracheal soft tissue density. No acute overlying pulmonary process. No pleural effusions. The bony thorax is intact. IMPRESSION: No acute cardiopulmonary findings. Electronically Signed   By: Marijo Sanes M.D.   On: 06/12/2016 18:15   Dg Abd 2 Views  06/12/2016  CLINICAL DATA:  82 year old male with constipation and bilateral leg swelling. Abdominal pain. EXAM: ABDOMEN - 2 VIEW COMPARISON:  CT of the abdomen pelvis dated 04/29/2016 FINDINGS: Copious amount of dense stool noted throughout the colon. There is no bowel dilatation or evidence of obstruction. No free air. No radiopaque calculi. There is advanced osteopenia with extensive degenerative changes of the spine and scoliosis. No acute fracture. Multiple surgical clips noted in the upper abdomen. Bibasilar linear atelectasis/ scarring. A small right pleural effusion noted. There is median sternotomy wires and CABG vascular clips. Left pectoral AICD device. IMPRESSION: Constipation.  No bowel obstruction. Electronically Signed   By: Anner Crete M.D.   On: 06/12/2016 19:57   Dg Abd Acute W/chest  07/01/2016  CLINICAL DATA:  Loose stools. EXAM: DG ABDOMEN ACUTE W/ 1V CHEST COMPARISON:  06/12/2016 FINDINGS: The upright chest x-ray demonstrates cardiac enlargement and stable surgical changes from bypass surgery. There is tortuosity and calcification of the thoracic aorta. Right-sided PICC line is in good position with its tip in the mid SVC. The pacer wires well position. No infiltrates or effusions. Two views of the abdomen demonstrate scattered air and stool throughout the colon. There are also air-filled loops of small bowel without distention or air-fluid levels. Moderate aortic and iliac artery calcifications. IMPRESSION: No acute cardiopulmonary  findings. Moderate stool throughout the. No findings for small bowel obstruction or free air. Electronically Signed   By: Marijo Sanes M.D.   On: 07/01/2016 13:34   Dg Myelography Lumbar Inj Thoracic  07/02/2016  CLINICAL DATA:  Metastatic disease to the upper thoracic spine. Assess for canal compromise FLUOROSCOPY TIME:  The 1 minutes 0 seconds. 578.84 micro gray meter squared PROCEDURE: LUMBAR PUNCTURE FOR THORACIC MYELOGRAM After thorough discussion of risks and benefits of the procedure including bleeding, infection, injury to nerves, blood vessels, adjacent structures as well as headache and CSF leak, written and oral informed consent was obtained. Consent was obtained by Dr. Nelson Chimes. Patient was positioned prone on the fluoroscopy table. Local anesthesia was provided with 1% lidocaine without epinephrine after prepped and draped in the usual sterile fashion. Puncture was performed at left L4-5 using a 3 1/2 inch 22-gauge spinal needle via left para median approach. Using a single pass through the dura, the needle was placed within the thecal sac, with return of clear CSF. 10 mL of Omnipaque-300 was injected into the thecal sac, with normal opacification of the nerve roots and cauda equina consistent with free flow within the subarachnoid space. The patient was then  moved to the trendelenburg position and contrast flowed into the Thoracic spine region. I personally performed the lumbar puncture and administered the intrathecal contrast. I also personally performed acquisition of the myelogram images. TECHNIQUE: Contiguous axial images were obtained through the Thoracic spine after the intrathecal infusion of infusion. Coronal and sagittal reconstructions were obtained of the axial image sets. FINDINGS: THORACIC MYELOGRAM FINDINGS: Limited filming due to limited upper thoracic opacity. Patient has prominent spondylosis at the thoracolumbar junction region with old partial compression fractures and  prominent anterior extradural defects. See results of the CT scan below. CT THORACIC MYELOGRAM FINDINGS: The patient was scanned initially supine and then in the decubitus position to get better contrast opacity in the upper thoracic region in this kyphotic individual. As previously seen, there is tumor involvement of the right side of the T1, T2 and T3 vertebral bodies due to direct invasion from the upper medial chest lesion. Tumor involves the intervertebral foramen on the right at T1-2 and T2-3. The T3-4 foramen is not compromised. There is a small amount of ventral epidural tumor at the T1 and T2 level, but this does not obliterate the subarachnoid space or have any compressive effect upon the cord at this moment. Aside from the vertebral body, there is involvement of the transverse process and proximal rib at T1 and the proximal rib at T2. No tumor or compressive canal stenosis is seen below that region in the thoracic spine. The patient has pronounced degenerative spondylosis in the lower thoracic and upper lumbar region with indentation of the thecal sac. IMPRESSION: Direct tumor invasion of the right side of the T1, T2 and T3 vertebral bodies. Foraminal involvement on the right at T1-2 and T2-3. Small amount of ventral epidural tumor at T1 and T2 indents the thecal sac mildly but does not result in a block or cord compression. Electronically Signed   By: Nelson Chimes M.D.   On: 07/02/2016 16:45    Microbiology: Recent Results (from the past 240 hour(s))  MRSA PCR Screening     Status: None   Collection Time: 07/01/16 10:41 PM  Result Value Ref Range Status   MRSA by PCR NEGATIVE NEGATIVE Final    Comment:        The GeneXpert MRSA Assay (FDA approved for NASAL specimens only), is one component of a comprehensive MRSA colonization surveillance program. It is not intended to diagnose MRSA infection nor to guide or monitor treatment for MRSA infections.      Labs: Basic Metabolic  Panel:  Recent Labs Lab 07/01/16 1250 07/02/16 0100 07/04/16 0435  NA 136 137 137  K 3.8 3.8 4.2  CL 103 103 104  CO2 '26 27 28  '$ GLUCOSE 180* 255* 200*  BUN 27* 20 27*  CREATININE 1.81* 1.56* 1.08  CALCIUM 8.4* 8.6* 8.9   Liver Function Tests:  Recent Labs Lab 07/01/16 1250 07/02/16 0100  AST 21 19  ALT 27 27  ALKPHOS 69 66  BILITOT 2.4* 2.0*  PROT 5.4* 5.2*  ALBUMIN 2.6* 2.4*   No results for input(s): LIPASE, AMYLASE in the last 168 hours. No results for input(s): AMMONIA in the last 168 hours. CBC:  Recent Labs Lab 07/01/16 1250 07/02/16 0100 07/04/16 0435  WBC 4.5 3.6* 4.0  NEUTROABS 3.3 3.3  --   HGB 8.9* 8.6* 8.6*  HCT 27.2* 27.1* 26.9*  MCV 90.1 90.9 91.8  PLT 152 134* 116*   Cardiac Enzymes:  Recent Labs Lab 07/01/16 1250  TROPONINI <0.03  BNP: BNP (last 3 results)  Recent Labs  02/28/16 0215 06/12/16 1845 07/01/16 1250  BNP 242.2* 224.0* 348.0*    ProBNP (last 3 results) No results for input(s): PROBNP in the last 8760 hours.  CBG:  Recent Labs Lab 07/05/16 0419 07/05/16 0822 07/05/16 1153 07/05/16 1701 07/05/16 2036  GLUCAP 275* 206* 212* 227* 243*    Signed:  Velvet Bathe MD.  [facility] 07/06/2016, 10:29 AM

## 2016-07-08 ENCOUNTER — Ambulatory Visit: Payer: Medicare Other

## 2016-07-08 ENCOUNTER — Emergency Department (HOSPITAL_COMMUNITY): Payer: Medicare Other

## 2016-07-08 ENCOUNTER — Encounter (HOSPITAL_COMMUNITY): Payer: Self-pay

## 2016-07-08 ENCOUNTER — Inpatient Hospital Stay (HOSPITAL_COMMUNITY)
Admission: EM | Admit: 2016-07-08 | Discharge: 2016-07-09 | DRG: 871 | Disposition: A | Discharge status: Hospice | Payer: Medicare Other | Attending: Internal Medicine | Admitting: Internal Medicine

## 2016-07-08 DIAGNOSIS — I110 Hypertensive heart disease with heart failure: Secondary | ICD-10-CM | POA: Diagnosis present

## 2016-07-08 DIAGNOSIS — Y95 Nosocomial condition: Secondary | ICD-10-CM | POA: Diagnosis present

## 2016-07-08 DIAGNOSIS — N179 Acute kidney failure, unspecified: Secondary | ICD-10-CM | POA: Diagnosis present

## 2016-07-08 DIAGNOSIS — C7951 Secondary malignant neoplasm of bone: Secondary | ICD-10-CM | POA: Diagnosis present

## 2016-07-08 DIAGNOSIS — Z7984 Long term (current) use of oral hypoglycemic drugs: Secondary | ICD-10-CM

## 2016-07-08 DIAGNOSIS — Z66 Do not resuscitate: Secondary | ICD-10-CM | POA: Diagnosis present

## 2016-07-08 DIAGNOSIS — Z87891 Personal history of nicotine dependence: Secondary | ICD-10-CM

## 2016-07-08 DIAGNOSIS — I252 Old myocardial infarction: Secondary | ICD-10-CM

## 2016-07-08 DIAGNOSIS — Z955 Presence of coronary angioplasty implant and graft: Secondary | ICD-10-CM

## 2016-07-08 DIAGNOSIS — N390 Urinary tract infection, site not specified: Secondary | ICD-10-CM | POA: Diagnosis present

## 2016-07-08 DIAGNOSIS — I472 Ventricular tachycardia, unspecified: Secondary | ICD-10-CM

## 2016-07-08 DIAGNOSIS — Z951 Presence of aortocoronary bypass graft: Secondary | ICD-10-CM | POA: Diagnosis not present

## 2016-07-08 DIAGNOSIS — Z515 Encounter for palliative care: Secondary | ICD-10-CM | POA: Diagnosis present

## 2016-07-08 DIAGNOSIS — Z7982 Long term (current) use of aspirin: Secondary | ICD-10-CM

## 2016-07-08 DIAGNOSIS — Z7901 Long term (current) use of anticoagulants: Secondary | ICD-10-CM

## 2016-07-08 DIAGNOSIS — C3410 Malignant neoplasm of upper lobe, unspecified bronchus or lung: Secondary | ICD-10-CM | POA: Diagnosis present

## 2016-07-08 DIAGNOSIS — E1140 Type 2 diabetes mellitus with diabetic neuropathy, unspecified: Secondary | ICD-10-CM | POA: Diagnosis present

## 2016-07-08 DIAGNOSIS — T451X5A Adverse effect of antineoplastic and immunosuppressive drugs, initial encounter: Secondary | ICD-10-CM | POA: Diagnosis present

## 2016-07-08 DIAGNOSIS — N400 Enlarged prostate without lower urinary tract symptoms: Secondary | ICD-10-CM | POA: Diagnosis present

## 2016-07-08 DIAGNOSIS — A419 Sepsis, unspecified organism: Principal | ICD-10-CM | POA: Diagnosis present

## 2016-07-08 DIAGNOSIS — F419 Anxiety disorder, unspecified: Secondary | ICD-10-CM | POA: Diagnosis present

## 2016-07-08 DIAGNOSIS — E1151 Type 2 diabetes mellitus with diabetic peripheral angiopathy without gangrene: Secondary | ICD-10-CM | POA: Diagnosis present

## 2016-07-08 DIAGNOSIS — I5022 Chronic systolic (congestive) heart failure: Secondary | ICD-10-CM | POA: Diagnosis present

## 2016-07-08 DIAGNOSIS — C3411 Malignant neoplasm of upper lobe, right bronchus or lung: Secondary | ICD-10-CM | POA: Diagnosis present

## 2016-07-08 DIAGNOSIS — I255 Ischemic cardiomyopathy: Secondary | ICD-10-CM | POA: Diagnosis present

## 2016-07-08 DIAGNOSIS — R222 Localized swelling, mass and lump, trunk: Secondary | ICD-10-CM | POA: Diagnosis present

## 2016-07-08 DIAGNOSIS — Z79899 Other long term (current) drug therapy: Secondary | ICD-10-CM

## 2016-07-08 DIAGNOSIS — R509 Fever, unspecified: Secondary | ICD-10-CM | POA: Diagnosis not present

## 2016-07-08 DIAGNOSIS — A499 Bacterial infection, unspecified: Secondary | ICD-10-CM | POA: Diagnosis present

## 2016-07-08 DIAGNOSIS — J189 Pneumonia, unspecified organism: Secondary | ICD-10-CM | POA: Diagnosis present

## 2016-07-08 DIAGNOSIS — I482 Chronic atrial fibrillation, unspecified: Secondary | ICD-10-CM | POA: Diagnosis present

## 2016-07-08 DIAGNOSIS — Z950 Presence of cardiac pacemaker: Secondary | ICD-10-CM | POA: Diagnosis not present

## 2016-07-08 DIAGNOSIS — I2510 Atherosclerotic heart disease of native coronary artery without angina pectoris: Secondary | ICD-10-CM | POA: Diagnosis present

## 2016-07-08 DIAGNOSIS — D61810 Antineoplastic chemotherapy induced pancytopenia: Secondary | ICD-10-CM | POA: Diagnosis present

## 2016-07-08 LAB — CBC WITH DIFFERENTIAL/PLATELET
BASOS PCT: 0 %
Basophils Absolute: 0 10*3/uL (ref 0.0–0.1)
EOS ABS: 0.1 10*3/uL (ref 0.0–0.7)
Eosinophils Relative: 2 %
HCT: 29.4 % — ABNORMAL LOW (ref 39.0–52.0)
Hemoglobin: 9.4 g/dL — ABNORMAL LOW (ref 13.0–17.0)
LYMPHS ABS: 0.1 10*3/uL — AB (ref 0.7–4.0)
Lymphocytes Relative: 2 %
MCH: 30.3 pg (ref 26.0–34.0)
MCHC: 32 g/dL (ref 30.0–36.0)
MCV: 94.8 fL (ref 78.0–100.0)
MONO ABS: 0.2 10*3/uL (ref 0.1–1.0)
Monocytes Relative: 6 %
NEUTROS ABS: 3 10*3/uL (ref 1.7–7.7)
NEUTROS PCT: 90 %
PLATELETS: 96 10*3/uL — AB (ref 150–400)
RBC: 3.1 MIL/uL — ABNORMAL LOW (ref 4.22–5.81)
RDW: 19.8 % — AB (ref 11.5–15.5)
WBC: 3.4 10*3/uL — ABNORMAL LOW (ref 4.0–10.5)

## 2016-07-08 LAB — URINALYSIS, ROUTINE W REFLEX MICROSCOPIC
GLUCOSE, UA: 250 mg/dL — AB
Nitrite: NEGATIVE
PROTEIN: 100 mg/dL — AB
Specific Gravity, Urine: >1.03 — ABNORMAL HIGH (ref 1.005–1.030)
pH: 5.5 (ref 5.0–8.0)

## 2016-07-08 LAB — COMPREHENSIVE METABOLIC PANEL
ALK PHOS: 45 U/L (ref 38–126)
ALT: 101 U/L — ABNORMAL HIGH (ref 17–63)
AST: 76 U/L — AB (ref 15–41)
Albumin: 1.9 g/dL — ABNORMAL LOW (ref 3.5–5.0)
Anion gap: 7 (ref 5–15)
BILIRUBIN TOTAL: 2.6 mg/dL — AB (ref 0.3–1.2)
BUN: 30 mg/dL — AB (ref 6–20)
CALCIUM: 8 mg/dL — AB (ref 8.9–10.3)
CO2: 24 mmol/L (ref 22–32)
CREATININE: 1.31 mg/dL — AB (ref 0.61–1.24)
Chloride: 109 mmol/L (ref 101–111)
GFR calc Af Amer: 58 mL/min — ABNORMAL LOW (ref >60)
GFR, EST NON AFRICAN AMERICAN: 50 mL/min — AB (ref >60)
Glucose, Bld: 287 mg/dL — ABNORMAL HIGH (ref 65–99)
POTASSIUM: 4 mmol/L (ref 3.5–5.1)
Sodium: 140 mmol/L (ref 135–145)
TOTAL PROTEIN: 4.5 g/dL — AB (ref 6.5–8.1)

## 2016-07-08 LAB — I-STAT CG4 LACTIC ACID, ED
LACTIC ACID, VENOUS: 3.98 mmol/L — AB (ref 0.5–1.9)
Lactic Acid, Venous: 2.82 mmol/L (ref 0.5–1.9)

## 2016-07-08 LAB — URINE MICROSCOPIC-ADD ON: Squamous Epithelial / LPF: NONE SEEN

## 2016-07-08 MED ORDER — ALBUTEROL SULFATE (2.5 MG/3ML) 0.083% IN NEBU: 2.5000 mg | INHALATION_SOLUTION | RESPIRATORY_TRACT | Status: DC | PRN

## 2016-07-08 MED ORDER — MORPHINE SULFATE (PF) 2 MG/ML IV SOLN
2.0000 mg | INTRAVENOUS | Status: DC | PRN
  Administered 2016-07-09 (×2): 2 mg via INTRAVENOUS
  Filled 2016-07-08 (×2): qty 1

## 2016-07-08 MED ORDER — PIPERACILLIN-TAZOBACTAM 3.375 G IVPB 30 MIN
3.3750 g | Freq: Once | INTRAVENOUS | Status: AC
  Administered 2016-07-08: 3.375 g via INTRAVENOUS
  Filled 2016-07-08: qty 50

## 2016-07-08 MED ORDER — SODIUM CHLORIDE 0.9 % IV BOLUS (SEPSIS)
3000.0000 mL | Freq: Once | INTRAVENOUS | Status: AC
Start: 2016-07-08 — End: 2016-07-08
  Administered 2016-07-08: 3000 mL via INTRAVENOUS

## 2016-07-08 MED ORDER — ACETAMINOPHEN 325 MG PO TABS
650.0000 mg | ORAL_TABLET | Freq: Four times a day (QID) | ORAL | Status: DC | PRN
  Administered 2016-07-09: 650 mg via ORAL
  Filled 2016-07-08: qty 2

## 2016-07-08 MED ORDER — ACETAMINOPHEN 650 MG RE SUPP: 650.0000 mg | Freq: Four times a day (QID) | RECTAL | Status: DC | PRN

## 2016-07-08 MED ORDER — FENTANYL 50 MCG/HR TD PT72: 50.0000 ug | MEDICATED_PATCH | TRANSDERMAL | Status: DC

## 2016-07-08 MED ORDER — FENTANYL 75 MCG/HR TD PT72: 75.0000 ug | MEDICATED_PATCH | TRANSDERMAL | Status: DC

## 2016-07-08 MED ORDER — VANCOMYCIN HCL IN DEXTROSE 1-5 GM/200ML-% IV SOLN
1000.0000 mg | Freq: Once | INTRAVENOUS | Status: AC
Start: 2016-07-08 — End: 2016-07-08
  Administered 2016-07-08: 1000 mg via INTRAVENOUS
  Filled 2016-07-08: qty 200

## 2016-07-08 MED ORDER — SODIUM CHLORIDE 0.9 % IV BOLUS (SEPSIS)
1000.0000 mL | Freq: Once | INTRAVENOUS | Status: AC
  Administered 2016-07-08: 1000 mL via INTRAVENOUS

## 2016-07-08 NOTE — ED Notes (Signed)
Pt has edema to right arm, bilateral lower legs and chucks placed due to large fluid filled blisters on left leg that are oozing liquid

## 2016-07-08 NOTE — ED Notes (Signed)
Dr Roderic Palau informed of canceled transfer to [facility], secretary instructed to cancel transport and family aware of hold on transfer and admission to [facility]. Nurse informed.

## 2016-07-08 NOTE — ED Notes (Signed)
Pt has 2 fentanyl patch 25 mcg on right chest and 50 mcg left chest.

## 2016-07-08 NOTE — ED Notes (Signed)
Pt repositioned with multiple pillows for comfort and to prevent pressure sores (any additional pressure sores)

## 2016-07-08 NOTE — H&P (Addendum)
History and Physical    George Pollard MPN:[id number] DOB: [birth year] DOA: 07/08/2016  PCP: Monico Blitz, MD  Patient coming from: Home  Chief Complaint: Generalized weakness, cough, fever.  HPI: George Pollard is a 82 y.o. 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 He was just discharged from [facility] 2 days ago for evaluation of lower extremity weakness. CT myelogram revealed direct tumor invasion of the right side of the T1, T2, and T3 vertebral bodies, but no block or cord compression. Radiation oncology was consulted and the patient underwent radiation therapy. Following discharge, he has had progressive generalized weakness, subjective fever and chills, a productive cough with green sputum, and generalized weakness. His appetite has been poor and has been sleeping all day.   ED Course: In the ED, the patient was relatively hypotensive with a systolic blood pressure in the 80s to 90s; tachycardic; and afebrile. His EKG revealed atrial fibrillation and wide complex tachycardia with a heart rate of 121 bpm. His chest x-ray revealed airspace consolidation throughout the left mid and upper lung zones; new from one week prior. His lab data were significant for a WBC of 3.4, hemoglobin 9.4, platelet count of 96, lactic acid of 3.98. His urinalysis revealed many bacteria and too numerous to count WBCs. The plan is to transfer the patient to [facility] for ongoing radiation therapy. This was recommended by his oncologist, Dr. Whitney Muse who communicated this to Dr. Roderic Palau within communicated to the dictating physician. When I went back to discuss the disposition to the family, apparently, the patient has become minimally responsive. They desired  to keep the patient here at [facility] to be placed on comfort care as they felt he was actively dying.   Review of Systems: As per HPI; in addition, he has had weakness in his lower legs, swelling in his legs bilaterally, and overall deconditioning and debilitation.    Past Medical History:  Diagnosis Date  . AICD (automatic cardioverter/defibrillator) present   . Anxiety   . Atrial fibrillation ([place])   . BPH (benign prostatic hyperplasia)   . CAD (coronary artery disease)    a. CABG 1997. b. NSTEMI (troponin >20) s/p PTCA/DES to LCx 10/21/12, overlapping DES to mid & distal RCA 10/26/12; c. 02/2016 MV: EF 37%, large fixed defect - apex, inf, septal, lat wall, no ischemia.  . Cardiomyopathy ([place])   . Diabetes mellitus ([place])    a. Noted 10/2012 (prior hx of DM resolved with weight loss).  . H/O echocardiogram 02/2016   EF 30-35%  . Hypertension   . Lung cancer ([place])    pancoast tumor right lung apex invading mediastinum and C7, T1, T2 with extension into the neural foramina and into the spinal canal  . NSTEMI (non-ST elevated myocardial infarction) ([place]) 10/19/2012  . Pancoast tumor of right lung ([place]) 05/17/2016  . PVD (peripheral vascular disease) ([place])    a. AAA repair 2001.  Marland Kitchen Shortness of breath dyspnea   . Shoulder pain   . Systolic CHF ([place])    a. 00/8676: acute on chronic systolic CHF / pulmonary edema secondary to severe mixed cardiomyopathy;  b. 02/2016 Echo: EF 30-35%, sev dil RV, RV dysfxn, massively dil RA, PASP 61mHg.  .[place]entricular tachycardia ([place]    a. 02/2016 4 ICD shocks for  VT-->Amio added.    Past Surgical History:  Procedure Laterality Date  . ABDOMINAL AORTIC ANEURYSM REPAIR     08/26/2000  . CARDIAC CATHETERIZATION    . CATARACT EXTRACTION    . CORONARY ARTERY BYPASS GRAFT     1997  . LEFT HEART CATHETERIZATION WITH CORONARY/GRAFT ANGIOGRAM N/A 10/21/2012   Procedure: LEFT HEART CATHETERIZATION WITH Beatrix Fetters;  Surgeon: Burnell Blanks, MD;  Location: [facility] CATH LAB;  Service: Cardiovascular;  Laterality: N/A;  . MEDIASTINOSCOPY N/A 05/03/2016   Procedure: MEDIASTINOSCOPY;  Surgeon: Ivin Poot, MD;  Location: [place];  Service: Thoracic;  Laterality: N/A;  . PERCUTANEOUS CORONARY STENT INTERVENTION (PCI-S) N/A 10/26/2012   Procedure: PERCUTANEOUS CORONARY STENT INTERVENTION (PCI-S);  Surgeon: Sherren Mocha, MD;  Location: [facility] CATH LAB;  Service: Cardiovascular;  Laterality: N/A;  . TONSILLECTOMY    . VIDEO BRONCHOSCOPY WITH ENDOBRONCHIAL ULTRASOUND N/A 05/03/2016   Procedure: VIDEO BRONCHOSCOPY WITH ENDOBRONCHIAL ULTRASOUND;  Surgeon: Ivin Poot, MD;  Location: [facility] OR;  Service: Thoracic;  Laterality: N/A;    Social History: He is married and has children. He reports that he quit smoking about 20 years ago. His smoking use included Cigarettes. He has a 40.00 pack-year smoking history. He has never used smokeless tobacco. He reports that he does not drink alcohol or use drugs.  No Known Allergies  Family History  Problem Relation Age of Onset  . Heart attack Mother 3  . Congestive Heart Failure Mother   . Alzheimer's disease Father   . Diabetes Father   . Valvular heart disease Daughter     MVP repair     Prior to Admission medications   Medication Sig Start Date End Date Taking? Authorizing Provider  amiodarone (PACERONE) 200 MG tablet Take 1 tablet by mouth 2 (two) times daily. 03/26/16   Historical Provider, MD  aspirin EC 81 MG tablet Take 81 mg by mouth daily.    Historical Provider, MD  atorvastatin (LIPITOR) 80 MG tablet take 1 tablet by mouth once daily 12/18/12   Jolaine Artist, MD  CARBOPLATIN IV Inject into the vein. To be given weekly with radiation    Historical Provider, MD  feeding supplement (BOOST HIGH PROTEIN) LIQD Take 1 Container by mouth 2 (two) times daily. Chocolate/Strawberry    Historical Provider, MD  fentaNYL (DURAGESIC - DOSED MCG/HR) 50 MCG/HR Place 1 patch (50 mcg total)  onto the skin every 3 (three) days. 06/27/16   Baird Cancer, PA-C  furosemide (LASIX) 40 MG tablet Take 1 tablet (40 mg total) by mouth 2 (two) times daily. Patient taking differently: Take 40 mg by mouth 3 (three) times daily.  04/05/16   Evans Lance, MD  gabapentin (NEURONTIN) 300 MG capsule Take 1 capsule (300 mg total) by mouth at bedtime. 05/30/16   Patrici Ranks, MD  Heparin Lock Flush (HEPARIN FLUSH, PORCINE,) 100 UNIT/ML injection Flush PICC line twice a week. First flush with Saline. Then Flush PICC line with 2.75m of Heparin. 05/28/16   SPatrici Ranks MD  HYDROcodone-acetaminophen (NORCO) 10-325 MG tablet Take 1 tablet by mouth every 4 (four) hours as needed. Patient taking differently: Take 1 tablet by mouth every 4 (four) hours as needed. For pain 06/06/16   TBaird Cancer PA-C  lisinopril (PRINIVIL,ZESTRIL) 2.5 MG tablet take 1 tablet by mouth once daily 12/18/12   DJolaine Artist MD  metFORMIN (GLUCOPHAGE) 500 MG tablet Take 500 mg by mouth daily.  01/22/16   Historical  Provider, MD  Metoprolol Tartrate (LOPRESSOR) 50 MG tablet Take 1.5 tablets (75 mg total) by mouth 2 (two) times daily. 03/01/16   Rogelia Mire, NP  nitroGLYCERIN (NITROSTAT) 0.4 MG SL tablet Place 1 tablet (0.4 mg total) under the tongue every 5 (five) minutes as needed for chest pain (up to 3 doses). 10/29/12   Dayna N Dunn, PA-C  ondansetron (ZOFRAN) 8 MG tablet Take 1 tablet (8 mg total) by mouth every 8 (eight) hours as needed for nausea or vomiting. 06/27/16   Baird Cancer, PA-C  PACLitaxel (TAXOL IV) Inject into the vein. To be given weekly with radiation    Historical Provider, MD  Potassium Bicarb-Citric Acid 20 MEQ TBEF Take 1 tablet (20 mEq total) by mouth daily. 06/13/16   Patrici Ranks, MD  prochlorperazine (COMPAZINE) 10 MG tablet Take 1 tablet (10 mg total) by mouth every 6 (six) hours as needed for nausea or vomiting. 06/27/16   Baird Cancer, PA-C  Sodium Chloride Flush (NORMAL  SALINE FLUSH) 0.9 % SOLN Flush PICC line twice a week with 19m prior to flushing with Heparin. 05/20/16   TBaird Cancer PA-C  sucralfate (CARAFATE) 1 g tablet Take 1 tablet (1 g total) by mouth 4 (four) times daily -  with meals and at bedtime. 5 min before meals for radiation induced esophagitis 06/07/16   MTyler Pita MD  tamsulosin (FLOMAX) 0.4 MG CAPS capsule Take 1 capsule (0.4 mg total) by mouth daily after supper. 07/06/16   OVelvet Bathe MD  warfarin (COUMADIN) 5 MG tablet Take 1 tablet (5 mg total) by mouth daily at 6 PM. 03/01/16   CRogelia Mire NP    Physical Exam: Vitals:   07/08/16 1214 07/08/16 1300 07/08/16 1330 07/08/16 1400  BP: 96/61 (!) 87/59  (!) 78/53  Pulse: 103 (!) 42 (!) 39 118  Resp: '22 22 19 15  '$ Temp: 98.3 F (36.8 C)     TempSrc: Oral     SpO2: 94% (!) 71% 100% 94%  Weight:      Height:          Constitutional: NAD, calm, comfortable Vitals:   07/08/16 1214 07/08/16 1300 07/08/16 1330 07/08/16 1400  BP: 96/61 (!) 87/59  (!) 78/53  Pulse: 103 (!) 42 (!) 39 118  Resp: '22 22 19 15  '$ Temp: 98.3 F (36.8 C)     TempSrc: Oral     SpO2: 94% (!) 71% 100% 94%  Weight:      Height:       Eyes: PERRL, lids and conjunctivae normal ENMT: Mucous membranes are mildly dry.  Posterior pharynx clear of any exudate or lesions.  Neck: normal, supple, no masses, no thyromegaly Respiratory: Bilateral crackles and occasional wheezes. Normal respiratory mildly labored with speaking but not at rest. No accessory muscle use.>>> Follow-up examination reveals that the patient is in no acute distress, not short of breath, and is sleeping comfortably. Cardiovascular: Irregular irregular.. 2+ pedal pulses. 2+ bilateral lower extremity pitting edema. No carotid bruits.  Abdomen: no tenderness, no masses palpated. No hepatosplenomegaly. Bowel sounds positive.  Musculoskeletal: No acute hot red joints.; no clubbing / cyanosis. No joint deformity upper and lower  extremities. Good ROM, no contractures.   Skin: Multiple skin tears and ecchymosis on arms and a few ecchymosis on the legs; few blisters on the left lateral leg Neurologic: CN 2-12 grossly intact. Sensation intact, Strength -patient is unable to lift his leg against gravity, but he is  able to move him. Handgrip bilaterally 5 over 5. >>>> On follow-up examination, the patient is obtunded, but comfortable. Psychiatric: Normal judgment and insight. Alert and oriented x 3. Normal mood.     Labs on Admission: I have personally reviewed following labs and imaging studies  CBC:  Recent Labs Lab 07/02/16 0100 07/04/16 0435 07/08/16 1014  WBC 3.6* 4.0 3.4*  NEUTROABS 3.3  --  3.0  HGB 8.6* 8.6* 9.4*  HCT 27.1* 26.9* 29.4*  MCV 90.9 91.8 94.8  PLT 134* 116* 96*   Basic Metabolic Panel:  Recent Labs Lab 07/02/16 0100 07/04/16 0435 07/08/16 1014  NA 137 137 140  K 3.8 4.2 4.0  CL 103 104 109  CO2 '27 28 24  '$ GLUCOSE 255* 200* 287*  BUN 20 27* 30*  CREATININE 1.56* 1.08 1.31*  CALCIUM 8.6* 8.9 8.0*   GFR: Estimated Creatinine Clearance: 51 mL/min (by C-G formula based on SCr of 1.31 mg/dL). Liver Function Tests:  Recent Labs Lab 07/02/16 0100 07/08/16 1014  AST 19 76*  ALT 27 101*  ALKPHOS 66 45  BILITOT 2.0* 2.6*  PROT 5.2* 4.5*  ALBUMIN 2.4* 1.9*   No results for input(s): LIPASE, AMYLASE in the last 168 hours. No results for input(s): AMMONIA in the last 168 hours. Coagulation Profile:  Recent Labs Lab 07/02/16 0100 07/03/16 0430  INR 1.37 1.35   Cardiac Enzymes: No results for input(s): CKTOTAL, CKMB, CKMBINDEX, TROPONINI in the last 168 hours. BNP (last 3 results) No results for input(s): PROBNP in the last 8760 hours. HbA1C: No results for input(s): HGBA1C in the last 72 hours. CBG:  Recent Labs Lab 07/05/16 0419 07/05/16 0822 07/05/16 1153 07/05/16 1701 07/05/16 2036  GLUCAP 275* 206* 212* 227* 243*   Lipid Profile: No results for input(s):  CHOL, HDL, LDLCALC, TRIG, CHOLHDL, LDLDIRECT in the last 72 hours. Thyroid Function Tests: No results for input(s): TSH, T4TOTAL, FREET4, T3FREE, THYROIDAB in the last 72 hours. Anemia Panel: No results for input(s): VITAMINB12, FOLATE, FERRITIN, TIBC, IRON, RETICCTPCT in the last 72 hours. Urine analysis:    Component Value Date/Time   COLORURINE YELLOW 07/08/2016 1048   APPEARANCEUR CLOUDY (A) 07/08/2016 1048   LABSPEC >1.030 (H) 07/08/2016 1048   PHURINE 5.5 07/08/2016 1048   GLUCOSEU 250 (A) 07/08/2016 1048   HGBUR LARGE (A) 07/08/2016 1048   BILIRUBINUR SMALL (A) 07/08/2016 1048   KETONESUR TRACE (A) 07/08/2016 1048   PROTEINUR 100 (A) 07/08/2016 1048   NITRITE NEGATIVE 07/08/2016 1048   LEUKOCYTESUR SMALL (A) 07/08/2016 1048   Sepsis Labs: !!!!!!!!!!!!!!!!!!!!!!!!!!!!!!!!!!!!!!!!!!!! '@[id number]'$ (procalcitonin:4,lacticidven:4) ) Recent Results (from the past 240 hour(s))  MRSA PCR Screening     Status: None   Collection Time: 07/01/16 10:41 PM  Result Value Ref Range Status   MRSA by PCR NEGATIVE NEGATIVE Final    Comment:        The GeneXpert MRSA Assay (FDA approved for NASAL specimens only), is one component of a comprehensive MRSA colonization surveillance program. It is not intended to diagnose MRSA infection nor to guide or monitor treatment for MRSA infections.      Radiological Exams on Admission: Dg Chest Portable 1 View  Result Date: 07/08/2016 CLINICAL DATA:  Hypoxia.  History of lung carcinoma EXAM: PORTABLE CHEST 1 VIEW COMPARISON:  July 01, 2016 FINDINGS: There is airspace consolidation throughout portions of the left upper and mid lung regions, a change from 1 week prior. There is mild interstitial prominence on the right without airspace consolidation. There is cardiomegaly  with pulmonary vascularity within normal limits. Patient is status post internal mammary bypass grafting. Pacemaker lead is attached to the right ventricle. There is atherosclerotic  calcification aorta. No adenopathy is demonstrable. IMPRESSION: Airspace consolidation throughout the left mid and upper lung zones, new from 1 week prior. Otherwise no appreciable change. Stable cardiomegaly. Aortic atherosclerosis noted. Electronically Signed   By: Lowella Grip III M.D.   On: 07/08/2016 10:51   EKG: Independently reviewed. Atrial fibrillation alternating with wide complex tachycardia heart rate 121 bpm.  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 He was recently started on radiation therapy 3 days ago at [facility] for evidence of thoracic vertebrae metastasis. He is status post multiple rounds of chemotherapy. He presents with clinical deterioration at home and evidence of sepsis secondary to HCAP and a urinary tract infection. He was bolused IV fluids, given vancomycin and Zosyn, and oxygen. The plan was to transfer him to the stepdown unit at [facility] for treatment of pneumonia and for ongoing XRT. However, the patient continued to deteriorate in the ED with hypotensive and altered mental status. DO NOT RESUSCITATE status was confirmed. This supportive family thought that the patient appeared to be actively dying and requested that the transfer be canceled and the he remain at [facility] for comfort  care. Patient's prognosis is 24-48 hours or less.   Plan: 1. Transfer to [facility] was canceled. Will admit the patient to a medical bed. 2. Will discontinue all medications not conducive to comfort. 3. We'll continue his fentanyl patch. Will add as needed IV morphine. We'll continue his oxygen. Will add as needed albuterol nebulizer. 4. Will allow comfort feeding if he is able with full liquids. 5. Will consult palliative care.   DVT prophylaxis: None secondary to comfort care Code Status: DO NOT RESUSCITATE Family Communication: Discussed with wife, son, and daughter Disposition Plan: To be determined Consults called: Palliative care consult pending.  Admission status: Inpatient, stepdown unit   [facility] MD [facility] Pager [phone]  If 7PM-7AM, please contact night-coverage [email or web address] Password [facility]  07/08/2016, 2:36 PM

## 2016-07-08 NOTE — ED Notes (Signed)
Beeped [facility] through [facility] to [phone]

## 2016-07-08 NOTE — ED Notes (Signed)
1000 ml adm by EMS

## 2016-07-08 NOTE — ED Notes (Signed)
Call to [facility].  RN will call me for report.  [facility] has been called for transport

## 2016-07-08 NOTE — Progress Notes (Signed)
Received message from [facility]. The message indicated that Dr. Caryn Section called to report this patient has pneumonia, is septic, in ICU stepdown and family has elected for comfort care. Also, the message indicated that Dr. Caryn Section confirmed that the patient wishes to stop radiation therapy. Informed Shona Simpson, PA-C of these findings.

## 2016-07-08 NOTE — ED Provider Notes (Signed)
[facility] DEPT Provider Note   CSN: [id number] Arrival date & time: 07/08/16  4098  By signing my name below, I, Emmanuella Mensah, attest that this documentation has been prepared under the direction and in the presence of Milton Ferguson, MD. Electronically Signed: Judithann Sauger, ED Scribe. 07/08/16. 10:17 AM.   History   Chief Complaint Chief Complaint  Patient presents with  . Cough  . Fever    HPI Comments: George Pollard is a 82 y.o. male with a hx of lung cancer, pancoast tumor of right lung, CAD, AICD, DM, hypertension, PVD, and systolic CHF brought in by ambulance, who presents to the Emergency Department for evaluation for ongoing fever of 100.4 and HR of 112 onset 2 days ago. He reports associated productive cough with green sputum and generalized weakness. Wife adds that pt has been sleeping all day and has not been eating appropriately. No alleviating factors noted. Pt has not tried any medications PTA. He was admitted to the hospital on 07/01/16 for dehydration and discharged 2 days ago. No abdominal pain or n/v. Pt has had chemo treatment for approx 5 weeks and has an upcoming appointment for radiation today.     The history is provided by the patient. No language interpreter was used.  Cough  This is a new problem. The current episode started 2 days ago. The problem has not changed since onset.The cough is productive of sputum. The maximum temperature recorded prior to his arrival was 100 to 100.9 F. Associated symptoms include chest pain. He has tried nothing for the symptoms. The treatment provided moderate relief. He is not a smoker. His past medical history does not include asthma.  Fever  Associated symptoms include chest pain and cough.    Past Medical History:  Diagnosis Date  . AICD (automatic cardioverter/defibrillator) present   . Anxiety   . Atrial fibrillation ([place])   . BPH (benign prostatic hyperplasia)   . CAD (coronary artery disease)    a.  CABG 1997. b. NSTEMI (troponin >20) s/p PTCA/DES to LCx 10/21/12, overlapping DES to mid & distal RCA 10/26/12; c. 02/2016 MV: EF 37%, large fixed defect - apex, inf, septal, lat wall, no ischemia.  . Cardiomyopathy ([place])   . Diabetes mellitus ([place])    a. Noted 10/2012 (prior hx of DM resolved with weight loss).  . H/O echocardiogram 02/2016   EF 30-35%  . Hypertension   . Lung cancer ([place])    pancoast tumor right lung apex invading mediastinum and C7, T1, T2 with extension into the neural foramina and into the spinal canal  . NSTEMI (non-ST elevated myocardial infarction) ([place]) 10/19/2012  . Pancoast tumor of right lung ([place]) 05/17/2016  . PVD (peripheral vascular disease) ([place])    a. AAA repair 2001.  Marland Kitchen Shortness of breath dyspnea   . Shoulder pain   . Systolic CHF ([place])    a. 10/9146: acute on chronic systolic CHF / pulmonary edema secondary to severe mixed cardiomyopathy;  b. 02/2016 Echo: EF 30-35%, sev dil RV, RV dysfxn, massively dil RA, PASP 65mHg.  .Marland KitchenVentricular tachycardia ([place]    a. 02/2016 4 ICD shocks for VT-->Amio added.    Patient Active Problem List   Diagnosis Date Noted  . Antineoplastic chemotherapy induced pancytopenia ([place] 07/02/2016  . Diabetic neuropathy ([place] 07/02/2016  . Acute kidney injury ([place] 07/02/2016  . Weakness 07/01/2016  . Edema 07/01/2016  . Paraspinal mass 07/01/2016  . Urinary retention 07/01/2016  . Hypotension 07/01/2016  . Pressure  ulcer stage III ([place]) 06/13/2016  . Pancoast tumor of right lung ([place]) 05/17/2016  . Chronic atrial fibrillation ([place]) 03/01/2016  . Ventricular tachycardia ([place]) 03/01/2016  . Cardiomyopathy, ischemic 10/20/2012  . CAD (coronary artery disease) of artery bypass graft 10/20/2012    Past Surgical History:  Procedure Laterality Date  . ABDOMINAL AORTIC ANEURYSM REPAIR     08/26/2000  . CARDIAC CATHETERIZATION    . CATARACT EXTRACTION    . CORONARY ARTERY BYPASS GRAFT     1997  . LEFT HEART CATHETERIZATION  WITH CORONARY/GRAFT ANGIOGRAM N/A 10/21/2012   Procedure: LEFT HEART CATHETERIZATION WITH Beatrix Fetters;  Surgeon: Burnell Blanks, MD;  Location: [facility] CATH LAB;  Service: Cardiovascular;  Laterality: N/A;  . MEDIASTINOSCOPY N/A 05/03/2016   Procedure: MEDIASTINOSCOPY;  Surgeon: Ivin Poot, MD;  Location: [facility];  Service: Thoracic;  Laterality: N/A;  . PERCUTANEOUS CORONARY STENT INTERVENTION (PCI-S) N/A 10/26/2012   Procedure: PERCUTANEOUS CORONARY STENT INTERVENTION (PCI-S);  Surgeon: Sherren Mocha, MD;  Location: [facility] CATH LAB;  Service: Cardiovascular;  Laterality: N/A;  . TONSILLECTOMY    . VIDEO BRONCHOSCOPY WITH ENDOBRONCHIAL ULTRASOUND N/A 05/03/2016   Procedure: VIDEO BRONCHOSCOPY WITH ENDOBRONCHIAL ULTRASOUND;  Surgeon: Ivin Poot, MD;  Location: [facility] OR;  Service: Thoracic;  Laterality: N/A;       Home Medications    Prior to Admission medications   Medication Sig Start Date End Date Taking? Authorizing Provider  amiodarone (PACERONE) 200 MG tablet Take 1 tablet by mouth 2 (two) times daily. 03/26/16   Historical Provider, MD  aspirin EC 81 MG tablet Take 81 mg by mouth daily.    Historical Provider, MD  atorvastatin (LIPITOR) 80 MG tablet take 1 tablet by mouth once daily 12/18/12   Jolaine Artist, MD  CARBOPLATIN IV Inject into the vein. To be given weekly with radiation    Historical Provider, MD  feeding supplement (BOOST HIGH PROTEIN) LIQD Take 1 Container by mouth 2 (two) times daily. Chocolate/Strawberry    Historical Provider, MD  fentaNYL (DURAGESIC - DOSED MCG/HR) 50 MCG/HR Place 1 patch (50 mcg total) onto the skin every 3 (three) days. 06/27/16   Baird Cancer, PA-C  furosemide (LASIX) 40 MG tablet Take 1 tablet (40 mg total) by mouth 2 (two) times daily. Patient taking differently: Take 40 mg by mouth 3 (three) times daily.  04/05/16   Evans Lance, MD  gabapentin (NEURONTIN) 300 MG capsule Take 1 capsule (300 mg total) by mouth at bedtime.  05/30/16   Patrici Ranks, MD  Heparin Lock Flush (HEPARIN FLUSH, PORCINE,) 100 UNIT/ML injection Flush PICC line twice a week. First flush with Saline. Then Flush PICC line with 2.88m of Heparin. 05/28/16   SPatrici Ranks MD  HYDROcodone-acetaminophen (NORCO) 10-325 MG tablet Take 1 tablet by mouth every 4 (four) hours as needed. Patient taking differently: Take 1 tablet by mouth every 4 (four) hours as needed. For pain 06/06/16   TBaird Cancer PA-C  lisinopril (PRINIVIL,ZESTRIL) 2.5 MG tablet take 1 tablet by mouth once daily 12/18/12   DJolaine Artist MD  metFORMIN (GLUCOPHAGE) 500 MG tablet Take 500 mg by mouth daily.  01/22/16   Historical Provider, MD  Metoprolol Tartrate (LOPRESSOR) 50 MG tablet Take 1.5 tablets (75 mg total) by mouth 2 (two) times daily. 03/01/16   CRogelia Mire NP  nitroGLYCERIN (NITROSTAT) 0.4 MG SL tablet Place 1 tablet (0.4 mg total) under the tongue every 5 (five) minutes as needed for  chest pain (up to 3 doses). 10/29/12   Dayna N Dunn, PA-C  ondansetron (ZOFRAN) 8 MG tablet Take 1 tablet (8 mg total) by mouth every 8 (eight) hours as needed for nausea or vomiting. 06/27/16   Baird Cancer, PA-C  PACLitaxel (TAXOL IV) Inject into the vein. To be given weekly with radiation    Historical Provider, MD  Potassium Bicarb-Citric Acid 20 MEQ TBEF Take 1 tablet (20 mEq total) by mouth daily. 06/13/16   Patrici Ranks, MD  prochlorperazine (COMPAZINE) 10 MG tablet Take 1 tablet (10 mg total) by mouth every 6 (six) hours as needed for nausea or vomiting. 06/27/16   Baird Cancer, PA-C  Sodium Chloride Flush (NORMAL SALINE FLUSH) 0.9 % SOLN Flush PICC line twice a week with 40m prior to flushing with Heparin. 05/20/16   TBaird Cancer PA-C  sucralfate (CARAFATE) 1 g tablet Take 1 tablet (1 g total) by mouth 4 (four) times daily -  with meals and at bedtime. 5 min before meals for radiation induced esophagitis 06/07/16   MTyler Pita MD  tamsulosin (FLOMAX)  0.4 MG CAPS capsule Take 1 capsule (0.4 mg total) by mouth daily after supper. 07/06/16   OVelvet Bathe MD  warfarin (COUMADIN) 5 MG tablet Take 1 tablet (5 mg total) by mouth daily at 6 PM. 03/01/16   CRogelia Mire NP    Family History Family History  Problem Relation Age of Onset  . Heart attack Mother 448 . Congestive Heart Failure Mother   . Alzheimer's disease Father   . Diabetes Father   . Valvular heart disease Daughter     MVP repair    Social History Social History  Substance Use Topics  . Smoking status: Former Smoker    Packs/day: 2.00    Years: 20.00    Types: Cigarettes    Quit date: 12/17/1995  . Smokeless tobacco: Never Used     Comment: Quit 1997  . Alcohol use No     Allergies   Review of patient's allergies indicates no known allergies.   Review of Systems Review of Systems  Constitutional: Positive for appetite change, fatigue and fever.  HENT: Negative for ear discharge and sinus pressure.   Eyes: Negative for discharge.  Respiratory: Positive for cough.   Cardiovascular: Positive for chest pain.  Gastrointestinal: Negative for abdominal pain.  Genitourinary: Negative for frequency and hematuria.  Musculoskeletal: Negative for back pain.  Skin: Negative for rash.  Neurological: Positive for weakness. Negative for seizures.  Psychiatric/Behavioral: Negative for hallucinations.     Physical Exam Updated Vital Signs BP (!) 81/57 (BP Location: Left Arm)   Pulse 109   Temp 99.3 F (37.4 C) (Oral)   Resp 24   Ht '5\' 10"'$  (1.778 m)   Wt 200 lb (90.7 kg)   SpO2 92%   BMI 28.70 kg/m   Physical Exam  Constitutional: He is oriented to person, place, and time. He appears well-developed.  HENT:  Head: Normocephalic.  Dry mucous membrane  Eyes: Conjunctivae and EOM are normal. No scleral icterus.  Neck: Neck supple. No thyromegaly present.  Cardiovascular: Normal rate and regular rhythm.  Exam reveals no gallop and no friction rub.   No  murmur heard. Pulmonary/Chest: No stridor. He has no wheezes. He has no rales. He exhibits no tenderness.  Mild crackles bilaterally  Abdominal: He exhibits no distension. There is no tenderness. There is no rebound.  Musculoskeletal: Normal range of motion. He exhibits no  edema.  Lymphadenopathy:    He has no cervical adenopathy.  Neurological: He is oriented to person, place, and time. He exhibits normal muscle tone. Coordination normal.  Skin: No rash noted. No erythema.  Psychiatric: He has a normal mood and affect. His behavior is normal.     ED Treatments / Results  DIAGNOSTIC STUDIES: Oxygen Saturation is 92% on [place], low by my interpretation.    COORDINATION OF CARE: 10:10 AM- Pt advised of plan for treatment and pt agrees. Pt will receive lab work, EKG, and chest x-ray for further evaluation. Pt will receive IV fluids, Vancomycin IV, and Zosyn IV.    Labs (all labs ordered are listed, but only abnormal results are displayed) Labs Reviewed  CULTURE, BLOOD (ROUTINE X 2)  CULTURE, BLOOD (ROUTINE X 2)  URINE CULTURE  COMPREHENSIVE METABOLIC PANEL  CBC WITH DIFFERENTIAL/PLATELET  URINALYSIS, ROUTINE W REFLEX MICROSCOPIC (NOT AT [facility])  I-STAT BETA HCG BLOOD, ED ([facility], [facility], [facility] ONLY)  I-STAT CG4 LACTIC ACID, ED    EKG  EKG Interpretation None       Radiology No results found.  Procedures Procedures (including critical care time)  Medications Ordered in ED Medications - No data to display   Initial Impression / Assessment and Plan / ED Course  Milton Ferguson, MD has reviewed the triage vital signs and the nursing notes.  Pertinent labs & imaging results that were available during my care of the patient were reviewed by me and considered in my medical decision making (see chart for details).  Clinical Course  CRITICAL CARE Performed by: Aric Jost L Total critical care time: 45 minutes Critical care time was exclusive of separately billable procedures and  treating other patients. Critical care was necessary to treat or prevent imminent or life-threatening deterioration. Critical care was time spent personally by me on the following activities: development of treatment plan with patient and/or surrogate as well as nursing, discussions with consultants, evaluation of patient's response to treatment, examination of patient, obtaining history from patient or surrogate, ordering and performing treatments and interventions, ordering and review of laboratory studies, ordering and review of radiographic studies, pulse oximetry and re-evaluation of patient's condition.   Patient will be admitted to [facility] for pneumonia and sepsis. I spoke with Dr. Whitney Muse his oncologist she agrees with the transferred to [facility]. I also spoke with critical care felt like patient. Admitted to stepdown under medicine. Critical care will be available if necessary  Final Clinical Impressions(s) / ED Diagnoses   Final diagnoses:  None   The chart was scribed for me under my direct supervision.  I personally performed the history, physical, and medical decision making and all procedures in the evaluation of this patient..  New Prescriptions New Prescriptions   No medications on file     Milton Ferguson, MD 07/08/16 1244

## 2016-07-08 NOTE — ED Notes (Signed)
Report given RN on Dept 300

## 2016-07-08 NOTE — ED Triage Notes (Signed)
Pt  Brought in by EMS. Found lying on sofa with sats 82% on RA. Released Saturday from hospital. Reports productive cough for 3-4 days. Fever for at least 2 days,. History of lung and receiving chemo and radiation. Generalized weakness. O@ @ 4 L/min sats increase 93%. Reports not eating well . BP systolic 87 and recheck 037. CBG 311

## 2016-07-08 NOTE — Progress Notes (Addendum)
Patient with multiple wounds and foam dressings covering bilateral arms, legs, and lower coccyx and sacral area. Son at bedside, tearful with Palliative news. Patient moaning and in pain with any movement- family refused to have wounds assessed at this time, extremely protective of dad.Wound assessments deferred at this time.

## 2016-07-09 ENCOUNTER — Encounter (HOSPITAL_COMMUNITY): Payer: Self-pay | Admitting: Primary Care

## 2016-07-09 ENCOUNTER — Ambulatory Visit: Payer: Medicare Other

## 2016-07-09 DIAGNOSIS — R29818 Other symptoms and signs involving the nervous system: Secondary | ICD-10-CM

## 2016-07-09 DIAGNOSIS — Z515 Encounter for palliative care: Secondary | ICD-10-CM

## 2016-07-09 DIAGNOSIS — Z7189 Other specified counseling: Secondary | ICD-10-CM

## 2016-07-09 DIAGNOSIS — C3411 Malignant neoplasm of upper lobe, right bronchus or lung: Secondary | ICD-10-CM

## 2016-07-09 LAB — BLOOD CULTURE ID PANEL (REFLEXED)
Acinetobacter baumannii: NOT DETECTED
CANDIDA ALBICANS: NOT DETECTED
CANDIDA PARAPSILOSIS: NOT DETECTED
CANDIDA TROPICALIS: NOT DETECTED
CARBAPENEM RESISTANCE: NOT DETECTED
Candida glabrata: NOT DETECTED
Candida krusei: NOT DETECTED
ENTEROCOCCUS SPECIES: NOT DETECTED
Enterobacter cloacae complex: NOT DETECTED
Enterobacteriaceae species: NOT DETECTED
Escherichia coli: NOT DETECTED
Haemophilus influenzae: NOT DETECTED
KLEBSIELLA PNEUMONIAE: NOT DETECTED
Klebsiella oxytoca: NOT DETECTED
LISTERIA MONOCYTOGENES: NOT DETECTED
Methicillin resistance: NOT DETECTED
Neisseria meningitidis: NOT DETECTED
PROTEUS SPECIES: NOT DETECTED
Pseudomonas aeruginosa: NOT DETECTED
SERRATIA MARCESCENS: NOT DETECTED
STAPHYLOCOCCUS AUREUS BCID: NOT DETECTED
STAPHYLOCOCCUS SPECIES: NOT DETECTED
STREPTOCOCCUS PNEUMONIAE: NOT DETECTED
STREPTOCOCCUS PYOGENES: NOT DETECTED
Streptococcus agalactiae: NOT DETECTED
Streptococcus species: NOT DETECTED
VANCOMYCIN RESISTANCE: NOT DETECTED

## 2016-07-09 LAB — URINE CULTURE

## 2016-07-09 LAB — GLUCOSE, CAPILLARY
GLUCOSE-CAPILLARY: 187 mg/dL — AB (ref 65–99)
GLUCOSE-CAPILLARY: 220 mg/dL — AB (ref 65–99)
Glucose-Capillary: 162 mg/dL — ABNORMAL HIGH (ref 65–99)

## 2016-07-09 MED ORDER — MORPHINE SULFATE (CONCENTRATE) 20 MG/ML PO SOLN: 5.0000 mg | ORAL | 0 refills | Status: AC | PRN

## 2016-07-09 MED ORDER — GUAIFENESIN-DM 100-10 MG/5ML PO SYRP
10.0000 mL | ORAL_SOLUTION | ORAL | Status: DC | PRN
  Administered 2016-07-09: 10 mL via ORAL
  Filled 2016-07-09: qty 10

## 2016-07-09 NOTE — Consult Note (Signed)
Consultation Note Date: 07/09/2016   Patient Name: George Pollard  DOB: 1933/04/11  MRN: [id number]  Age / Sex: 82 y.o., male  PCP: Monico Blitz, MD Referring Physician: Rexene Alberts, MD  Reason for Consultation: Disposition, Establishing goals of care, Hospice Evaluation and Psychosocial/spiritual support  HPI/Patient Profile: 82 y.o. male  with past medical history of A field, cardiomyopathy, PVD, lung cancer admitted on 07/08/2016 with admitted to any point in hospital for comfort care/actively dying.   Clinical Assessment and Goals of Care: Mr. Safley is resting quietly in bed. He is able to tell me his name, but he believes we are in [place] at [facility]. He also thinks that it's 1955. His family present today are wife Kendrick Fries, daughters Ronn Melena, son [name] and wife Jan, and cousin Marcie Bal. They share that they feel Mr. Ohern is very near end of life and their desire is to take him home with the benefits of hospice of [facility]. We talk about focusing on comfort, adjusting medications, and unburdening him from medications that will change outcomes. Family is experienced with hospice. They tell me that Mr. Daudelin has been sharing his love for his family, and has told them that he is "ready to go". They share that he has been sleeping most of the day, interacting little. Wife states that her needs at this time include a hospital bed, and bedside suctioning.  Health care Power of atty HCPOA -  Wife Kendrick Fries   SUMMARY OF RECOMMENDATIONS   home with hospice of [facility], focus on comfort, we discuss no IV fluids or antibiotics, and only medications that provide comfort.   Code Status/Advance Care Planning:  DNR  Symptom Management:   Per Hospitalist   Palliative Prophylaxis:   Aspiration, Bowel Regimen, Frequent Pain Assessment, Palliative Wound Care and Turn  Reposition  Additional Recommendations (Limitations, Scope, Preferences):  Full Comfort Care  Psycho-social/Spiritual:   Desire for further Chaplaincy support:no  Additional Recommendations: Caregiving  Support/Resources  Prognosis:   < 2 weeks, likely based on chronic illness burden, families desire to focus on comfort only.  Discharge Planning: Home with the benefit of hospice of [facility]      Primary Diagnoses: Present on Admission: . Sepsis ([place]) . HCAP (healthcare-associated pneumonia) . Cardiomyopathy, ischemic . Paraspinal mass . Pancoast tumor of right lung ([place]) . Chronic atrial fibrillation ([place]) . Bony metastasis ([place]) . UTI (urinary tract infection), bacterial . Acute kidney injury ([place]) . Antineoplastic chemotherapy induced pancytopenia ([place]) . Diabetes mellitus with neuropathy ([place])   I have reviewed the medical record, interviewed the patient and family, and examined the patient. The following aspects are pertinent.  Past Medical History:  Diagnosis Date  . AICD (automatic cardioverter/defibrillator) present   . Anxiety   . Atrial fibrillation ([place])   . BPH (benign prostatic hyperplasia)   . CAD (coronary artery disease)    a. CABG 1997. b. NSTEMI (troponin >20) s/p PTCA/DES to LCx 10/21/12, overlapping DES to mid & distal RCA 10/26/12; c. 02/2016 MV:  EF 37%, large fixed defect - apex, inf, septal, lat wall, no ischemia.  . Cardiomyopathy ([place])   . Diabetes mellitus ([place])    a. Noted 10/2012 (prior hx of DM resolved with weight loss).  . H/O echocardiogram 02/2016   EF 30-35%  . Hypertension   . Lung cancer ([place])    pancoast tumor right lung apex invading mediastinum and C7, T1, T2 with extension into the neural foramina and into the spinal canal  . NSTEMI (non-ST elevated myocardial infarction) ([place]) 10/19/2012  . Pancoast tumor of right lung ([place]) 05/17/2016  . PVD (peripheral vascular disease) ([place])    a. AAA repair 2001.  Marland Kitchen Shortness of  breath dyspnea   . Shoulder pain   . Systolic CHF ([place])    a. 43/3295: acute on chronic systolic CHF / pulmonary edema secondary to severe mixed cardiomyopathy;  b. 02/2016 Echo: EF 30-35%, sev dil RV, RV dysfxn, massively dil RA, PASP 34mHg.  .Marland KitchenVentricular tachycardia ([place]    a. 02/2016 4 ICD shocks for VT-->Amio added.   Social History   Social History  . Marital status: Married    Spouse name: N/A  . Number of children: 4  . Years of education: N/A   Social History Main Topics  . Smoking status: Former Smoker    Packs/day: 2.00    Years: 20.00    Types: Cigarettes    Quit date: 12/17/1995  . Smokeless tobacco: Never Used     Comment: Quit 1997  . Alcohol use No  . Drug use: No  . Sexual activity: No   Other Topics Concern  . None   Social History Narrative   [place]  [place]ives [place].     Family History  Problem Relation Age of Onset  . Heart attack Mother 436 . Congestive Heart Failure Mother   . Alzheimer's disease Father   . Diabetes Father   . Valvular heart disease Daughter     MVP repair   Scheduled Meds: . fentaNYL  75 mcg Transdermal Q72H   Continuous Infusions:  PRN Meds:.acetaminophen **OR** acetaminophen, albuterol, guaiFENesin-dextromethorphan, morphine injection Medications Prior to Admission:  Prior to Admission medications   Medication Sig Start Date End Date Taking? Authorizing Provider  amiodarone (PACERONE) 200 MG tablet Take 1 tablet by mouth 2 (two) times daily. 03/26/16  Yes Historical Provider, MD  atorvastatin (LIPITOR) 80 MG tablet take 1 tablet by mouth once daily 12/18/12  Yes DJolaine Artist MD  CARBOPLATIN IV Inject into the vein. To be given weekly with radiation   Yes Historical Provider, MD  feeding supplement (BOOST HIGH PROTEIN) LIQD Take 1 Container by mouth 2 (two) times daily. Chocolate/Strawberry   Yes Historical Provider, MD  fentaNYL (DURAGESIC - DOSED MCG/HR) 25 MCG/HR patch Place 25 mcg onto the skin every 3  (three) days.   Yes Historical Provider, MD  fentaNYL (DURAGESIC - DOSED MCG/HR) 50 MCG/HR Place 1 patch (50 mcg total) onto the skin every 3 (three) days. 06/27/16  Yes TManon HildingKefalas, PA-C  furosemide (LASIX) 40 MG tablet Take 1 tablet (40 mg total) by mouth 2 (two) times daily. Patient taking differently: Take 40 mg by mouth 3 (three) times daily.  04/05/16  Yes GEvans Lance MD  gabapentin (NEURONTIN) 300 MG capsule Take 1 capsule (300 mg total) by mouth at bedtime. 05/30/16  Yes SPatrici Ranks MD  HYDROcodone-acetaminophen (NORCO) 10-325 MG tablet Take 1 tablet by mouth every 4 (four) hours as  needed. Patient taking differently: Take 1 tablet by mouth every 4 (four) hours as needed. For pain 06/06/16  Yes Manon Hilding Kefalas, PA-C  lisinopril (PRINIVIL,ZESTRIL) 2.5 MG tablet take 1 tablet by mouth once daily 12/18/12  Yes Jolaine Artist, MD  metFORMIN (GLUCOPHAGE) 500 MG tablet Take 500 mg by mouth daily.  01/22/16  Yes Historical Provider, MD  Metoprolol Tartrate (LOPRESSOR) 50 MG tablet Take 1.5 tablets (75 mg total) by mouth 2 (two) times daily. 03/01/16  Yes Rogelia Mire, NP  nitroGLYCERIN (NITROSTAT) 0.4 MG SL tablet Place 1 tablet (0.4 mg total) under the tongue every 5 (five) minutes as needed for chest pain (up to 3 doses). 10/29/12  Yes Dayna N Dunn, PA-C  ondansetron (ZOFRAN) 8 MG tablet Take 1 tablet (8 mg total) by mouth every 8 (eight) hours as needed for nausea or vomiting. 06/27/16  Yes Baird Cancer, PA-C  Potassium Bicarb-Citric Acid 20 MEQ TBEF Take 1 tablet (20 mEq total) by mouth daily. Patient taking differently: Take 20 mEq by mouth every evening.  06/13/16  Yes Patrici Ranks, MD  prochlorperazine (COMPAZINE) 10 MG tablet Take 1 tablet (10 mg total) by mouth every 6 (six) hours as needed for nausea or vomiting. 06/27/16  Yes Baird Cancer, PA-C  sucralfate (CARAFATE) 1 g tablet Take 1 tablet (1 g total) by mouth 4 (four) times daily -  with meals and at  bedtime. 5 min before meals for radiation induced esophagitis 06/07/16  Yes Tyler Pita, MD  warfarin (COUMADIN) 5 MG tablet Take 1 tablet (5 mg total) by mouth daily at 6 PM. 03/01/16  Yes Rogelia Mire, NP  aspirin EC 81 MG tablet Take 81 mg by mouth daily.    Historical Provider, MD  Heparin Lock Flush (HEPARIN FLUSH, PORCINE,) 100 UNIT/ML injection Flush PICC line twice a week. First flush with Saline. Then Flush PICC line with 2.48m of Heparin. 05/28/16   SPatrici Ranks MD  PACLitaxel (TAXOL IV) Inject into the vein. To be given weekly with radiation    Historical Provider, MD  Sodium Chloride Flush (NORMAL SALINE FLUSH) 0.9 % SOLN Flush PICC line twice a week with 170mprior to flushing with Heparin. 05/20/16   ThBaird CancerPA-C  tamsulosin (FLOMAX) 0.4 MG CAPS capsule Take 1 capsule (0.4 mg total) by mouth daily after supper. 07/06/16   OrVelvet BatheMD   No Known Allergies Review of Systems  Unable to perform ROS: Acuity of condition    Physical Exam  Constitutional: No distress.  Chronically ill appearing, frail  HENT:  Head: Normocephalic and atraumatic.  Cardiovascular: Normal rate.   Pulmonary/Chest: Effort normal. No respiratory distress.  Abdominal: Soft. He exhibits no distension.  Neurological: He is alert.  Oriented to person, place is WeElvina Sidlen GrCharcoime is [phone]: Skin is warm and dry.  No in-depth assessment of skin.  Nursing note and vitals reviewed.   Vital Signs: BP 109/62 (BP Location: Left Arm)   Pulse (!) 113   Temp (!) 100.5 F (38.1 C) (Oral)   Resp 20   Ht '5\' 10"'$  (1.778 m)   Wt 94.3 kg (208 lb)   SpO2 94%   BMI 29.84 kg/m  Pain Assessment: No/denies pain   Pain Score: Asleep   SpO2: SpO2: 94 % O2 Device:SpO2: 94 % O2 Flow Rate: .O2 Flow Rate (L/min): 2 L/min  IO: Intake/output summary:  Intake/Output Summary (Last 24 hours) at 07/09/16 1203 Last  data filed at 07/09/16 4665  Gross per 24 hour  Intake              4010 ml  Output             1400 ml  Net             2610 ml    LBM: Last BM Date: 07/01/16 Baseline Weight: Weight: 90.7 kg (200 lb) Most recent weight: Weight: 94.3 kg (208 lb)     Palliative Assessment/Data:   Flowsheet Rows   Flowsheet Row Most Recent Value  Intake Tab  Referral Department  Hospitalist  Unit at Time of Referral  Med/Surg Unit  Palliative Care Primary Diagnosis  Cancer  Date Notified  07/08/16  Palliative Care Type  New Palliative care  Reason for referral  Clarify Goals of Care, End of Life Care Assistance  Date of Admission  07/08/16  # of days IP prior to Palliative referral  0  Clinical Assessment  Palliative Performance Scale Score  20%  Pain Max last 24 hours  Not able to report  Pain Min Last 24 hours  Not able to report  Dyspnea Max Last 24 Hours  Not able to report  Dyspnea Min Last 24 hours  Not able to report  Psychosocial & Spiritual Assessment  Palliative Care Outcomes  Patient/Family meeting held?  Yes  Who was at the meeting?  Wife Kendrick Fries, adult children Brandy Hale, wife [place], cousin Marcie Bal.  Palliative Care Outcomes  Changed to focus on comfort, Transitioned to hospice, Provided end of life care assistance, Provided psychosocial or spiritual support, Clarified goals of care  Patient/Family wishes: Interventions discontinued/not started   Mechanical Ventilation  Palliative Care follow-up planned  -- [Follow-up while at [facility]]      Time In: 0930 Time Out: 1025 Time Total: 55 minutes Greater than 50%  of this time was spent counseling and coordinating care related to the above assessment and plan.  Signed by: Drue Novel, NP   Please contact Palliative Medicine Team phone at [phone] for questions and concerns.  For individual provider: See Shea Evans

## 2016-07-09 NOTE — Progress Notes (Signed)
PHARMACY - PHYSICIAN COMMUNICATION CRITICAL VALUE ALERT - BLOOD CULTURE IDENTIFICATION (BCID)  Blood cultures 3 out of 4 positive(aerobic and anaerobic from 2 sets) for GNR. Organism not identified on BCID. Family has chosen to take patient home with hospice services. No further treatment is warranted at this time.  Name of physician (or Provider) Contacted: None  Changes to prescribed antibiotics required:  None  Isac Sarna, BS [place], [place] Clinical Pharmacist Pager [phone] 07/09/2016  2:20 PM

## 2016-07-09 NOTE — Progress Notes (Signed)
Nutriton Follow-up  INTERVENTION:   Resume: Ensure Enlive po BID, each supplement provides 350 kcal and 20 grams of protein  Magic cup BID with lunch and dinner meals, each supplement provides 290 kcal and 9 grams of protein   NUTRITION DIAGNOSIS:    Inadequate oral intake related to cancer and related treatments  as evidenced by patient and family report  .  GOAL: Meet nutrition needs as able based on patient's healthcare goals    MONITOR: Po intake, labs and wt trends, progression of care goals      REASON FOR ASSESSMENT:  Malnutrition Screen      ASSESSMENT:  Pt has bee [facility] to [facility]  He is receiving chemo and radiation due to lung cancer    Last RD note 7/18: Per wife and daughter pt with poor appetite, taste changes, trouble swallowing thin liquids, thicker are better. Does not want to eat meat. Pt has been on chemo since June 2017.  Pt confirmed information after discussion with family.  Pt has a low threshold for developing malnutrition.   Medications reviewed and include: decacron, KCl, carafate Labs reviewed: CBG's: 234-277 Nutrition-Focused physical exam completed. Findings are no fat depletion, no muscle depletion, and moderate edema.   Usual weight 220 lb 4/17 Pt has lost 7% of his weight in 2 months.   Diet Order:  Diet Heart Room service appropriate? Yes; Fluid consistency: Thin Diet full liquid Room service appropriate? Yes; Fluid consistency: Thin  Skin:  Per WOC: pt has 3 partial thickness ulcers, one over the left ischium; 2 in the gluteal fold just proximal to the anus  Last BM:   none this admission  Height:   Ht Readings from Last 1 Encounters:  07/08/16 '5\' 10"'$  (1.778 m)    Weight:   Wt Readings from Last 1 Encounters:  07/08/16 208 lb (94.3 kg)    Ideal Body Weight:   75.4 kg  BMI:  Body mass index is 29.84 kg/m.  Estimated Nutritional Needs:   Kcal:   2000-2200  Protein:   105-115 gr  Fluid:   > 2.0 liters  daily  EDUCATION NEEDS:    Colman Cater MS,RD,CSG,LDN Office: [phone] Pager: [phone]

## 2016-07-09 NOTE — Care Management (Signed)
Patient Information   Patient Name George Pollard, George Pollard ([id number]) Sex Male DOB 24-Oct-1933  Room Bed  A311 A311-01  Patient Demographics   Address [place] [address] Phone [phone] (Home) E-mail Address [email or web address]  Patient Ethnicity & Race   Ethnic Group Patient Race  Not Hispanic or Latino White or Caucasian  Emergency Contact(s)   Name Relation Home Work Mobile  George Pollard Spouse [place] Daughter [phone]    George Pollard, George Pollard [phone]    Documents on File    Status Date Received Description  Documents for the Patient  [place] Not Received    [place] E-Signature HIPAA Notice of Privacy Received 62/69/48   Driver's License Not Received    Insurance Card Not Received    Advance Directives/Living Will/HCPOA/POA Not Received    HIM ROI Authorization  11/16/12   Release of Information  11/17/12   AMB Outside Hospital Record  11/17/12 11/13 card [facility] Record  11/25/12 12/13 card [facility]  Release of Information  11/26/12   HIM ROI Authorization  12/28/12 PA PP  HIM ROI Authorization  02/27/15   Release of Information  03/01/15   Other Photo ID Not Received    Insurance Card Received 04/05/16 [facility] Medicare  AMB Correspondence  05/17/16 SUBMIT [place]  Advance Directives/Living Will/HCPOA/POA  05/23/16   Advance Directives/Living Will/HCPOA/POA  05/23/16   AMB Correspondence  05/21/16 AUTHORIZATION [employer]  AMB Provider Completed Forms  05/24/16 PACEMAKER/ICD FORM [facility] [place]  AMB Correspondence  05/23/16 IMPLANT CARD [employer]  Advance Directives/Living Will/HCPOA/POA  05/30/16   Documents for the Encounter  AOB (Assignment of Insurance Benefits) Not Received    E-signature AOB Signed 07/08/16   MEDICARE RIGHTS Not Received    E-signature Medicare Rights Signed 07/08/16   ED Patient Billing Extract   ED PB Summary   ED Patient Billing Extract   ED Encounter Summary  EKG (Deleted) 07/09/16   EKG  07/09/16   Admission Information   Attending Provider Admitting Provider Admission Type Admission Date/Time  Rexene Alberts, MD Rexene Alberts, MD Emergency 07/08/16 [phone]  Discharge Date Hospital Service Auth/Cert Status Service Area   Internal Medicine Incomplete [place]  Unit Room/Bed Admission Status   AP-DEPT 300 A311/A311-01 Admission (Confirmed)   Admission   Complaint  cough fever  Hospital Account   Name Acct ID Class Status Primary Coverage  George Pollard, George Pollard [id number] Inpatient [place]      Guarantor Account (for Hospital Account [id number])   Name Relation to [place]? Acct Type  George Pollard Self CHSA Yes Personal/Family  Address Phone    [place], [place] [address] [phone])        Coverage Information (for Hospital Account [id number])   F/O Payor/Plan Precert #  [facility] #  George Pollard, George Pollard [id number]  Address [place] [address] [phone]

## 2016-07-09 NOTE — Discharge Summary (Signed)
Physician Discharge Summary  George Pollard:[id number] DOB: 1933/06/25 DOA: 07/08/2016  PCP: Monico Blitz, MD  Admit date: 07/08/2016 Discharge date: 07/09/2016  Time spent: greater than 30 minutes  Recommendations for Outpatient Follow-up:  1. Patient was discharged with in home hospice   Discharge Diagnoses:    Transition to comfort care   HCAP (healthcare-associated pneumonia)   Sepsis ([place])   UTI (urinary tract infection), bacterial   Cardiomyopathy, ischemic   Chronic atrial fibrillation (HCC)   Ventricular tachycardia ([place])   Pancoast tumor of right lung ([place])   Paraspinal mass   Antineoplastic chemotherapy induced pancytopenia ([place])   Acute kidney injury ([place])   Bony metastasis ([place])   Diabetes mellitus with neuropathy ([place])   Palliative care encounter   Discharge Condition: terminally ill  Diet recommendation: as tolerated  Filed Weights   07/08/16 0941 07/08/16 1555  Weight: 90.7 kg (200 lb) 94.3 kg (208 lb)    History of present illness:   George Pollard is a 82 y.o. 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 He was just discharged from [facility] 2 days ago for evaluation of lower extremity weakness. CT myelogram revealed direct tumor invasion of the right side of the T1, T2, and T3 vertebral bodies, but no block or cord compression. Radiation oncology was consulted and the patient underwent radiation therapy. Following discharge, he has had progressive generalized weakness, subjective fever and chills, a productive cough with green sputum, and generalized weakness. His appetite has been poor and has been sleeping all day.  In the ED, the patient was relatively hypotensive with a systolic blood pressure in the 80s  to 90s; tachycardic; and afebrile. His EKG revealed atrial fibrillation and wide complex tachycardia with a heart rate of 121 bpm. His chest x-ray revealed airspace consolidation throughout the left mid and upper lung zones; new from one week prior. His lab data were significant for a WBC of 3.4, hemoglobin 9.4, platelet count of 96, lactic acid of 3.98. His urinalysis revealed many bacteria and too numerous to count WBCs. The plan was to transfer the patient to [facility] for ongoing radiation therapy. This was recommended by his oncologist, Dr. Whitney Muse who communicated this to Dr. Roderic Palau who then communicated it to the dictating physician. When I went back to discuss the disposition with the family, apparently, the patient became minimally responsive. They desired to keep him at [facility] to be placed on comfort care as they felt he was actively dying and they I not want him to suffer.   Hospital Course:  This is an 82 year old man with metastatic squmaous cell carcinoma of the lung/Pancoast tumor; recent diagnosis of metastasis to the thoracic vertebrae; ischemic cardiomyopathy with an EF of 30-35%; chronic atrial fibrillation and diabetes with neuropathy. He was recently started on radiation therapy 4 days ago at [facility] for evidence of thoracic vertebrae metastasis. He is status post multiple rounds of chemotherapy. He presented with clinical deterioration at home and evidence of sepsis secondary to HCAP and an urinary tract infection. He was bolused IV fluids and given vancomycin and Zosyn, and oxygen. The plan was to transfer him to the stepdown unit at [facility] for treatment of pneumonia and for ongoing XRT. However, the patient continued to deteriorate in the ED with hypotension  and altered mental status. DO NOT RESUSCITATE status was confirmed. This supportive family thought that the patient appeared to be actively dying and requested that the transfer be canceled and the  he remain at [facility] for comfort care. I agreed as his prognosis was hours to days.   Patient was admitted to a medical bed. All medications not conducive to comfort were discontinued. His fentanyl patch was continued. As needed morphine and oxygen were given. PMT, Ms.Hulan Fray was consulted and met with the family to give support and to discuss goals of care and hospice. All were in agreement for the patient to be discharged to home with in home hospice services. He appeared comfortable at the time of discharge.    Procedures:  none  Consultations:  Palliative Care  Discharge Exam: Vitals:   07/08/16 1555 07/09/16 0610  BP:  109/62  Pulse:  (!) 113  Resp: 20 20  Temp:  (!) 100.5 F (38.1 C)    General: terminally ill appearing 82 yo man in no distress. Cardiovascular: irregular, irregular Respiratory: few scattered anterior crackles  Discharge Instructions   Discharge Instructions    Diet - low sodium heart healthy    Complete by:  As directed   Discharge instructions    Complete by:  As directed   [place].   Increase activity slowly    Complete by:  As directed     Current Discharge Medication List    START taking these medications   Details  morphine (ROXANOL) 20 MG/ML concentrated solution Take 0.25 mLs (5 mg total) by mouth every 2 (two) hours as needed for severe pain. Qty: 15 mL, Refills: 0      CONTINUE these medications which have NOT CHANGED   Details  fentaNYL (DURAGESIC - DOSED MCG/HR) 25 MCG/HR patch Place 25 mcg onto the skin every 3 (three) days.    fentaNYL (DURAGESIC - DOSED MCG/HR) 50 MCG/HR Place 1 patch (50 mcg total) onto the skin every 3 (three) days. Qty: 10 patch, Refills: 0   Associated Diagnoses: Pancoast tumor of right lung ([place]); Neuropathy associated with malignant neoplasm (HCC)      STOP taking these medications     amiodarone (PACERONE) 200 MG tablet      atorvastatin (LIPITOR) 80 MG tablet       CARBOPLATIN IV      feeding supplement (BOOST HIGH PROTEIN) LIQD      furosemide (LASIX) 40 MG tablet      gabapentin (NEURONTIN) 300 MG capsule      HYDROcodone-acetaminophen (NORCO) 10-325 MG tablet      lisinopril (PRINIVIL,ZESTRIL) 2.5 MG tablet      metFORMIN (GLUCOPHAGE) 500 MG tablet      Metoprolol Tartrate (LOPRESSOR) 50 MG tablet      nitroGLYCERIN (NITROSTAT) 0.4 MG SL tablet      ondansetron (ZOFRAN) 8 MG tablet      Potassium Bicarb-Citric Acid 20 MEQ TBEF      prochlorperazine (COMPAZINE) 10 MG tablet      sucralfate (CARAFATE) 1 g tablet      warfarin (COUMADIN) 5 MG tablet      aspirin EC 81 MG tablet      Heparin Lock Flush (HEPARIN FLUSH, PORCINE,) 100 UNIT/ML injection      PACLitaxel (TAXOL IV)      Sodium Chloride Flush (NORMAL SALINE FLUSH) 0.9 % SOLN      tamsulosin (FLOMAX) 0.4 MG CAPS capsule  No Known Allergies Follow-up Information    Tyler Pita, MD .   Specialty:  Radiation Oncology Contact information: [place] [address] [phone]            The results of significant diagnostics from this hospitalization (including imaging, microbiology, ancillary and laboratory) are listed below for reference.    Significant Diagnostic Studies: Ct Abdomen Pelvis Wo Contrast  Result Date: 07/01/2016 CLINICAL DATA:  Lower extremity weakness. Cancer patient receiving chemotherapy and radiation therapy. EXAM: CT ABDOMEN WITHOUT CONTRAST CT PELVIS WITHOUt CONTRAST CT THORACIC SPINE CT LUMBAR SPINE TECHNIQUE: Multidetector CT imaging of the abdomen was performed using the standard protocol. Multidetector CT imaging of the pelvis was performed following the standard protocol without intravenous contrast. Using the CT data thoracic and lumbar spine examinations or degenerated. COMPARISON:  CT SCAN 04/29/2016 FINDINGS: Lower chest: Streaky bibasilar atelectasis, right greater than left. The heart is normal in  size for age. No pericardial effusion. Pacer wires are noted. Coronary artery calcifications. Hepatobiliary: No focal hepatic lesions or intrahepatic biliary dilatation. Layering small gallstones nerve gallbladder. No common bile duct dilatation. Pancreas: No mass, inflammation or ductal dilatation. Spleen: Normal size.  No focal lesions. Adrenals/Urinary Tract: Bilateral adrenal gland metastasis are again demonstrated. Renal cysts are noted but no worrisome renal lesions or obstructing ureteral calculi. Chronic right UPJ obstruction. The bladder is decompressed by Foley catheter. Stomach/Bowel: The stomach, duodenum, small bowel and colon are grossly normal. No inflammatory changes, mass lesions or obstructive findings. The terminal ileum is normal. The appendix is normal. Vascular/Lymphatic: Advanced atherosclerotic calcifications involving the aorta and branch vessels. No mesenteric or retroperitoneal mass or lymphadenopathy. Other: No pelvic mass or adenopathy. No pre pelvic fluid collections. No inguinal mass or adenopathy. Musculoskeletal: No significant bony findings. THORACIC SPINE: Large right apical lung mass invading the chest wall and directly involving the first right and second ribs which are destroyed. There is also tumor destroying the right aspect of T1, T2 and T3. Pathologic compression fracture of T2. Mild canal compromise is also suspected but MR is recommended for further evaluation. A left upper lobe lung lesion is noted along with severe emphysematous changes. Advanced atherosclerotic calcifications involving the aorta but no aneurysm. Lumbar spine: The advanced degenerative lumbar spondylosis with multilevel disc disease and facet disease. T12 compression fracture is noted. No lumbar compression fractures. No obvious destructive bone metastasis. Multilevel disc disease and facet disease. IMPRESSION: 1. Large right apical/paraspinal mass directly invading the T1, T2 and T3 vertebral bodies on  the right side and also destroying the right first and second ribs. There is canal encroachment at T2 but further evaluation with thoracic spine MRI without and with contrast suggested. 2. Bibasilar atelectasis, lung lesions and severe emphysema. 3. Bilateral adrenal gland metastasis. 4. Advanced atherosclerotic calcifications involving the aorta iliac arteries. 5. Remote compression fracture of T12. 6. No acute abdominal/pelvic findings. Electronically Signed   By: Marijo Sanes M.D.   On: 07/01/2016 16:08   Ct Head Wo Contrast  Result Date: 07/01/2016 CLINICAL DATA:  Lower extremity weakness. Cancer patient receiving chemotherapy and radiation therapy. History of lung cancer. EXAM: CT HEAD WITHOUT CONTRAST TECHNIQUE: Contiguous axial images were obtained from the base of the skull through the vertex without intravenous contrast. COMPARISON:  None. FINDINGS: Brain: There is mild generalized age related parenchymal atrophy with commensurate dilatation of the ventricles and sulci. Mild chronic small vessel ischemic changes noted in the bilateral periventricular white matter regions. There is no mass, hemorrhage,  edema or other evidence of acute parenchymal abnormality. No extra-axial hemorrhage. Vascular: No hyperdense vessel or unexpected calcification. There are chronic calcified atherosclerotic changes of the large vessels at the skull base. Skull: Negative for fracture or focal lesion. Sinuses/Orbits: No acute findings. Other: None. IMPRESSION: 1. No acute intracranial findings. No intracranial mass, hemorrhage or edema. 2. Chronic ischemic changes in the white matter. Electronically Signed   By: Franki Cabot M.D.   On: 07/01/2016 19:21   Ct Thoracic Spine Wo Contrast  Result Date: 07/02/2016 CLINICAL DATA:  Metastatic disease to the upper thoracic spine. Assess for canal compromise FLUOROSCOPY TIME:  The 1 minutes 0 seconds. 578.84 micro gray meter squared PROCEDURE: LUMBAR PUNCTURE FOR THORACIC  MYELOGRAM After thorough discussion of risks and benefits of the procedure including bleeding, infection, injury to nerves, blood vessels, adjacent structures as well as headache and CSF leak, written and oral informed consent was obtained. Consent was obtained by Dr. Nelson Chimes. Patient was positioned prone on the fluoroscopy table. Local anesthesia was provided with 1% lidocaine without epinephrine after prepped and draped in the usual sterile fashion. Puncture was performed at left L4-5 using a 3 1/2 inch 22-gauge spinal needle via left para median approach. Using a single pass through the dura, the needle was placed within the thecal sac, with return of clear CSF. 10 mL of Omnipaque-300 was injected into the thecal sac, with normal opacification of the nerve roots and cauda equina consistent with free flow within the subarachnoid space. The patient was then moved to the trendelenburg position and contrast flowed into the Thoracic spine region. I personally performed the lumbar puncture and administered the intrathecal contrast. I also personally performed acquisition of the myelogram images. TECHNIQUE: Contiguous axial images were obtained through the Thoracic spine after the intrathecal infusion of infusion. Coronal and sagittal reconstructions were obtained of the axial image sets. FINDINGS: THORACIC MYELOGRAM FINDINGS: Limited filming due to limited upper thoracic opacity. Patient has prominent spondylosis at the thoracolumbar junction region with old partial compression fractures and prominent anterior extradural defects. See results of the CT scan below. CT THORACIC MYELOGRAM FINDINGS: The patient was scanned initially supine and then in the decubitus position to get better contrast opacity in the upper thoracic region in this kyphotic individual. As previously seen, there is tumor involvement of the right side of the T1, T2 and T3 vertebral bodies due to direct invasion from the upper medial chest lesion.  Tumor involves the intervertebral foramen on the right at T1-2 and T2-3. The T3-4 foramen is not compromised. There is a small amount of ventral epidural tumor at the T1 and T2 level, but this does not obliterate the subarachnoid space or have any compressive effect upon the cord at this moment. Aside from the vertebral body, there is involvement of the transverse process and proximal rib at T1 and the proximal rib at T2. No tumor or compressive canal stenosis is seen below that region in the thoracic spine. The patient has pronounced degenerative spondylosis in the lower thoracic and upper lumbar region with indentation of the thecal sac. IMPRESSION: Direct tumor invasion of the right side of the T1, T2 and T3 vertebral bodies. Foraminal involvement on the right at T1-2 and T2-3. Small amount of ventral epidural tumor at T1 and T2 indents the thecal sac mildly but does not result in a block or cord compression. Electronically Signed   By: Nelson Chimes M.D.   On: 07/02/2016 16:45   Ct Thoracic Spine Wo  Contrast  Result Date: 07/01/2016 CLINICAL DATA:  Lower extremity weakness. Cancer patient receiving chemotherapy and radiation therapy. EXAM: CT ABDOMEN WITHOUT CONTRAST CT PELVIS WITHOUt CONTRAST CT THORACIC SPINE CT LUMBAR SPINE TECHNIQUE: Multidetector CT imaging of the abdomen was performed using the standard protocol. Multidetector CT imaging of the pelvis was performed following the standard protocol without intravenous contrast. Using the CT data thoracic and lumbar spine examinations or degenerated. COMPARISON:  CT SCAN 04/29/2016 FINDINGS: Lower chest: Streaky bibasilar atelectasis, right greater than left. The heart is normal in size for age. No pericardial effusion. Pacer wires are noted. Coronary artery calcifications. Hepatobiliary: No focal hepatic lesions or intrahepatic biliary dilatation. Layering small gallstones nerve gallbladder. No common bile duct dilatation. Pancreas: No mass, inflammation  or ductal dilatation. Spleen: Normal size.  No focal lesions. Adrenals/Urinary Tract: Bilateral adrenal gland metastasis are again demonstrated. Renal cysts are noted but no worrisome renal lesions or obstructing ureteral calculi. Chronic right UPJ obstruction. The bladder is decompressed by Foley catheter. Stomach/Bowel: The stomach, duodenum, small bowel and colon are grossly normal. No inflammatory changes, mass lesions or obstructive findings. The terminal ileum is normal. The appendix is normal. Vascular/Lymphatic: Advanced atherosclerotic calcifications involving the aorta and branch vessels. No mesenteric or retroperitoneal mass or lymphadenopathy. Other: No pelvic mass or adenopathy. No pre pelvic fluid collections. No inguinal mass or adenopathy. Musculoskeletal: No significant bony findings. THORACIC SPINE: Large right apical lung mass invading the chest wall and directly involving the first right and second ribs which are destroyed. There is also tumor destroying the right aspect of T1, T2 and T3. Pathologic compression fracture of T2. Mild canal compromise is also suspected but MR is recommended for further evaluation. A left upper lobe lung lesion is noted along with severe emphysematous changes. Advanced atherosclerotic calcifications involving the aorta but no aneurysm. Lumbar spine: The advanced degenerative lumbar spondylosis with multilevel disc disease and facet disease. T12 compression fracture is noted. No lumbar compression fractures. No obvious destructive bone metastasis. Multilevel disc disease and facet disease. IMPRESSION: 1. Large right apical/paraspinal mass directly invading the T1, T2 and T3 vertebral bodies on the right side and also destroying the right first and second ribs. There is canal encroachment at T2 but further evaluation with thoracic spine MRI without and with contrast suggested. 2. Bibasilar atelectasis, lung lesions and severe emphysema. 3. Bilateral adrenal gland  metastasis. 4. Advanced atherosclerotic calcifications involving the aorta iliac arteries. 5. Remote compression fracture of T12. 6. No acute abdominal/pelvic findings. Electronically Signed   By: Marijo Sanes M.D.   On: 07/01/2016 16:08   Ct Lumbar Spine Wo Contrast  Result Date: 07/01/2016 CLINICAL DATA:  Lower extremity weakness. Cancer patient receiving chemotherapy and radiation therapy. EXAM: CT ABDOMEN WITHOUT CONTRAST CT PELVIS WITHOUt CONTRAST CT THORACIC SPINE CT LUMBAR SPINE TECHNIQUE: Multidetector CT imaging of the abdomen was performed using the standard protocol. Multidetector CT imaging of the pelvis was performed following the standard protocol without intravenous contrast. Using the CT data thoracic and lumbar spine examinations or degenerated. COMPARISON:  CT SCAN 04/29/2016 FINDINGS: Lower chest: Streaky bibasilar atelectasis, right greater than left. The heart is normal in size for age. No pericardial effusion. Pacer wires are noted. Coronary artery calcifications. Hepatobiliary: No focal hepatic lesions or intrahepatic biliary dilatation. Layering small gallstones nerve gallbladder. No common bile duct dilatation. Pancreas: No mass, inflammation or ductal dilatation. Spleen: Normal size.  No focal lesions. Adrenals/Urinary Tract: Bilateral adrenal gland metastasis are again demonstrated. Renal cysts are noted but  no worrisome renal lesions or obstructing ureteral calculi. Chronic right UPJ obstruction. The bladder is decompressed by Foley catheter. Stomach/Bowel: The stomach, duodenum, small bowel and colon are grossly normal. No inflammatory changes, mass lesions or obstructive findings. The terminal ileum is normal. The appendix is normal. Vascular/Lymphatic: Advanced atherosclerotic calcifications involving the aorta and branch vessels. No mesenteric or retroperitoneal mass or lymphadenopathy. Other: No pelvic mass or adenopathy. No pre pelvic fluid collections. No inguinal mass or  adenopathy. Musculoskeletal: No significant bony findings. THORACIC SPINE: Large right apical lung mass invading the chest wall and directly involving the first right and second ribs which are destroyed. There is also tumor destroying the right aspect of T1, T2 and T3. Pathologic compression fracture of T2. Mild canal compromise is also suspected but MR is recommended for further evaluation. A left upper lobe lung lesion is noted along with severe emphysematous changes. Advanced atherosclerotic calcifications involving the aorta but no aneurysm. Lumbar spine: The advanced degenerative lumbar spondylosis with multilevel disc disease and facet disease. T12 compression fracture is noted. No lumbar compression fractures. No obvious destructive bone metastasis. Multilevel disc disease and facet disease. IMPRESSION: 1. Large right apical/paraspinal mass directly invading the T1, T2 and T3 vertebral bodies on the right side and also destroying the right first and second ribs. There is canal encroachment at T2 but further evaluation with thoracic spine MRI without and with contrast suggested. 2. Bibasilar atelectasis, lung lesions and severe emphysema. 3. Bilateral adrenal gland metastasis. 4. Advanced atherosclerotic calcifications involving the aorta iliac arteries. 5. Remote compression fracture of T12. 6. No acute abdominal/pelvic findings. Electronically Signed   By: Marijo Sanes M.D.   On: 07/01/2016 16:08   US Renal  Result Date: 07/01/2016 CLINICAL DATA:  Acute renal failure EXAM: RENAL / URINARY TRACT ULTRASOUND COMPLETE COMPARISON:  CT scan July 01, 2016 FINDINGS: Right Kidney: Length: 13.5 cm. There is a large cyst measuring greater than 6 cm in the right kidney, also seen on the recent CT scan. No hydronephrosis. Left Kidney: Length: 12.3 cm. Echogenicity within normal limits. No mass or hydronephrosis visualized. Bladder: The bladder is decompressed with a Foley catheter. IMPRESSION: 1. No hydronephrosis.  Mild increased echogenicity associated with both renal cortices, likely medical renal disease. Electronically Signed   By: Dorise Bullion III M.D   On: 07/01/2016 23:16   Dg Chest Portable 1 View  Result Date: 07/08/2016 CLINICAL DATA:  Hypoxia.  History of lung carcinoma EXAM: PORTABLE CHEST 1 VIEW COMPARISON:  July 01, 2016 FINDINGS: There is airspace consolidation throughout portions of the left upper and mid lung regions, a change from 1 week prior. There is mild interstitial prominence on the right without airspace consolidation. There is cardiomegaly with pulmonary vascularity within normal limits. Patient is status post internal mammary bypass grafting. Pacemaker lead is attached to the right ventricle. There is atherosclerotic calcification aorta. No adenopathy is demonstrable. IMPRESSION: Airspace consolidation throughout the left mid and upper lung zones, new from 1 week prior. Otherwise no appreciable change. Stable cardiomegaly. Aortic atherosclerosis noted. Electronically Signed   By: Lowella Grip III M.D.   On: 07/08/2016 10:51  Dg Chest Portable 1 View  Result Date: 06/12/2016 CLINICAL DATA:  Bilateral lower extremity swelling, abdominal swelling and fatigue. History of lung cancer. EXAM: PORTABLE CHEST 1 VIEW COMPARISON:  05/20/2016. FINDINGS: Stable mild cardiac enlargement. Stable tortuosity and calcification of the thoracic aorta. Stable surgical changes from bypass surgery. The right ventricular pacer wire and right PICC line are stable.  Stable right paratracheal soft tissue density. No acute overlying pulmonary process. No pleural effusions. The bony thorax is intact. IMPRESSION: No acute cardiopulmonary findings. Electronically Signed   By: Marijo Sanes M.D.   On: 06/12/2016 18:15   Dg Abd 2 Views  Result Date: 06/12/2016 CLINICAL DATA:  82 year old male with constipation and bilateral leg swelling. Abdominal pain. EXAM: ABDOMEN - 2 VIEW COMPARISON:  CT of the abdomen pelvis  dated 04/29/2016 FINDINGS: Copious amount of dense stool noted throughout the colon. There is no bowel dilatation or evidence of obstruction. No free air. No radiopaque calculi. There is advanced osteopenia with extensive degenerative changes of the spine and scoliosis. No acute fracture. Multiple surgical clips noted in the upper abdomen. Bibasilar linear atelectasis/ scarring. A small right pleural effusion noted. There is median sternotomy wires and CABG vascular clips. Left pectoral AICD device. IMPRESSION: Constipation.  No bowel obstruction. Electronically Signed   By: Anner Crete M.D.   On: 06/12/2016 19:57   Dg Abd Acute W/chest  Result Date: 07/01/2016 CLINICAL DATA:  Loose stools. EXAM: DG ABDOMEN ACUTE W/ 1V CHEST COMPARISON:  06/12/2016 FINDINGS: The upright chest x-ray demonstrates cardiac enlargement and stable surgical changes from bypass surgery. There is tortuosity and calcification of the thoracic aorta. Right-sided PICC line is in good position with its tip in the mid SVC. The pacer wires well position. No infiltrates or effusions. Two views of the abdomen demonstrate scattered air and stool throughout the colon. There are also air-filled loops of small bowel without distention or air-fluid levels. Moderate aortic and iliac artery calcifications. IMPRESSION: No acute cardiopulmonary findings. Moderate stool throughout the. No findings for small bowel obstruction or free air. Electronically Signed   By: Marijo Sanes M.D.   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 Assess for canal compromise FLUOROSCOPY TIME:  The 1 minutes 0 seconds. 578.84 micro gray meter squared PROCEDURE: LUMBAR PUNCTURE FOR THORACIC MYELOGRAM After thorough discussion of risks and benefits of the procedure including bleeding, infection, injury to nerves, blood vessels, adjacent structures as well as headache  and CSF leak, written and oral informed consent was obtained. Consent was obtained by Dr. Nelson Chimes. Patient was positioned prone on the fluoroscopy table. Local anesthesia was provided with 1% lidocaine without epinephrine after prepped and draped in the usual sterile fashion. Puncture was performed at left L4-5 using a 3 1/2 inch 22-gauge spinal needle via left para median approach. Using a single pass through the dura, the needle was placed within the thecal sac, with return of clear CSF. 10 mL of Omnipaque-300 was injected into the thecal sac, with normal opacification of the nerve roots and cauda equina consistent with free flow within the subarachnoid space. The patient was then moved to the trendelenburg position and contrast flowed into the Thoracic spine region. I personally performed the lumbar puncture and administered the intrathecal contrast. I also personally performed acquisition of the myelogram images. TECHNIQUE: Contiguous axial images were obtained through the Thoracic spine after the intrathecal infusion of infusion. Coronal and sagittal reconstructions were obtained of the axial image sets. FINDINGS: THORACIC MYELOGRAM FINDINGS: Limited filming due to limited upper thoracic opacity. Patient has prominent spondylosis at the thoracolumbar junction region with old partial compression fractures and prominent anterior extradural defects. See results of the CT scan below. CT THORACIC MYELOGRAM FINDINGS: The patient was scanned initially supine and then in the decubitus  position to get better contrast opacity in the upper thoracic region in this kyphotic individual. As previously seen, there is tumor involvement of the right side of the T1, T2 and T3 vertebral bodies due to direct invasion from the upper medial chest lesion. Tumor involves the intervertebral foramen on the right at T1-2 and T2-3. The T3-4 foramen is not compromised. There is a small amount of ventral epidural tumor at the T1 and T2  level, but this does not obliterate the subarachnoid space or have any compressive effect upon the cord at this moment. Aside from the vertebral body, there is involvement of the transverse process and proximal rib at T1 and the proximal rib at T2. No tumor or compressive canal stenosis is seen below that region in the thoracic spine. The patient has pronounced degenerative spondylosis in the lower thoracic and upper lumbar region with indentation of the thecal sac. IMPRESSION: Direct tumor invasion of the right side of the T1, T2 and T3 vertebral bodies. Foraminal involvement on the right at T1-2 and T2-3. Small amount of ventral epidural tumor at T1 and T2 indents the thecal sac mildly but does not result in a block or cord compression. Electronically Signed   By: Nelson Chimes M.D.   On: 07/02/2016 16:45    Microbiology: Recent Results (from the past 240 hour(s))  MRSA PCR Screening     Status: None   Collection Time: 07/01/16 10:41 PM  Result Value Ref Range Status   MRSA by PCR NEGATIVE NEGATIVE Final    Comment:        The GeneXpert MRSA Assay (FDA approved for NASAL specimens only), is one component of a comprehensive MRSA colonization surveillance program. It is not intended to diagnose MRSA infection nor to guide or monitor treatment for MRSA infections.   Urine culture     Status: Abnormal   Collection Time: 07/08/16 10:02 AM  Result Value Ref Range Status   Specimen Description URINE, CLEAN CATCH  Final   Special Requests NONE  Final   Culture MULTIPLE SPECIES PRESENT, SUGGEST RECOLLECTION (A)  Final   Report Status 07/09/2016 FINAL  Final  Culture, blood (Routine x 2)     Status: None (Preliminary result)   Collection Time: 07/08/16 10:15 AM  Result Value Ref Range Status   Specimen Description BLOOD PICC LINE DRAWN BY RN RK  Final   Special Requests   Final    BOTTLES DRAWN AEROBIC AND ANAEROBIC AEB=7CC ANA=5CC   Culture  Setup Time   Final    GRAM NEGATIVE  COCCOBACILLI Gram Stain Report Called to,Read Back By and Verified With: LAVINDER,J AT 0635 BY HUFFINES,S ON 07/09/16 Performed at [place], READ BACK BY AND VERIFIED WITH: L. POOLE, PHARD, [facility], AT 1425 ON 07/09/16 BY C. JESSUP, MLT. Performed at [facility]    Culture NO GROWTH 1 DAY  Final   Report Status PENDING  Incomplete  Culture, blood (Routine x 2)     Status: None (Preliminary result)   Collection Time: 07/08/16 10:20 AM  Result Value Ref Range Status   Specimen Description BLOOD LEFT HAND  Final   Special Requests BOTTLES DRAWN AEROBIC AND ANAEROBIC 5CC EACH  Final   Culture  Setup Time   Final    GRAM NEGATIVE RODS IN BOTH AEROBIC AND ANAEROBIC BOTTLES Gram Stain Report Called to,Read Back By and Verified With: LAVINDER,J AT 0635 BY HUFFINES,S ON 07/09/2016 Performed at [place]  CALLED TO, READ BACK BY AND VERIFIED WITH: L. POOLE, PHARMD, [facility], AT 1425 ON 07/09/16 BY C. JESSUP, MLT. Performed at [place]  Final   Report Status PENDING  Incomplete  Blood Culture ID Panel (Reflexed)     Status: None   Collection Time: 07/08/16 10:20 AM  Result Value Ref Range Status   Enterococcus species NOT DETECTED NOT DETECTED Final   Vancomycin resistance NOT DETECTED NOT DETECTED Final   Listeria monocytogenes NOT DETECTED NOT DETECTED Final   Staphylococcus species NOT DETECTED NOT DETECTED Final   Staphylococcus aureus NOT DETECTED NOT DETECTED Final   Methicillin resistance NOT DETECTED NOT DETECTED Final   Streptococcus species NOT DETECTED NOT DETECTED Final   Streptococcus agalactiae NOT DETECTED NOT DETECTED Final   Streptococcus pneumoniae NOT DETECTED NOT DETECTED Final   Streptococcus pyogenes NOT DETECTED NOT DETECTED Final   Acinetobacter baumannii NOT DETECTED NOT DETECTED Final   Enterobacteriaceae species NOT DETECTED NOT DETECTED Final   Enterobacter cloacae  complex NOT DETECTED NOT DETECTED Final   Escherichia coli NOT DETECTED NOT DETECTED Final   Klebsiella oxytoca NOT DETECTED NOT DETECTED Final   Klebsiella pneumoniae NOT DETECTED NOT DETECTED Final   Proteus species NOT DETECTED NOT DETECTED Final   Serratia marcescens NOT DETECTED NOT DETECTED Final   Carbapenem resistance NOT DETECTED NOT DETECTED Final   Haemophilus influenzae NOT DETECTED NOT DETECTED Final   Neisseria meningitidis NOT DETECTED NOT DETECTED Final   Pseudomonas aeruginosa NOT DETECTED NOT DETECTED Final   Candida albicans NOT DETECTED NOT DETECTED Final   Candida glabrata NOT DETECTED NOT DETECTED Final   Candida krusei NOT DETECTED NOT DETECTED Final   Candida parapsilosis NOT DETECTED NOT DETECTED Final   Candida tropicalis NOT DETECTED NOT DETECTED Final    Comment: Performed at [place]: Basic Metabolic Panel:  Recent Labs Lab 07/04/16 0435 07/08/16 1014  NA 137 140  K 4.2 4.0  CL 104 109  CO2 28 24  GLUCOSE 200* 287*  BUN 27* 30*  CREATININE 1.08 1.31*  CALCIUM 8.9 8.0*   Liver Function Tests:  Recent Labs Lab 07/08/16 1014  AST 76*  ALT 101*  ALKPHOS 45  BILITOT 2.6*  PROT 4.5*  ALBUMIN 1.9*   No results for input(s): LIPASE, AMYLASE in the last 168 hours. No results for input(s): AMMONIA in the last 168 hours. CBC:  Recent Labs Lab 07/04/16 0435 07/08/16 1014  WBC 4.0 3.4*  NEUTROABS  --  3.0  HGB 8.6* 9.4*  HCT 26.9* 29.4*  MCV 91.8 94.8  PLT 116* 96*   Cardiac Enzymes: No results for input(s): CKTOTAL, CKMB, CKMBINDEX, TROPONINI in the last 168 hours. BNP: BNP (last 3 results)  Recent Labs  02/28/16 0215 06/12/16 1845 07/01/16 1250  BNP 242.2* 224.0* 348.0*    ProBNP (last 3 results) No results for input(s): PROBNP in the last 8760 hours.  CBG:  Recent Labs Lab 07/05/16 1701 07/05/16 2036 07/06/16 0029 07/06/16 0438 07/06/16 0757  GLUCAP 227* 243* 220* 187* 162*        Signed:  Ledia Hanford MD.  [facility] 07/09/2016, 7:07 PM

## 2016-07-09 NOTE — Care Management Note (Signed)
Case Management Note  Patient Details  Name: George Pollard MRN: [id number] Date of Birth: [birth year]  Subjective/Objective:                  Pt admitted with weakness and metastatic ca. Pt is from home, lives with wife and was active with [facility] for nursing services prior to admission. Pt's family has chosen to take him home with hospice services. Family has chosen [facility] from list of providers. Olean Ree, from [place] provided pt info and referral. Pt ordered hospital bed, oxygen and suction through [employer]. DME will be delivered to home and then EMS transport will be arranged by CM. Romualdo Bolk, of [facility], made aware of pt status and DC plan.   Action/Plan: Discharging home today with Hospice services in the home.   Expected Discharge Date:  07/10/16               Expected Discharge Plan:  Home w Hospice Care  In-House Referral:  Hospice / Palliative Care  Discharge planning Services  CM Consult  Post Acute Care Choice:  Hospice Choice offered to:  Spouse  DME Arranged:    DME Agency:  Other - Comment ([facility])  HH Arranged:    HH Agency:     Status of Service:  Completed, signed off  If discussed at [place] of Stay Meetings, dates discussed:    Additional Comments:  Sherald Barge, RN 07/09/2016, 2:16 PM

## 2016-07-10 ENCOUNTER — Ambulatory Visit: Payer: Medicare Other

## 2016-07-11 ENCOUNTER — Ambulatory Visit: Payer: Medicare Other

## 2016-07-11 LAB — CULTURE, BLOOD (ROUTINE X 2)

## 2016-07-12 ENCOUNTER — Ambulatory Visit: Payer: Medicare Other

## 2016-07-12 ENCOUNTER — Telehealth: Payer: Self-pay | Admitting: Cardiology

## 2016-07-12 NOTE — Telephone Encounter (Signed)
Signed order faxed back to [facility].   LMTCB to make sure fax was received/sss

## 2016-07-12 NOTE — Telephone Encounter (Signed)
Liliane Channel w/ [facility] of [facility] called and stated that pt thought his ICD therapies were turned off. I investigated pt chart and read through MD notes from visit on 04-05-16 and seen no mention where ICD therapies had been turned off. instructed Liliane Channel to fax an order for MD to sign and that someone would be in touch with him. Rick verbalized understanding.

## 2016-07-12 NOTE — Telephone Encounter (Signed)
Order and patient demographics sent to [facility] w/[employer].    Wife aware.sss

## 2016-07-14 ENCOUNTER — Encounter (HOSPITAL_COMMUNITY): Payer: Self-pay | Admitting: Hematology & Oncology

## 2016-07-15 ENCOUNTER — Ambulatory Visit: Payer: Medicare Other

## 2016-07-16 DEATH — deceased

## 2016-07-21 NOTE — Progress Notes (Signed)
  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  He was initially hospitalized at diagnosis, and was re-admitted on 7/24 with multiple issues.  Radiation was discontinued at that point and the patient entered hospice care. ________________________________  Sheral Apley. Tammi Klippel, M.D.

## 2016-07-29 ENCOUNTER — Encounter: Payer: Medicare Other | Admitting: Internal Medicine

## 2016-11-23 ENCOUNTER — Other Ambulatory Visit: Payer: Self-pay | Admitting: Nurse Practitioner

## 2017-11-02 IMAGING — CT CT CHEST W/ CM
2 of 5 series · 12 of 36 positions shown, 15 images · IV contrast (Omnipaque 300)
Comparison: CTA, 11/21/2015

CLINICAL DATA: Pancoast tumor of right lung (HCC) history of
aneurysm repair.

EXAM:
CT CHEST, ABDOMEN, AND PELVIS WITH CONTRAST
TECHNIQUE: Multidetector CT imaging of the chest, abdomen and pelvis was
performed following the standard protocol during bolus
administration of intravenous contrast.
CONTRAST:  100mL ZIBZZO-BTT IOPAMIDOL (ZIBZZO-BTT) INJECTION 61%

[Series 2: cap with 5.0 b40f · axial · 0.77mm/px · z∈[-615,-100]mm · 9 of 129 slices shown, 12 images]
[im 13/129  mediastinal]
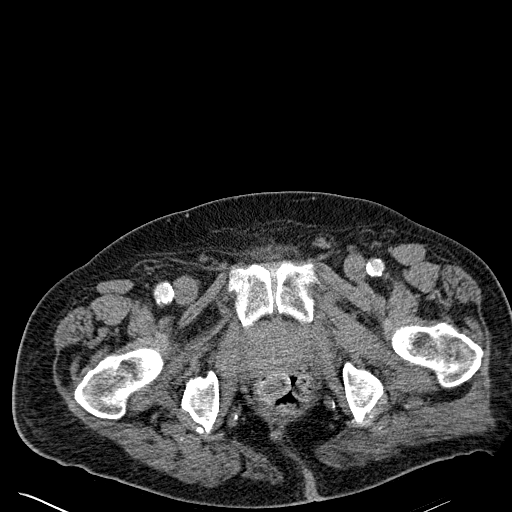
[im 13/129  lung]
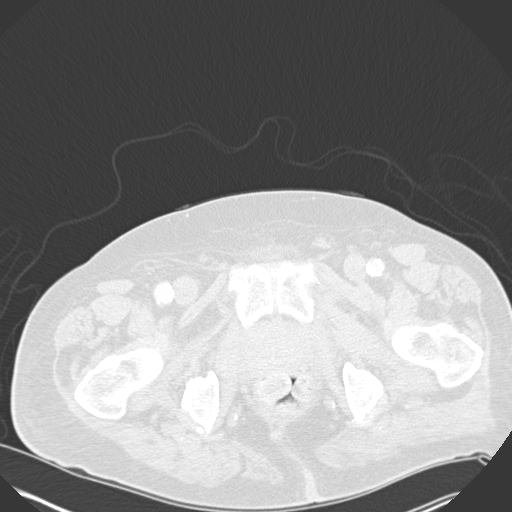
[im 26/129  lung]
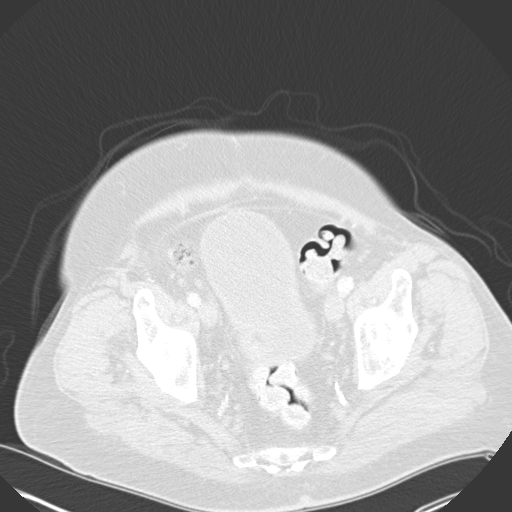
[im 39/129  lung]
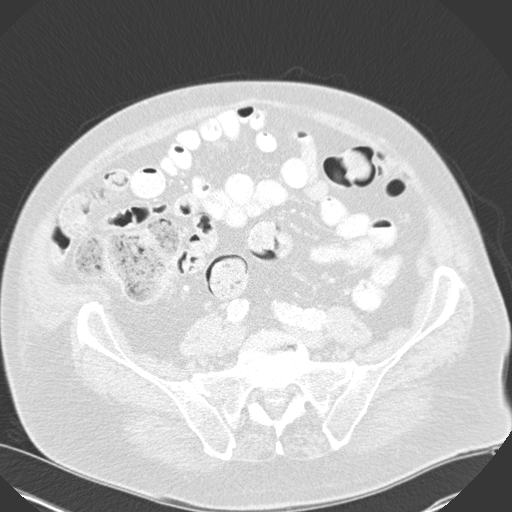
[im 52/129  lung]
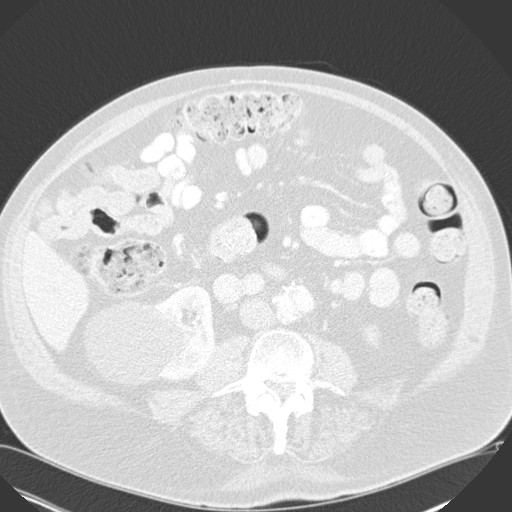
[im 65/129  mediastinal]
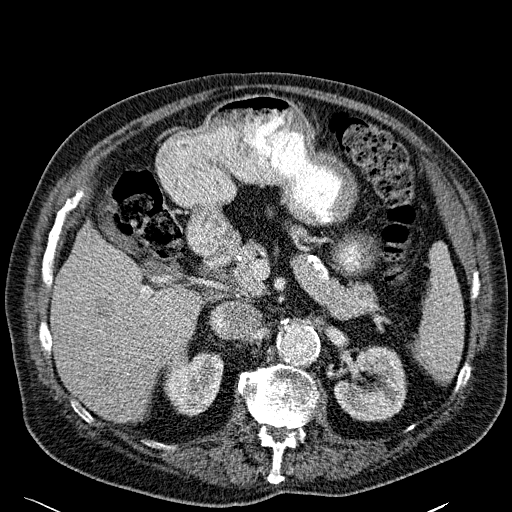
[im 65/129  lung]
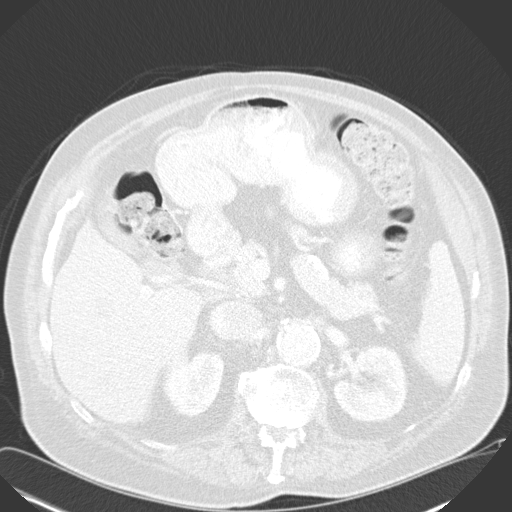
[im 77/129  lung]
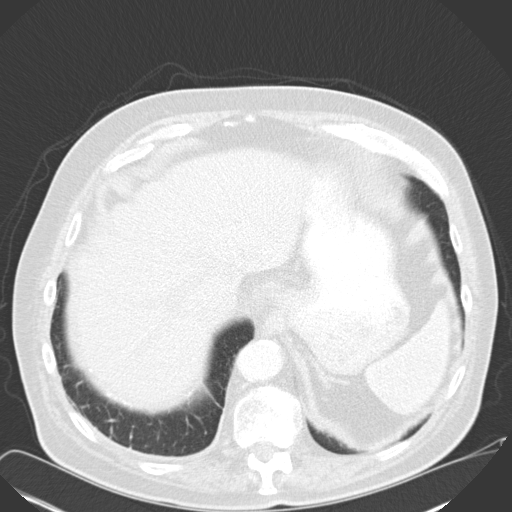
[im 90/129  lung]
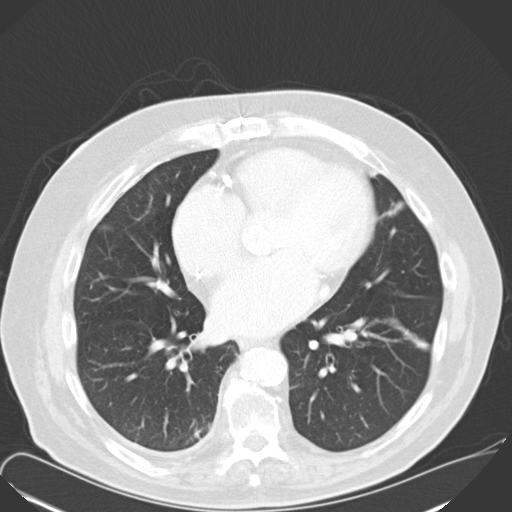
[im 103/129  lung]
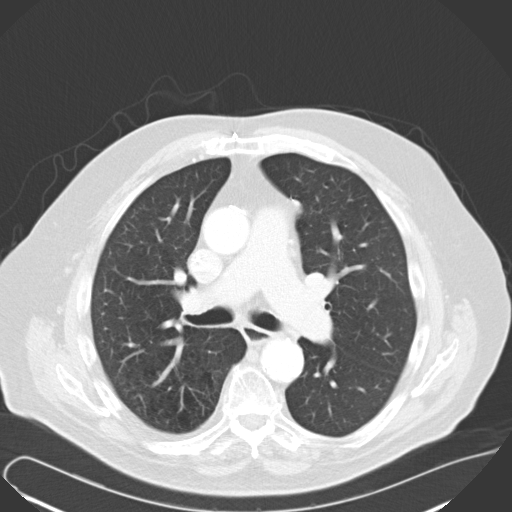
[im 116/129  mediastinal]
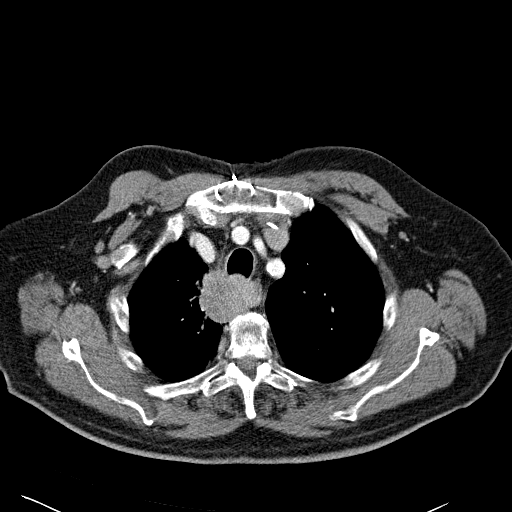
[im 116/129  lung]
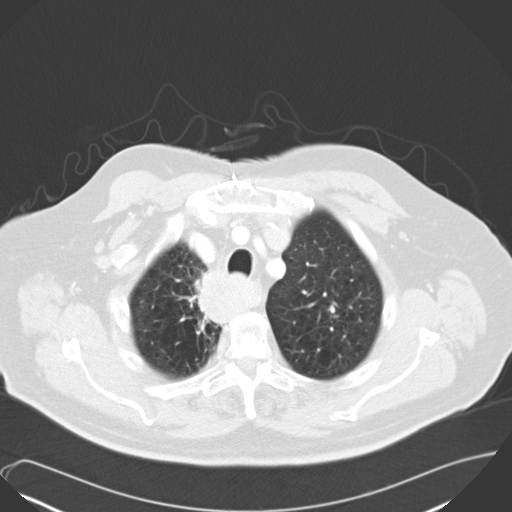

[Series 3: mpr cor post contrast 2.0mm · coronal · 0.83mm/px · 3 of 172 slices shown]
[im 35/172  lung]
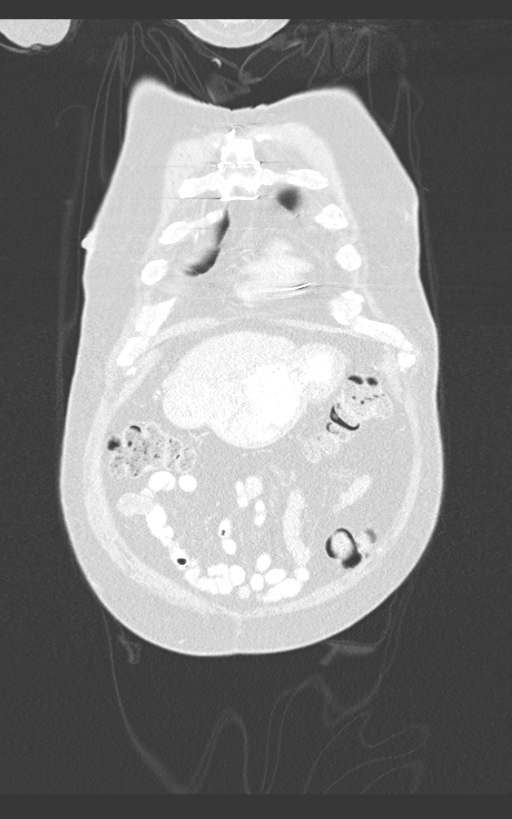
[im 69/172  lung]
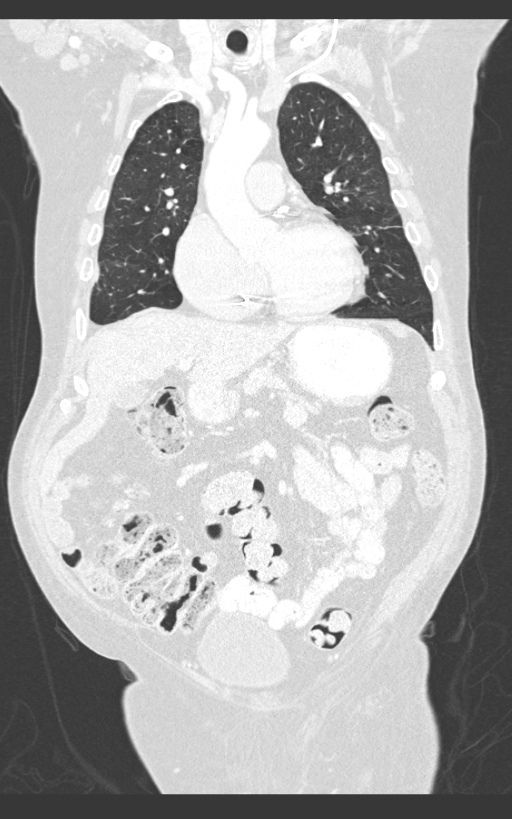
[im 103/172  lung]
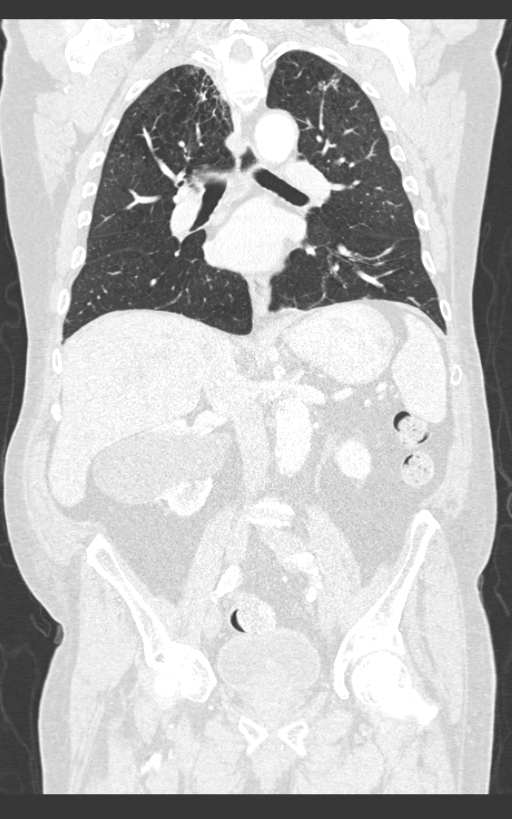

[12 of 36 positions shown; findings below may reference images not displayed]

FINDINGS: CT CHEST

Neck base and axilla: There is abnormal soft tissue with associated
bone resorption on the right at the cervical thoracic junction
involving C7, T1 and T2. The soft tissue component at the cervical
thoracic junction on the right contiguous with the involved
vertebra, measures 3.4 x 1.6 cm. No other neck base abnormalities.

Mediastinum and hila: Right posterior mediastinal mass, which may
reflect a right upper lobe mass invading the mediastinum, measures
6.1 cm superior inferior by 4.7 x 4.9 cm transversely. The mass is
contiguous with the right margin of the esophagus, which is
displaced to the left. It is also contiguous with the posterior
aspect of the trachea that has a mild anterior convex bulge. The
mass is contiguous with the posterior margin of the central right
subclavian artery.

Lungs and pleura: There is irregular ground-glass opacity in a
peribronchovascular distribution in the left upper lobe, centered on
image 29, series 6, measuring 16 mm greatest transverse dimension.
There is a solid lobulated nodule in the left upper lobe, image 73,
measuring 11 mm. Reticular type opacity is seen adjacent to the
right medial upper lobe/mediastinal mass. There is linear/discoid
type opacity in the lower lungs most consistent with atelectasis/
scarring. Mild to moderate changes of centrilobular emphysema are
noted most evident in the upper lobes. No pleural effusion. No
pneumothorax.

There are changes from previous CABG surgery.

CT ABDOMEN AND PELVIS

Hepatobiliary: No liver mass or lesion. Small dependent gallstones.
Gallbladder otherwise unremarkable. No bile duct dilation.

Spleen and pancreas:  Unremarkable.

Adrenal glands: Bilateral adrenal masses, larger on the right
measuring 2.5 cm, similar to the prior CT.

Kidneys, ureters, bladder: 11 cm midpole right renal cyst without
change from the prior study. No other renal masses. No
hydronephrosis. Ureters are normal in course and in caliber. Bladder
is unremarkable.

Prostate gland:  Enlarged but stable.

Lymph nodes:  No adenopathy.

Vascular: Diffuse aortic ectasia with atherosclerotic
calcifications. No change from the prior study.

Ascites:  None.

Gastrointestinal:  Unremarkable.  Normal appendix visualized.

MUSCULOSKELETAL

As described on the chest, there is resorption of portions of the
right C7, T1 and T2 vertebrae via the above described mass. No other
osteolytic lesions. There is a stable moderate compression fracture
of T12.
IMPRESSION: 1. Pancoast tumor extending from the medial right upper lobe into
the right posterior superior mediastinum. Mass measures 6.1 x 4.7 x
4.9 cm. Mass abuts and may invade the esophagus and posterior
trachea. Mass abuts the central right subclavian artery. The mass
causes erosion of the right anterior aspects of the C7, T1 and T2
vertebra.
2. There is an 11 mm nodule in the left upper lobe consistent with
contralateral metastatic disease. A more ground-glass type area of
opacity in the left upper lobe is noted that is likely scarring. No
other convincing lung metastatic disease or metastatic disease in
the chest.
3. Bilateral adrenal masses are similar to the prior CT which
suggests bilateral adenomas. Metastatic disease is not excluded but
felt less likely. No other evidence of metastatic disease in the
abdomen or pelvis.

## 2017-12-16 IMAGING — DX DG ABDOMEN 2V
3 series · 3 of 3 positions shown · non-contrast
Comparison: CT of the abdomen pelvis dated 04/29/2016

CLINICAL DATA: 82-year-old male with constipation and bilateral leg
swelling. Abdominal pain.

EXAM:
ABDOMEN - 2 VIEW

[abdomen erect]
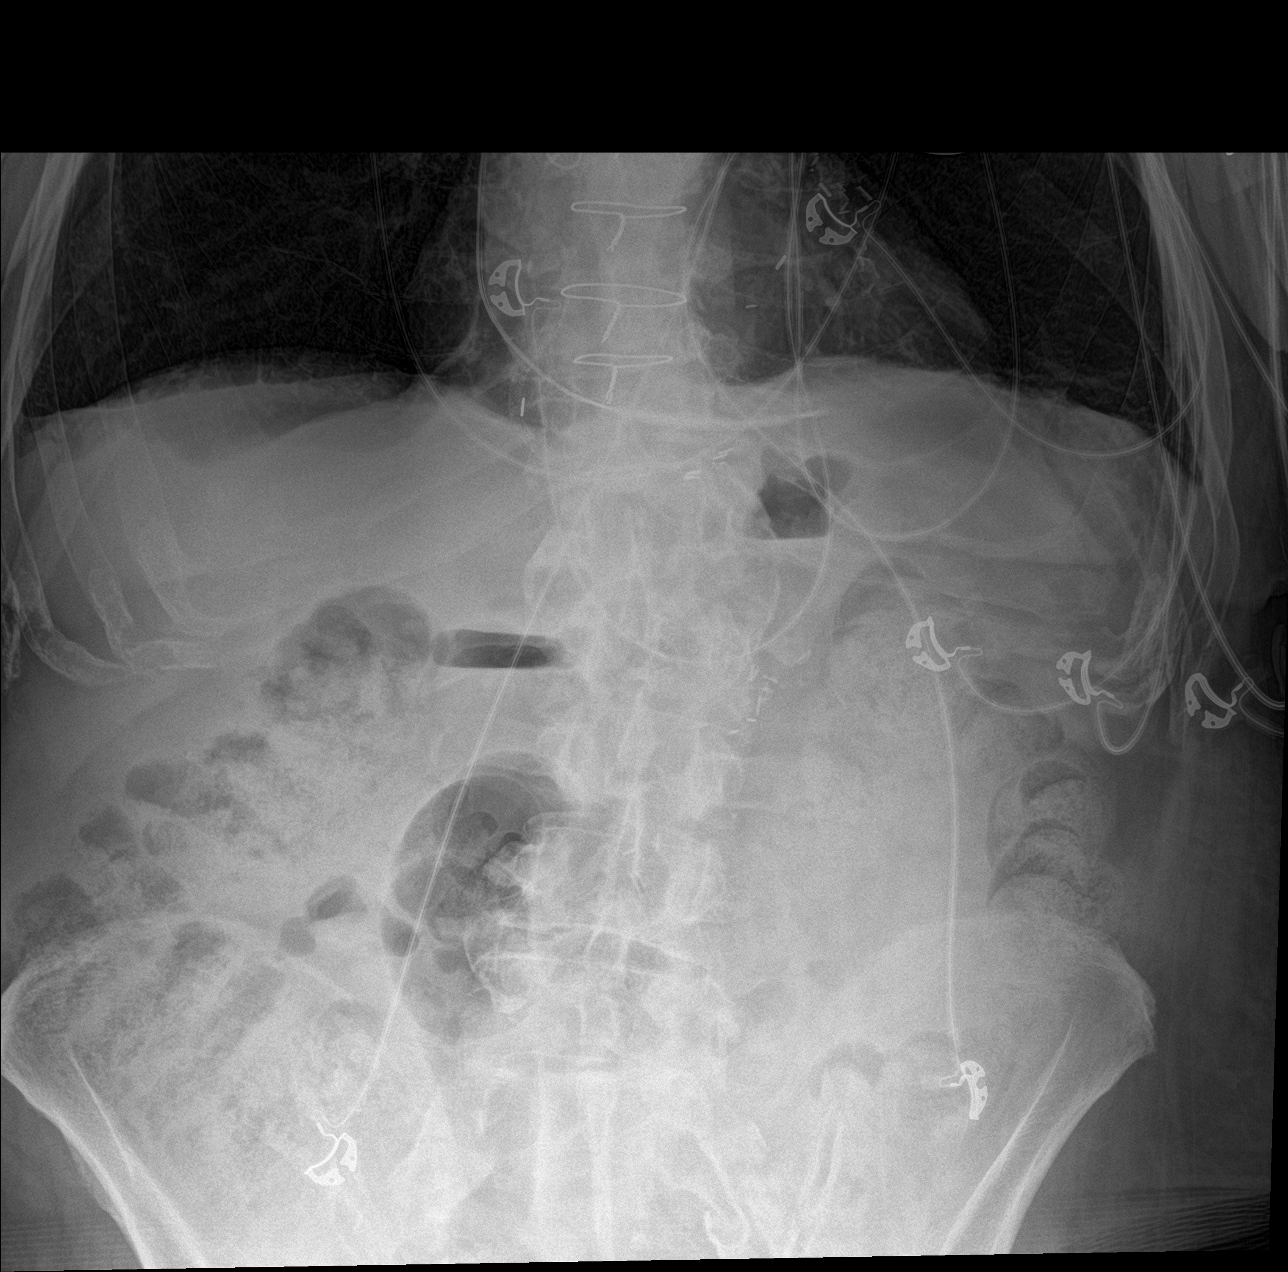

[abdomen supine (1 of 2)]
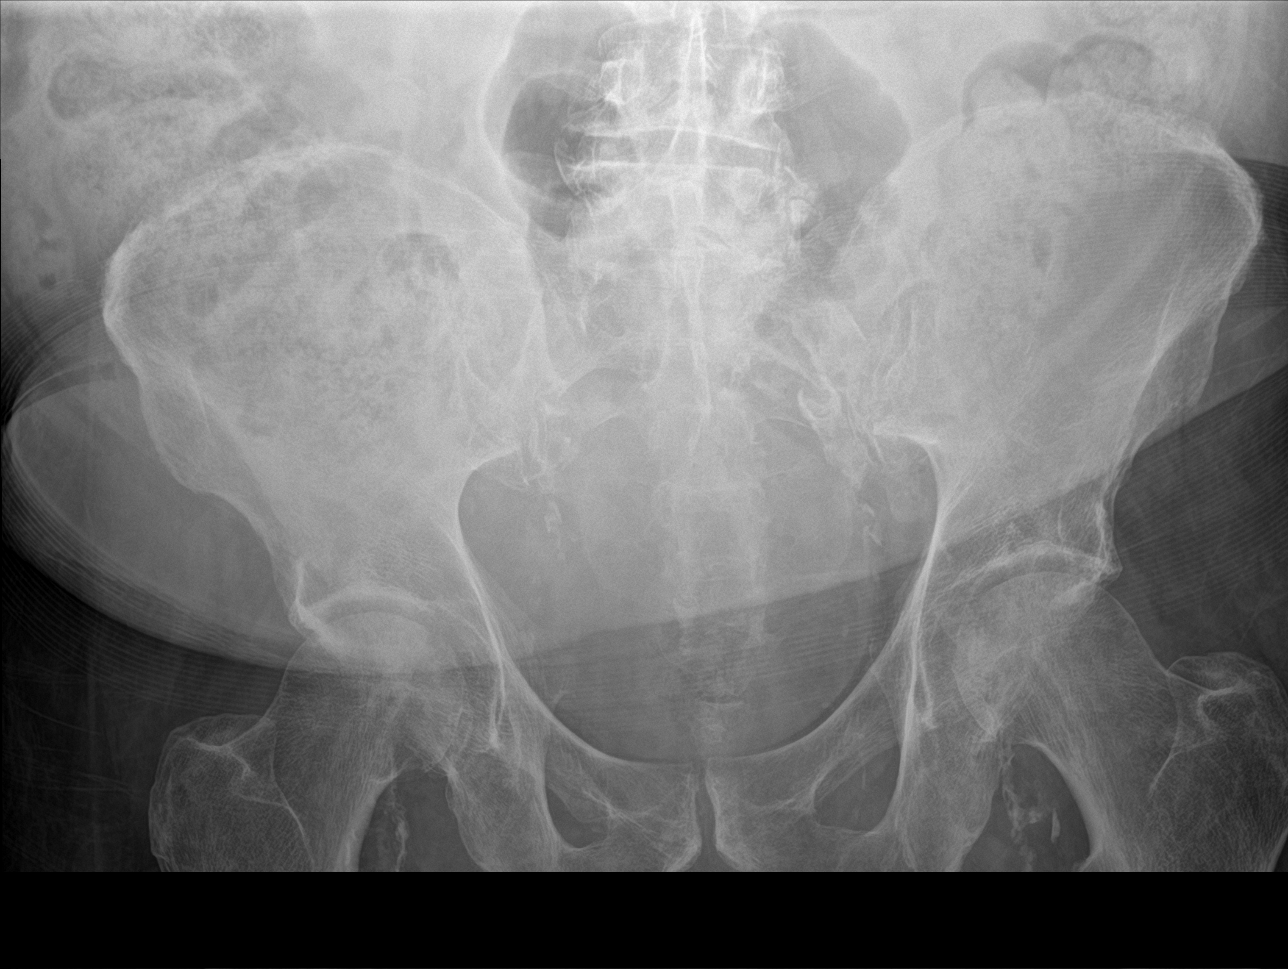

[abdomen supine (2 of 2)]
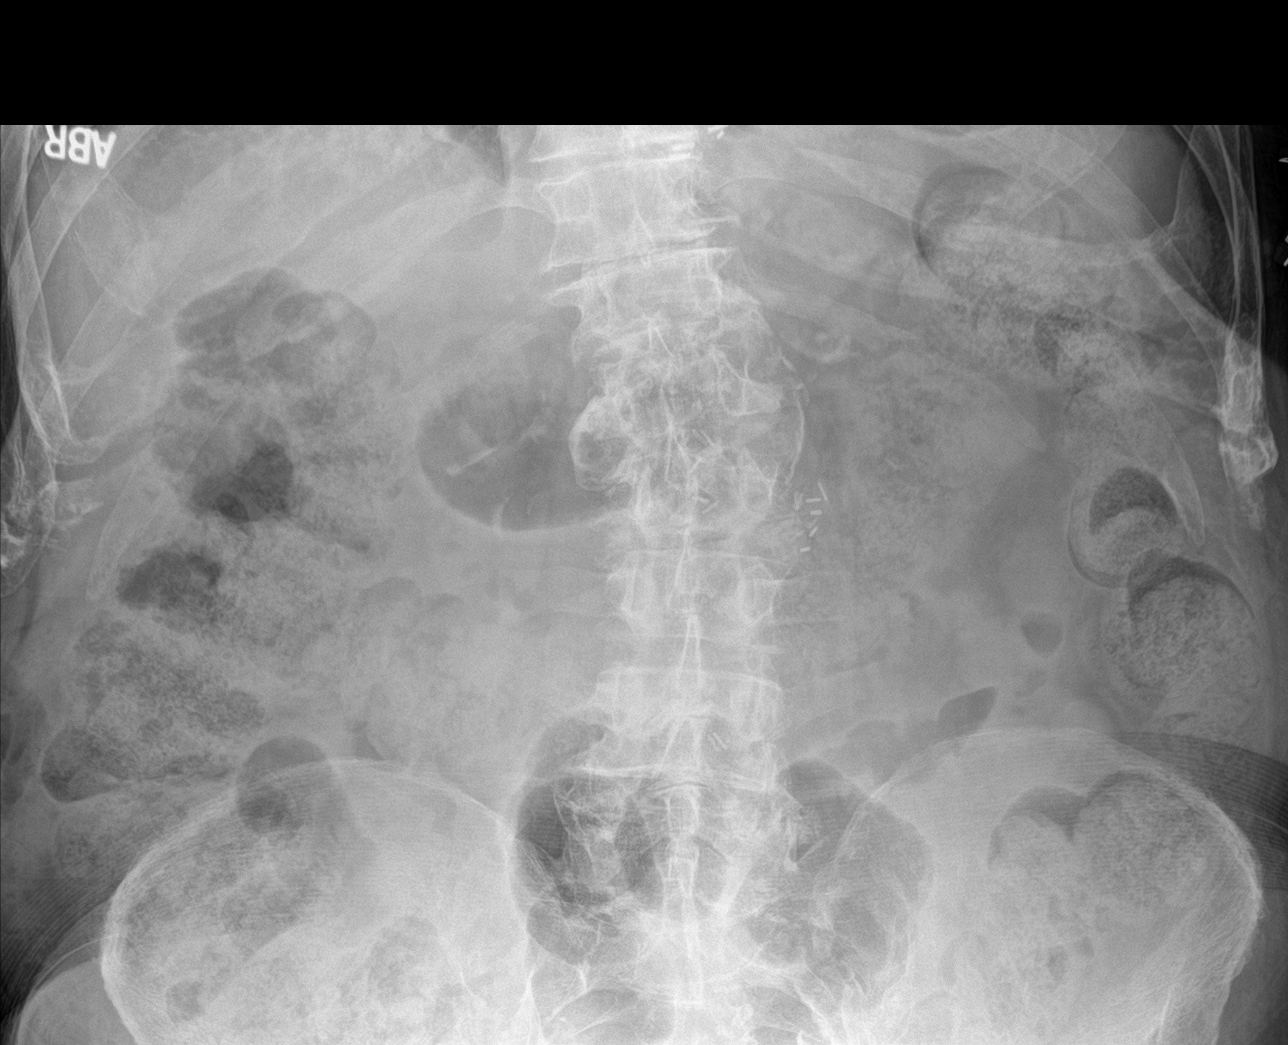

[3 of 3 positions shown; findings below may reference images not displayed]

FINDINGS: Copious amount of dense stool noted throughout the colon. There is
no bowel dilatation or evidence of obstruction. No free air. No
radiopaque calculi. There is advanced osteopenia with extensive
degenerative changes of the spine and scoliosis. No acute fracture.
Multiple surgical clips noted in the upper abdomen.

Bibasilar linear atelectasis/ scarring. A small right pleural
effusion noted. There is median sternotomy wires and CABG vascular
clips. Left pectoral AICD device.
IMPRESSION: Constipation.  No bowel obstruction.
